# Patient Record
Sex: Female | Born: 1945 | Race: Black or African American | Hispanic: No | Smoking: Never smoker
Health system: Southern US, Community
[De-identification: ages and names within clinical notes are randomized; demographics above are authoritative.]

## PROBLEM LIST (undated history)

## (undated) DIAGNOSIS — M1990 Unspecified osteoarthritis, unspecified site: Secondary | ICD-10-CM

## (undated) DIAGNOSIS — K219 Gastro-esophageal reflux disease without esophagitis: Secondary | ICD-10-CM

## (undated) DIAGNOSIS — D649 Anemia, unspecified: Secondary | ICD-10-CM

## (undated) DIAGNOSIS — D219 Benign neoplasm of connective and other soft tissue, unspecified: Secondary | ICD-10-CM

## (undated) DIAGNOSIS — E7800 Pure hypercholesterolemia, unspecified: Secondary | ICD-10-CM

## (undated) DIAGNOSIS — K589 Irritable bowel syndrome without diarrhea: Secondary | ICD-10-CM

## (undated) DIAGNOSIS — K2970 Gastritis, unspecified, without bleeding: Secondary | ICD-10-CM

## (undated) DIAGNOSIS — K5730 Diverticulosis of large intestine without perforation or abscess without bleeding: Secondary | ICD-10-CM

## (undated) DIAGNOSIS — K222 Esophageal obstruction: Secondary | ICD-10-CM

## (undated) DIAGNOSIS — R768 Other specified abnormal immunological findings in serum: Secondary | ICD-10-CM

## (undated) DIAGNOSIS — K449 Diaphragmatic hernia without obstruction or gangrene: Secondary | ICD-10-CM

## (undated) DIAGNOSIS — I10 Essential (primary) hypertension: Secondary | ICD-10-CM

## (undated) HISTORY — DX: Other specified abnormal immunological findings in serum: R76.8

## (undated) HISTORY — PX: CHOLECYSTECTOMY: SHX55

## (undated) HISTORY — DX: Anemia, unspecified: D64.9

## (undated) HISTORY — DX: Gastritis, unspecified, without bleeding: K29.70

## (undated) HISTORY — DX: Diverticulosis of large intestine without perforation or abscess without bleeding: K57.30

## (undated) HISTORY — DX: Pure hypercholesterolemia, unspecified: E78.00

## (undated) HISTORY — PX: UPPER GASTROINTESTINAL ENDOSCOPY: SHX188

## (undated) HISTORY — DX: Essential (primary) hypertension: I10

## (undated) HISTORY — PX: KNEE SURGERY: SHX244

## (undated) HISTORY — DX: Benign neoplasm of connective and other soft tissue, unspecified: D21.9

## (undated) HISTORY — DX: Unspecified osteoarthritis, unspecified site: M19.90

## (undated) HISTORY — DX: Esophageal obstruction: K22.2

## (undated) HISTORY — DX: Irritable bowel syndrome, unspecified: K58.9

## (undated) HISTORY — DX: Gastro-esophageal reflux disease without esophagitis: K21.9

## (undated) HISTORY — DX: Diaphragmatic hernia without obstruction or gangrene: K44.9

---

## 1983-06-17 HISTORY — PX: ABDOMINAL HYSTERECTOMY: SHX81

## 1999-12-10 ENCOUNTER — Encounter: Admission: RE | Admit: 1999-12-10 | Discharge: 1999-12-10 | Payer: Self-pay | Admitting: Obstetrics and Gynecology

## 1999-12-10 ENCOUNTER — Encounter: Payer: Self-pay | Admitting: *Deleted

## 2000-11-05 ENCOUNTER — Other Ambulatory Visit: Admission: RE | Admit: 2000-11-05 | Discharge: 2000-11-05 | Payer: Self-pay | Admitting: *Deleted

## 2000-12-10 ENCOUNTER — Encounter: Payer: Self-pay | Admitting: *Deleted

## 2000-12-10 ENCOUNTER — Ambulatory Visit (HOSPITAL_COMMUNITY): Admission: RE | Admit: 2000-12-10 | Discharge: 2000-12-10 | Payer: Self-pay | Admitting: Obstetrics

## 2001-05-10 ENCOUNTER — Other Ambulatory Visit: Admission: RE | Admit: 2001-05-10 | Discharge: 2001-05-10 | Payer: Self-pay | Admitting: Gynecology

## 2001-05-31 ENCOUNTER — Encounter: Payer: Self-pay | Admitting: Gynecology

## 2001-05-31 ENCOUNTER — Encounter: Admission: RE | Admit: 2001-05-31 | Discharge: 2001-05-31 | Payer: Self-pay | Admitting: Gynecology

## 2001-12-23 ENCOUNTER — Encounter: Admission: RE | Admit: 2001-12-23 | Discharge: 2001-12-23 | Payer: Self-pay | Admitting: *Deleted

## 2001-12-23 ENCOUNTER — Encounter: Payer: Self-pay | Admitting: *Deleted

## 2002-06-03 ENCOUNTER — Other Ambulatory Visit: Admission: RE | Admit: 2002-06-03 | Discharge: 2002-06-03 | Payer: Self-pay | Admitting: Gynecology

## 2002-06-24 ENCOUNTER — Encounter: Payer: Self-pay | Admitting: *Deleted

## 2002-06-24 ENCOUNTER — Encounter: Admission: RE | Admit: 2002-06-24 | Discharge: 2002-06-24 | Payer: Self-pay | Admitting: *Deleted

## 2002-12-29 ENCOUNTER — Encounter: Payer: Self-pay | Admitting: Gynecology

## 2002-12-29 ENCOUNTER — Ambulatory Visit (HOSPITAL_COMMUNITY): Admission: RE | Admit: 2002-12-29 | Discharge: 2002-12-29 | Payer: Self-pay | Admitting: Gynecology

## 2003-06-27 ENCOUNTER — Other Ambulatory Visit: Admission: RE | Admit: 2003-06-27 | Discharge: 2003-06-27 | Payer: Self-pay | Admitting: Gynecology

## 2004-01-01 ENCOUNTER — Ambulatory Visit (HOSPITAL_COMMUNITY): Admission: RE | Admit: 2004-01-01 | Discharge: 2004-01-01 | Payer: Self-pay | Admitting: Gynecology

## 2004-06-18 ENCOUNTER — Ambulatory Visit: Payer: Self-pay | Admitting: Gastroenterology

## 2004-06-28 ENCOUNTER — Other Ambulatory Visit: Admission: RE | Admit: 2004-06-28 | Discharge: 2004-06-28 | Payer: Self-pay | Admitting: Gynecology

## 2004-07-23 ENCOUNTER — Ambulatory Visit: Payer: Self-pay | Admitting: Gastroenterology

## 2005-01-02 ENCOUNTER — Ambulatory Visit (HOSPITAL_COMMUNITY): Admission: RE | Admit: 2005-01-02 | Discharge: 2005-01-02 | Payer: Self-pay | Admitting: General Surgery

## 2005-01-14 ENCOUNTER — Encounter: Admission: RE | Admit: 2005-01-14 | Discharge: 2005-01-14 | Payer: Self-pay | Admitting: General Surgery

## 2005-04-10 ENCOUNTER — Ambulatory Visit: Payer: Self-pay | Admitting: Gastroenterology

## 2005-04-30 ENCOUNTER — Ambulatory Visit: Payer: Self-pay | Admitting: Gastroenterology

## 2005-08-15 ENCOUNTER — Other Ambulatory Visit: Admission: RE | Admit: 2005-08-15 | Discharge: 2005-08-15 | Payer: Self-pay | Admitting: Gynecology

## 2005-08-29 ENCOUNTER — Ambulatory Visit: Payer: Self-pay | Admitting: Gastroenterology

## 2005-09-03 ENCOUNTER — Ambulatory Visit: Payer: Self-pay | Admitting: Gastroenterology

## 2005-09-03 DIAGNOSIS — K222 Esophageal obstruction: Secondary | ICD-10-CM

## 2005-09-03 HISTORY — DX: Esophageal obstruction: K22.2

## 2006-01-06 ENCOUNTER — Ambulatory Visit (HOSPITAL_COMMUNITY): Admission: RE | Admit: 2006-01-06 | Discharge: 2006-01-06 | Payer: Self-pay | Admitting: Gynecology

## 2006-05-21 ENCOUNTER — Ambulatory Visit: Payer: Self-pay | Admitting: Gastroenterology

## 2006-06-23 ENCOUNTER — Ambulatory Visit: Payer: Self-pay | Admitting: Gastroenterology

## 2006-07-08 ENCOUNTER — Ambulatory Visit: Payer: Self-pay | Admitting: Gastroenterology

## 2006-07-08 LAB — CONVERTED CEMR LAB
AFP-Tumor Marker: 6.6 ng/mL (ref 0.0–8.0)
Tissue Transglutaminase Ab, IgA: <3 units (ref <5)

## 2006-07-10 ENCOUNTER — Encounter: Payer: Self-pay | Admitting: Gastroenterology

## 2006-07-16 ENCOUNTER — Ambulatory Visit: Payer: Self-pay | Admitting: Gastroenterology

## 2006-07-16 LAB — CONVERTED CEMR LAB
ALT: 58 units/L — ABNORMAL HIGH (ref 0–40)
AST: 45 units/L — ABNORMAL HIGH (ref 0–37)
Albumin: 3.6 g/dL (ref 3.5–5.2)
Alkaline Phosphatase: 96 units/L (ref 39–117)
Bilirubin, Direct: 0.2 mg/dL (ref 0.0–0.3)
CO2: 24 meq/L (ref 19–32)
Chloride: 108 meq/L (ref 96–112)
Potassium: 4.1 meq/L (ref 3.5–5.1)
Sodium: 141 meq/L (ref 135–145)
Total Bilirubin: 0.7 mg/dL (ref 0.3–1.2)
Total Protein: 7.5 g/dL (ref 6.0–8.3)

## 2006-08-05 ENCOUNTER — Ambulatory Visit: Payer: Self-pay | Admitting: Gastroenterology

## 2006-08-05 LAB — CONVERTED CEMR LAB
Ceruloplasmin: 32 mg/dL (ref 21–63)
Ferritin: 24.6 ng/mL (ref 10.0–291.0)
HCV Ab: NEGATIVE
Hepatitis B Surface Ag: NEGATIVE
Iron: 63 ug/dL (ref 42–145)
Rhuematoid fact SerPl-aCnc: 37.2 intl units/mL — ABNORMAL HIGH (ref 0.0–20.0)
Saturation Ratios: 13.2 % — ABNORMAL LOW (ref 20.0–50.0)
Sed Rate: 29 mm/hr — ABNORMAL HIGH (ref 0–25)
Tissue Transglutaminase Ab, IgA: <3 units (ref <5)
Transferrin: 340.5 mg/dL (ref >=212.0)

## 2006-08-06 ENCOUNTER — Encounter: Payer: Self-pay | Admitting: Gastroenterology

## 2006-08-06 LAB — CONVERTED CEMR LAB
ANA Titer 1: 1:640 {titer} — ABNORMAL HIGH
Anti Nuclear Antibody(ANA): POSITIVE — AB

## 2006-08-28 ENCOUNTER — Ambulatory Visit: Payer: Self-pay | Admitting: Internal Medicine

## 2006-09-08 ENCOUNTER — Ambulatory Visit: Payer: Self-pay | Admitting: Gastroenterology

## 2006-09-08 LAB — CONVERTED CEMR LAB
ALT: 51 units/L — ABNORMAL HIGH (ref 0–40)
AST: 38 units/L — ABNORMAL HIGH (ref 0–37)
Albumin: 3.6 g/dL (ref 3.5–5.2)
Alkaline Phosphatase: 92 units/L (ref 39–117)
Bilirubin, Direct: 0.1 mg/dL (ref 0.0–0.3)
Total Bilirubin: 0.5 mg/dL (ref 0.3–1.2)
Total Protein: 7.9 g/dL (ref 6.0–8.3)

## 2006-09-14 ENCOUNTER — Ambulatory Visit: Payer: Self-pay | Admitting: Internal Medicine

## 2006-10-01 ENCOUNTER — Other Ambulatory Visit: Admission: RE | Admit: 2006-10-01 | Discharge: 2006-10-01 | Payer: Self-pay | Admitting: Obstetrics and Gynecology

## 2006-11-23 ENCOUNTER — Ambulatory Visit: Payer: Self-pay | Admitting: Gastroenterology

## 2006-11-23 LAB — CONVERTED CEMR LAB
ALT: 48 units/L — ABNORMAL HIGH (ref 0–40)
AST: 38 units/L — ABNORMAL HIGH (ref 0–37)
Albumin: 3.5 g/dL (ref 3.5–5.2)
Alkaline Phosphatase: 87 units/L (ref 39–117)
Bilirubin, Direct: 0.1 mg/dL (ref 0.0–0.3)
Total Bilirubin: 0.5 mg/dL (ref 0.3–1.2)
Total Protein: 7.7 g/dL (ref 6.0–8.3)

## 2006-11-26 ENCOUNTER — Ambulatory Visit: Payer: Self-pay | Admitting: Gastroenterology

## 2007-01-08 ENCOUNTER — Ambulatory Visit (HOSPITAL_COMMUNITY): Admission: RE | Admit: 2007-01-08 | Discharge: 2007-01-08 | Payer: Self-pay | Admitting: Obstetrics and Gynecology

## 2007-06-07 ENCOUNTER — Telehealth (INDEPENDENT_AMBULATORY_CARE_PROVIDER_SITE_OTHER): Payer: Self-pay | Admitting: *Deleted

## 2007-06-08 ENCOUNTER — Ambulatory Visit: Payer: Self-pay | Admitting: Internal Medicine

## 2007-06-08 DIAGNOSIS — K219 Gastro-esophageal reflux disease without esophagitis: Secondary | ICD-10-CM | POA: Insufficient documentation

## 2007-06-08 DIAGNOSIS — E119 Type 2 diabetes mellitus without complications: Secondary | ICD-10-CM | POA: Insufficient documentation

## 2007-06-08 DIAGNOSIS — I10 Essential (primary) hypertension: Secondary | ICD-10-CM | POA: Insufficient documentation

## 2007-06-08 DIAGNOSIS — E785 Hyperlipidemia, unspecified: Secondary | ICD-10-CM | POA: Insufficient documentation

## 2007-06-08 DIAGNOSIS — M65839 Other synovitis and tenosynovitis, unspecified forearm: Secondary | ICD-10-CM | POA: Insufficient documentation

## 2007-06-08 DIAGNOSIS — K589 Irritable bowel syndrome without diarrhea: Secondary | ICD-10-CM | POA: Insufficient documentation

## 2007-06-08 DIAGNOSIS — K222 Esophageal obstruction: Secondary | ICD-10-CM | POA: Insufficient documentation

## 2007-06-08 DIAGNOSIS — F411 Generalized anxiety disorder: Secondary | ICD-10-CM | POA: Insufficient documentation

## 2007-06-08 DIAGNOSIS — M65849 Other synovitis and tenosynovitis, unspecified hand: Secondary | ICD-10-CM | POA: Insufficient documentation

## 2007-06-08 DIAGNOSIS — M25519 Pain in unspecified shoulder: Secondary | ICD-10-CM | POA: Insufficient documentation

## 2007-09-23 ENCOUNTER — Ambulatory Visit: Payer: Self-pay | Admitting: Gastroenterology

## 2007-09-23 LAB — CONVERTED CEMR LAB
ALT: 43 units/L — ABNORMAL HIGH (ref 0–35)
AST: 35 units/L (ref 0–37)
Albumin: 3.9 g/dL (ref 3.5–5.2)
Alkaline Phosphatase: 88 units/L (ref 39–117)
Amylase: 157 units/L — ABNORMAL HIGH (ref 27–131)
BUN: 18 mg/dL (ref 6–23)
Basophils Absolute: 0.1 10*3/uL (ref 0.0–0.1)
Basophils Relative: 1.8 % — ABNORMAL HIGH (ref 0.0–1.0)
Bilirubin Urine: NEGATIVE
Bilirubin, Direct: 0.1 mg/dL (ref 0.0–0.3)
CO2: 29 meq/L (ref 19–32)
CRP, High Sensitivity: 0.4
Calcium: 9.4 mg/dL (ref 8.4–10.5)
Chloride: 105 meq/L (ref 96–112)
Creatinine, Ser: 0.9 mg/dL (ref 0.4–1.2)
Crystals: NEGATIVE
Eosinophils Absolute: 0.1 10*3/uL (ref 0.0–0.7)
Eosinophils Relative: 3.2 % (ref 0.0–5.0)
GFR calc Af Amer: 82 mL/min
GFR calc non Af Amer: 67 mL/min
Glucose, Bld: 98 mg/dL (ref 70–99)
HCT: 40.5 % (ref 36.0–46.0)
Hemoglobin, Urine: NEGATIVE
Hemoglobin: 12.8 g/dL (ref 12.0–15.0)
Ketones, ur: NEGATIVE mg/dL
Leukocytes, UA: NEGATIVE
Lipase: 35 units/L (ref 11.0–59.0)
Lymphocytes Relative: 44 % (ref 12.0–46.0)
MCHC: 31.6 g/dL (ref 30.0–36.0)
MCV: 84.7 fL (ref 78.0–100.0)
Monocytes Absolute: 0.1 10*3/uL (ref 0.1–1.0)
Monocytes Relative: 4 % (ref 3.0–12.0)
Mucus, UA: NEGATIVE
Neutro Abs: 1.5 10*3/uL (ref 1.4–7.7)
Neutrophils Relative %: 47 % (ref 43.0–77.0)
Nitrite: NEGATIVE
Platelets: 193 10*3/uL (ref 150–400)
Potassium: 4.1 meq/L (ref 3.5–5.1)
RBC / HPF: NONE SEEN
RBC: 4.78 M/uL (ref 3.87–5.11)
RDW: 13.2 % (ref 11.5–14.6)
Sed Rate: 36 mm/hr — ABNORMAL HIGH (ref 0–22)
Sodium: 143 meq/L (ref 135–145)
Specific Gravity, Urine: >=1.03 (ref 1.000–1.03)
TSH: 0.84 microintl units/mL (ref 0.35–5.50)
Total Bilirubin: 0.5 mg/dL (ref 0.3–1.2)
Total Protein, Urine: NEGATIVE mg/dL
Total Protein: 8.4 g/dL — ABNORMAL HIGH (ref 6.0–8.3)
Urine Glucose: NEGATIVE mg/dL
Urobilinogen, UA: 0.2 (ref 0.0–1.0)
WBC: 3.2 10*3/uL — ABNORMAL LOW (ref 4.5–10.5)
pH: 5.5 (ref 5.0–8.0)

## 2007-09-28 ENCOUNTER — Ambulatory Visit: Payer: Self-pay | Admitting: Cardiology

## 2007-10-11 ENCOUNTER — Telehealth: Payer: Self-pay | Admitting: Gastroenterology

## 2007-10-28 DIAGNOSIS — K2990 Gastroduodenitis, unspecified, without bleeding: Secondary | ICD-10-CM | POA: Insufficient documentation

## 2007-10-28 DIAGNOSIS — K449 Diaphragmatic hernia without obstruction or gangrene: Secondary | ICD-10-CM | POA: Insufficient documentation

## 2007-10-28 DIAGNOSIS — K2970 Gastritis, unspecified, without bleeding: Secondary | ICD-10-CM | POA: Insufficient documentation

## 2007-10-28 DIAGNOSIS — M1990 Unspecified osteoarthritis, unspecified site: Secondary | ICD-10-CM | POA: Insufficient documentation

## 2007-10-28 DIAGNOSIS — I119 Hypertensive heart disease without heart failure: Secondary | ICD-10-CM | POA: Insufficient documentation

## 2007-11-17 ENCOUNTER — Telehealth: Payer: Self-pay | Admitting: Gastroenterology

## 2007-12-01 ENCOUNTER — Ambulatory Visit: Payer: Self-pay | Admitting: Gastroenterology

## 2007-12-15 ENCOUNTER — Encounter: Payer: Self-pay | Admitting: Gastroenterology

## 2007-12-15 ENCOUNTER — Ambulatory Visit: Payer: Self-pay | Admitting: Gastroenterology

## 2007-12-15 HISTORY — PX: COLONOSCOPY: SHX174

## 2007-12-20 ENCOUNTER — Encounter: Payer: Self-pay | Admitting: Gastroenterology

## 2007-12-30 DIAGNOSIS — K5730 Diverticulosis of large intestine without perforation or abscess without bleeding: Secondary | ICD-10-CM | POA: Insufficient documentation

## 2007-12-31 ENCOUNTER — Ambulatory Visit: Payer: Self-pay | Admitting: Gastroenterology

## 2008-01-12 ENCOUNTER — Ambulatory Visit (HOSPITAL_COMMUNITY): Admission: RE | Admit: 2008-01-12 | Discharge: 2008-01-12 | Payer: Self-pay | Admitting: Obstetrics and Gynecology

## 2008-03-21 ENCOUNTER — Ambulatory Visit: Payer: Self-pay | Admitting: Obstetrics and Gynecology

## 2008-03-21 ENCOUNTER — Encounter: Payer: Self-pay | Admitting: Obstetrics and Gynecology

## 2008-03-21 ENCOUNTER — Other Ambulatory Visit: Admission: RE | Admit: 2008-03-21 | Discharge: 2008-03-21 | Payer: Self-pay | Admitting: Obstetrics and Gynecology

## 2008-04-12 ENCOUNTER — Encounter: Payer: Self-pay | Admitting: Gastroenterology

## 2008-04-18 ENCOUNTER — Ambulatory Visit: Payer: Self-pay | Admitting: Obstetrics and Gynecology

## 2008-05-03 ENCOUNTER — Ambulatory Visit: Payer: Self-pay | Admitting: Obstetrics and Gynecology

## 2008-09-08 ENCOUNTER — Telehealth: Payer: Self-pay | Admitting: Gastroenterology

## 2008-10-12 ENCOUNTER — Telehealth: Payer: Self-pay | Admitting: Gastroenterology

## 2009-01-16 ENCOUNTER — Ambulatory Visit (HOSPITAL_COMMUNITY): Admission: RE | Admit: 2009-01-16 | Discharge: 2009-01-16 | Payer: Self-pay | Admitting: Obstetrics and Gynecology

## 2009-01-19 ENCOUNTER — Encounter: Admission: RE | Admit: 2009-01-19 | Discharge: 2009-01-19 | Payer: Self-pay | Admitting: Obstetrics and Gynecology

## 2009-03-22 ENCOUNTER — Ambulatory Visit: Payer: Self-pay | Admitting: Obstetrics and Gynecology

## 2009-03-22 ENCOUNTER — Encounter: Payer: Self-pay | Admitting: Obstetrics and Gynecology

## 2009-03-22 ENCOUNTER — Other Ambulatory Visit: Admission: RE | Admit: 2009-03-22 | Discharge: 2009-03-22 | Payer: Self-pay | Admitting: Obstetrics and Gynecology

## 2009-07-20 ENCOUNTER — Encounter: Admission: RE | Admit: 2009-07-20 | Discharge: 2009-07-20 | Payer: Self-pay | Admitting: Obstetrics and Gynecology

## 2010-01-17 ENCOUNTER — Encounter: Admission: RE | Admit: 2010-01-17 | Discharge: 2010-01-17 | Payer: Self-pay | Admitting: Obstetrics and Gynecology

## 2010-01-31 ENCOUNTER — Telehealth: Payer: Self-pay | Admitting: Gastroenterology

## 2010-03-25 ENCOUNTER — Other Ambulatory Visit: Admission: RE | Admit: 2010-03-25 | Discharge: 2010-03-25 | Payer: Self-pay | Admitting: Obstetrics and Gynecology

## 2010-03-25 ENCOUNTER — Ambulatory Visit: Payer: Self-pay | Admitting: Obstetrics and Gynecology

## 2010-04-02 ENCOUNTER — Ambulatory Visit: Payer: Self-pay | Admitting: Obstetrics and Gynecology

## 2010-04-05 ENCOUNTER — Telehealth: Payer: Self-pay | Admitting: Gastroenterology

## 2010-04-25 ENCOUNTER — Ambulatory Visit: Payer: Self-pay | Admitting: Gastroenterology

## 2010-05-01 ENCOUNTER — Telehealth: Payer: Self-pay | Admitting: Gastroenterology

## 2010-05-06 ENCOUNTER — Telehealth: Payer: Self-pay | Admitting: Gastroenterology

## 2010-06-26 ENCOUNTER — Telehealth: Payer: Self-pay | Admitting: Gastroenterology

## 2010-06-27 ENCOUNTER — Ambulatory Visit
Admission: RE | Admit: 2010-06-27 | Discharge: 2010-06-27 | Payer: Self-pay | Source: Home / Self Care | Attending: Gastroenterology | Admitting: Gastroenterology

## 2010-06-27 ENCOUNTER — Encounter (INDEPENDENT_AMBULATORY_CARE_PROVIDER_SITE_OTHER): Payer: Self-pay | Admitting: *Deleted

## 2010-07-02 ENCOUNTER — Telehealth: Payer: Self-pay | Admitting: Gastroenterology

## 2010-07-09 ENCOUNTER — Telehealth: Payer: Self-pay | Admitting: Gastroenterology

## 2010-07-15 ENCOUNTER — Telehealth: Payer: Self-pay | Admitting: Gastroenterology

## 2010-07-16 NOTE — Letter (Signed)
Summary: Patient [facility] Biopsy Results  [facility]  [address] [place], [place] [address]   Phone: [phone]  Fax: [phone]        December 20, 2007 MRN: [id number]    [place] [address] [place], [place]  [address]    Dear Ms. Hutsell,  I am pleased to inform you that the biopsies taken during your recent endoscopic examination did not show any evidence of cancer upon pathologic examination.  Additional information/recommendations:  __No further action is needed at this time.  Please follow-up with      your primary care physician for your other healthcare needs.  __ Please call [phone] to schedule a return visit to review      your condition.  _xx_ Continue with the treatment plan as outlined on the day of your      exam.  __ You should have a repeat endoscopic examination for thi_s problem              in _ months/years.   Please call us if you are having persistent problems or have questions about your condition that have not been fully answered at this time.  Sincerely,  Mardella Layman MD [facility]  This letter has been electronically signed by your physician.

## 2010-07-16 NOTE — Miscellaneous (Signed)
Summary: Bentyl refill  Clinical Lists Changes  Medications: Added new medication of BENTYL 10 MG  CAPS (DICYCLOMINE HCL) take one by mouth three times a day - Signed Rx of BENTYL 10 MG  CAPS (DICYCLOMINE HCL) take one by mouth three times a day;  #270 x 1;  Signed;  Entered by: Harlow Mares CMA;  Authorized by: Mardella Layman MD [facility];  Method used: Electronically to [facility]  [address] [phone]*, [address] [place], [place]  [address], Ph: [phone] or [phone], Fax: [phone]    Prescriptions: BENTYL 10 MG  CAPS (DICYCLOMINE HCL) take one by mouth three times a day  #270 x 1   Entered by:   Harlow Mares CMA   Authorized by:   Mardella Layman MD FACG,FAGA   Signed by:   Harlow Mares CMA on 04/12/2008   Method used:   Electronically to        [facility]  [address] [phone]* (retail)       [address]       [place], [place]  [address]       Ph: [phone] or [phone]       Fax: [phone]   RxID:   [id number]

## 2010-07-16 NOTE — Procedures (Signed)
Summary: Gastroenterology COLON  Gastroenterology COLON   Imported By: Irma Newness 11/05/2007 14:00:45  _____________________________________________________________________  External Attachment:    Type:   Image     Comment:   External Document

## 2010-07-16 NOTE — Progress Notes (Signed)
Summary: refill  Phone Note Call from Patient Call back at Work Phone [phone]   Caller: Patient Call For: Lexine Jaspers Reason for Call: Refill Medication Summary of Call: Patient needs refill on her Kapidex [facility] [address] Initial call taken by: Tawni Levy,  September 08, 2008 8:40 AM  Follow-up for Phone Call        rx sent patient aware.  Follow-up by: Harlow Mares CMA,  September 08, 2008 8:43 AM      Prescriptions: KAPIDEX 60 MG  CPDR (DEXLANSOPRAZOLE) Take one capsule by mouth 30 minutes before breakfast  #30 x 11   Entered by:   Harlow Mares CMA   Authorized by:   Mardella Layman MD FACG,FAGA   Signed by:   Harlow Mares CMA on 09/08/2008   Method used:   [facility]ronically to        [facility]  [address] [phone]* (retail)       [address]       [place], [place]  [address]       Ph: [phone] or [phone]       Fax: [phone]   RxID:   [id number]

## 2010-07-16 NOTE — Assessment & Plan Note (Signed)
Summary: rev follow up hh / maw   History of Present Illness Visit Type: follow up Primary GI MD: Sheryn Bison MD FACP FAGA Primary Provider: Oliver Barre, M.D. Chief Complaint: f/u H/H  no complaints History of Present Illness:   This patient is much improved on kapidex 60 mg a day for acid reflux confirm our recent endoscopy with multiple esophageal and gastric biopsies. There was no evidence of H. pylori on gastric biopsy examination. She is completely asymptomatic at this time. I discussed with the patient the possibility of future fundoplication depending on clinical course we'll see as she does currently on kapidex. She otherwise denies gastrointestinal problems. She is status post laparoscopic cholecystectomy in July of 1993.   GI Review of Systems      Denies abdominal pain, acid reflux, belching, bloating, chest pain, dysphagia with liquids, dysphagia with solids, heartburn, loss of appetite, nausea, vomiting, vomiting blood, weight loss, and  weight gain.        Denies anal fissure, black tarry stools, change in bowel habit, constipation, diarrhea, diverticulosis, fecal incontinence, heme positive stool, hemorrhoids, irritable bowel syndrome, jaundice, light color stool, liver problems, rectal bleeding, and  rectal pain.     Prior Medications Reviewed Using: Patient Recall  Updated Prior Medication List: DICLOFENAC SODIUM 75 MG  TBEC (DICLOFENAC SODIUM) 1 by mouth two times a day prn LEVSIN 0.125 MG  TABS (HYOSCYAMINE SULFATE) 1 tablet by mouth as needed KLOR-CON 10 10 MEQ  TBCR (POTASSIUM CHLORIDE) 1 by mouth qd BENICAR HCT 40-12.5 MG  TABS (OLMESARTAN MEDOXOMIL-HCTZ) 1 by mouth qd VERAPAMIL HCL CR 240 MG  CP24 (VERAPAMIL HCL) 1 by mouth qd ECOTRIN LOW STRENGTH 81 MG  TBEC (ASPIRIN) 1 by mouth qd KAPIDEX 60 MG  CPDR (DEXLANSOPRAZOLE) Take one capsule by mouth 30 minutes before breakfast ACTOPLUS MET 15-850 MG  TABS (PIOGLITAZONE HCL-METFORMIN HCL) 1 tablet by mouth once  daily MULTIVITAMINS   TABS (MULTIPLE VITAMIN) 1 tablet by mouth once daily  Current Allergies (reviewed today): CODEINE PHOSPHATE (CODEINE PHOSPHATE)  Past Medical History:    Reviewed history from 06/08/2007 and no changes required:       GERD       Hypertension       Diabetes mellitus, type II       Hyperlipidemia       IBS       Anxiety       esophageal stricture       weakly pos RF  Past Surgical History:    Reviewed history from 06/08/2007 and no changes required:       Cholecystectomy,Laproscopic per Dr. Jamey Ripa in 1993.       partial Hysterectomy   Family History:    Reviewed history and no changes required:       No FH of Colon Cancer:       Family History of Breast Cancer:maternal Aunt       Family History of Diabetes: Mother and brother  Social History:    Reviewed history from 06/08/2007 and no changes required:       Never Smoked       Alcohol use-no    Review of Systems  The patient denies allergy/sinus, anemia, anxiety-new, arthritis/joint pain, back pain, blood in urine, breast changes/lumps, change in vision, confusion, cough, coughing up blood, depression-new, fainting, fatigue, fever, headaches-new, hearing problems, heart murmur, heart rhythm changes, itching, menstrual pain, muscle pains/cramps, night sweats, nosebleeds, pregnancy symptoms, shortness of breath, skin rash, sleeping problems, sore throat,  swelling of feet/legs, swollen lymph glands, thirst - excessive , urination - excessive , urination changes/pain, urine leakage, vision changes, and voice change.     Vital Signs:  Patient Profile:   65 Years Old Female Height:     66 inches Weight:      195.13 pounds BMI:     31.61 BSA:     1.98 Pulse rate:   80 / minute Pulse rhythm:   regular BP sitting:   118 / 76  (left arm)  Vitals Entered By: Milford Cage CMA (December 31, 2007 11:20 AM)                  Physical Exam  General:     Well developed, well nourished, no acute  distress.healthy appearing.      Impression & Recommendations:  Problem # 1:  GERD (ICD-530.81) Assessment: Improved Her acid reflux is much improved on a standard anti-reflex regime and daily kapidex. She has had reflux symptoms for some 20 years. I reviewed her reflex regime with her and the possibility that on the plication may be indicated for recurrent stricturing and refractory symptoms. For now, we'll see she does symptomatically with p.r.n. followup while continuing daily PPI therapy.  Problem # 2:  IBS (ICD-564.1) Assessment: Improved She has chronic IBS and seems to be doing well with better diabetic control. Recent colonoscopy exam otherwise unremarkable and did show evidence of diverticulosis but no colonic polyposis or inflammatory colonic process.  Problem # 3:  DIABETES MELLITUS, TYPE II (ICD-250.00) Assessment: Improved continue medications per Dr. Oliver Barre. It seems obvious that with better diabetic control her gastrointestinal symptoms are also better control.   Patient Instructions: 1)  Copy Sent To:Dr. Oliver Barre. 2)  Avoid foods high in acid content (tomatoes, citrus juices, spicy foods). Avoid eating within 3 to 4 hours of lying down or before exercising. Do not over eat; try smaller more frequent meals. Elevate head of bed four inches when sleeping.    ]

## 2010-07-16 NOTE — Procedures (Signed)
Summary: EGD   EGD  Procedure date:  12/15/2007  Findings:      Location: [facility]    Patient Name: [place], [place] MRN: [id number] Procedure Procedures: Panendoscopy (EGD) CPT: 43235.    with biopsy(s)/brushing(s). CPT: [id number].  Personnel: Endoscopist: Vania Rea. Jarold Motto, MD.  Exam Location: Exam performed in Outpatient Clinic. Outpatient  Patient Consent: Procedure, Alternatives, Risks and Benefits discussed, consent obtained, from patient. Consent was obtained by the RN.  Indications Symptoms: Abdominal pain, location: diffuse. Reflux symptoms for 6-10 yrs,  History  Current Medications: Patient is taking a non-steroidal medication. Patient is not currently taking Coumadin.  Medical/Surgical History: Adult Onset Diabetes, Hypertension, Reflux Disease, Hyperlipidemia, Irritable Bowel Syndrome,  Pre-Exam Physical: Performed Dec 15, 2007  Entire physical exam was normal. Cardio- pulmonary exam, Abdominal exam, Extremity exam, Mental status exam WNL.  Comments: Pt. history reviewed/updated, physical exam performed prior to initiation of sedation? yes Exam Exam Info: Maximum depth of insertion Duodenum, intended Duodenum. Patient position: on left side. Duration of exam: 10 minutes. Vocal cords visualized. Gastric retroflexion performed. Images taken. ASA Classification: II. Tolerance: fair, adequate exam.  Sedation Meds: Patient assessed and found to be appropriate for moderate (conscious) sedation. Sedation was managed by the Endoscopist.  Monitoring: BP and pulse monitoring done. Oximetry used. Supplemental O2 given at 2 Liters.  Instrument(s): GIF 160. Serial [id number].   Findings - HIATAL HERNIA: Prolapsing, 6 cms. in length. ICD9: Hernia, Hiatal: 553.3.Comments: FREE PROFUSE REFLUX NOTED....  - ESOPHAGEAL INFLAMMATION: as a result of reflux. Length of inflammation: 5 cm. [place] Classification: Grade A. ICD9: Esophagitis, Reflux: 530.11.   - Normal: Pyloric Sphincter to Duodenal 2nd Portion. Not Seen: Ulcer. Mucosal abnormality.  - MUCOSAL ABNORMALITY: Body to Antrum. Nodularity present. Red spots present. Granular mucosa. Biopsy/Mucosal Abn taken. RUT done, results pending. ICD9: Gastritis, Unspecified: 535.50. Comment: R/O H.PYLORI...   Assessment  Diagnoses: 553.3: Hernia, Hiatal.  535.50: Gastritis, Unspecified. R/O H.pylori.  530.11: Esophagitis, Reflux. GERD.   Events  Unplanned Intervention: No unplanned interventions were required.  Plans Medication(s): Await pathology. Other: Kapidex 60mg  QAM, starting Dec 15, 2007 for indefinitely.   Patient Education: Patient given standard instructions for: Reflux.  Comments: If ++ HP would treat......Marland Kitchen Disposition: After procedure patient sent to recovery. After recovery patient sent home.  Scheduling: Await pathology to schedule patient. Follow-up prn.   cc: Corwin Levins M.D.     Emiliano Dyer M.D.     Edyth Gunnels M.D.     REPORT OF SURGICAL PATHOLOGY   Case #: [id number] Patient Name: [place], [place]. Office Chart Number:  N/A   MRN: [id number] Pathologist: Ferd Hibbs. Colonel Bald, MD DOB/Age  Jul 31, 1945 (Age: 65)    Gender: F Date Taken:  12/15/2007 Date Received: 12/15/2007   FINAL DIAGNOSIS   ***MICROSCOPIC EXAMINATION AND DIAGNOSIS***   STOMACH, RANDOM, BIOPSY:   - MILD CHRONIC GASTRITIS.  NO HELICOBACTER PYLORI, DYSPLASIA OR EVIDENCE OF MALIGNANCY IDENTIFIED.   COMMENT   A Warthin-Starry stain is performed to determine the possibility of the presence of Helicobacter pylori. The Warthin-Starry stain is negative for organisms of Helicobacter pylori. The control(s) stained appropriately.   cc Date Reported:  12/16/2007     [place] Booty B. Colonel Bald, MD *** Electronically Signed Out By [facility] ***     December 20, 2007 MRN: [id number]    [place] [address] [place], [place]  [address]    Dear Ms. Kozakiewicz,  I am pleased to inform you that  the biopsies  taken during your recent endoscopic examination did not show any evidence of cancer upon pathologic examination.  Additional information/recommendations:  __No further action is needed at this time.  Please follow-up with      your primary care physician for your other healthcare needs.  __ Please call [phone] to schedule a return visit to review      your condition.  _xx_ Continue with the treatment plan as outlined on the day of your      exam.  __ You should have a repeat endoscopic examination for thi_s problem              in _ months/years.   Please call us if you are having persistent problems or have questions about your condition that have not been fully answered at this time.  Sincerely,  Mardella Layman MD [facility]  This letter has been electronically signed by your physician.   This report was created from the original endoscopy report, which was reviewed and signed by the above listed endoscopist.

## 2010-07-16 NOTE — Procedures (Signed)
Summary: Colonoscopy   Colonoscopy  Procedure date:  12/15/2007  Findings:      Location:  [facility].    Patient Name: [place], [place] MRN: [id number] Procedure Procedures: Colonoscopy CPT: [phone].  Personnel: Endoscopist: Vania Rea. Jarold Motto, MD.  Exam Location: Exam performed in Outpatient Clinic. Outpatient  Patient Consent: Procedure, Alternatives, Risks and Benefits discussed, consent obtained, from patient. Consent was obtained by the RN.  Indications Symptoms: Abdominal pain / bloating.  History  Current Medications: Patient is taking an non-steroidal medication. Patient is not currently taking Coumadin.  Medical/ Surgical History: Adult Onset Diabetes, Hypertension, Reflux Disease, Hyperlipidemia, Irritable Bowel Syndrome,  Comments: S/P CHOLECYSTECTOMY AND TAH... Pre-Exam Physical: Performed Dec 15, 2007. Entire physical exam was normal. Cardio- pulmonary exam, Rectal exam, Abdominal exam, Extremity exam, Mental status exam WNL.  Comments: Pt. history reviewed/updated, physical exam performed prior to initiation of sedation? YES Exam Exam: Extent of exam reached: Cecum, extent intended: Cecum.  The cecum was identified by appendiceal orifice and IC valve. Patient position: on left side. Time to Cecum: 00:03:35. Time for Withdrawl: 00:07:27. Colon retroflexion performed. Images taken. ASA Classification: II. Tolerance: excellent.  Monitoring: Pulse and BP monitoring, Oximetry used. Supplemental O2 given. at 2 Liters.  Colon Prep Used Golytely for colon prep. Prep results: excellent.  Sedation Meds: Patient assessed and found to be appropriate for moderate (conscious) sedation. Sedation was managed by the Endoscopist. Fentanyl 50 mcg. given IV. Versed 7 mg. given IV.  Instrument(s): CF 140L. Serial [id number].  Findings - NORMAL EXAM: Cecum to Splenic Flexure. Not Seen: Polyps. AVM's. Colitis. Tumors. Crohn's. Diverticulosis.  -  DIVERTICULOSIS: Descending Colon to Sigmoid Colon. Not bleeding. ICD9: Diverticulosis, Colon: 562.10. Comments: Scattered small mouthed tics..  - NORMAL EXAM: Sigmoid Colon to Rectum. Tumors. Crohn's. Hemorrhoids.  - HEMORRHOIDS: Internal. Size: Medium. Not bleeding. Not thrombosed. ICD9: Hemorrhoids, Internal: 455.0.   Assessment  Diagnoses: 562.10: Diverticulosis, Colon. IBS.  455.0: Hemorrhoids, Internal.   Events  Unplanned Interventions: No intervention was required.  Plans Medication Plan: Fiber supplements: Methylcellulose 1 Tbsp QAM, starting Dec 15, 2007 for indefinitely.   Patient Education: Patient given standard instructions for: Diverticulosis. high fiber diet.  Disposition: After procedure patient sent to recovery. After recovery patient sent home.  Scheduling/Referral: Follow-Up prn. EGD, to [facility]. Jarold Motto, MD, on Dec 15, 2007.    cc: Oliver Barre M.D.     Emiliano Dyer M.D.     Edyth Gunnels M.D.  This report was created from the original endoscopy report, which was reviewed and signed by the above listed endoscopist.

## 2010-07-16 NOTE — Assessment & Plan Note (Signed)
Summary: PROBLEMS WITH HER ARM PAIN /DD  Medications Added DICLOFENAC SODIUM 75 MG  TBEC (DICLOFENAC SODIUM) 1 by mouth two times a day prn LEVSIN 0.125 MG  TABS (HYOSCYAMINE SULFATE)  KLOR-CON 10 10 MEQ  TBCR (POTASSIUM CHLORIDE) 1 by mouth qd BENICAR HCT 40-12.5 MG  TABS (OLMESARTAN MEDOXOMIL-HCTZ) 1 by mouth qd VERAPAMIL HCL CR 240 MG  CP24 (VERAPAMIL HCL) 1 by mouth qd ECOTRIN LOW STRENGTH 81 MG  TBEC (ASPIRIN) 1 by mouth qd METFORMIN HCL 500 MG  TABS (METFORMIN HCL) 2 by mouth qd ZEGERID 40-1100 MG  CAPS (OMEPRAZOLE-SODIUM BICARBONATE) 1 by mouth qd ESTRACE 1 MG  TABS (ESTRADIOL) 1 by mouth qd        Preventive Care Screening  Colonoscopy:    Date:  04/30/2005    Next Due:  04/2015    Results:  normal    Vital Signs:  Patient Profile:   65 Years Old Female Weight:      197 pounds Temp:     98.7 degrees F Pulse rate:   85 / minute BP sitting:   152 / 88  (right arm) Cuff size:   regular  Pt. in pain?   no  Vitals Entered By: Maris Berger (June 08, 2007 3:13 PM)                  Chief Complaint:  bursitis right arm.  History of Present Illness: here with diffuse puffiness and swelling of the left hand and distal arm; types all day at work; also some vague right shoulder achiness - has FROM and some improvement today  Current Allergies (reviewed today): CODEINE PHOSPHATE (CODEINE PHOSPHATE) Updated/Current Medications (including changes made in today's visit):  DICLOFENAC SODIUM 75 MG  TBEC (DICLOFENAC SODIUM) 1 by mouth two times a day prn LEVSIN 0.125 MG  TABS (HYOSCYAMINE SULFATE)  KLOR-CON 10 10 MEQ  TBCR (POTASSIUM CHLORIDE) 1 by mouth qd BENICAR HCT 40-12.5 MG  TABS (OLMESARTAN MEDOXOMIL-HCTZ) 1 by mouth qd VERAPAMIL HCL CR 240 MG  CP24 (VERAPAMIL HCL) 1 by mouth qd ECOTRIN LOW STRENGTH 81 MG  TBEC (ASPIRIN) 1 by mouth qd METFORMIN HCL 500 MG  TABS (METFORMIN HCL) 2 by mouth qd ZEGERID 40-1100 MG  CAPS (OMEPRAZOLE-SODIUM BICARBONATE) 1  by mouth qd ESTRACE 1 MG  TABS (ESTRADIOL) 1 by mouth qd   Past Medical History:    Reviewed history and no changes required:       GERD       Hypertension       Diabetes mellitus, type II       Hyperlipidemia       IBS       Anxiety       esophageal stricture       weakly pos RF  Past Surgical History:    Reviewed history and no changes required:       Cholecystectomy   Social History:    Reviewed history and no changes required:       Never Smoked       Alcohol use-no   Risk Factors:  Tobacco use:  never Alcohol use:  no  Colonoscopy History:     Date of Last Colonoscopy:  04/30/2005    Results:  normal     Physical Exam  General:     Well-developed,well-nourished,in no acute distress; alert,appropriate and cooperative throughout examination Head:     Normocephalic and atraumatic without obvious abnormalities. No apparent alopecia or balding. Eyes:  No corneal or conjunctival inflammation noted. EOMI. Perrla.  Ears:     External ear exam shows no significant lesions or deformities.  Otoscopic examination reveals clear canals, tympanic membranes are intact bilaterally without bulging, retraction, inflammation or discharge. Hearing is grossly normal bilaterally. Nose:     External nasal examination shows no deformity or inflammation. Nasal mucosa are pink and moist without lesions or exudates. Mouth:     Oral mucosa and oropharynx without lesions or exudates.  Teeth in good repair. Neck:     No deformities, masses, or tenderness noted. Lungs:     Normal respiratory effort, chest expands symmetrically. Lungs are clear to auscultation, no crackles or wheezes. Heart:     Normal rate and regular rhythm. S1 and S2 normal without gallop, murmur, click, rub or other extra sounds. Msk:     some diffuse puffiness and tenderness of left hand diffusely without cellulitis, right shoulder from, NT Extremities:     no edema    Impression &  Recommendations:  Problem # 1:  OTHER TENOSYNOVITIS OF HAND AND WRIST (ICD-727.05) c/w overuse - d/w pt, reassured, educated, tx with nsaid prn  Problem # 2:  SHOULDER PAIN, RIGHT (ICD-719.41)  Her updated medication list for this problem includes:    Diclofenac Sodium 75 Mg Tbec (Diclofenac sodium) .Marland Kitchen... 1 by mouth two times a day prn    Ecotrin Low Strength 81 Mg Tbec (Aspirin) .Marland Kitchen... 1 by mouth qd  exam o/w benign - ok to tx as above, f/u prn  Problem # 3:  HYPERTENSION (ICD-401.9) o/w stable, cont meds as is Her updated medication list for this problem includes:    Benicar Hct 40-12.5 Mg Tabs (Olmesartan medoxomil-hctz) .Marland Kitchen... 1 by mouth qd    Verapamil Hcl Cr 240 Mg Cp24 (Verapamil hcl) .Marland Kitchen... 1 by mouth qd   Problem # 4:  DIABETES MELLITUS, TYPE II (ICD-250.00) cont meds - sees dr Juliane Lack - endo - has f/u next few months Her updated medication list for this problem includes:    Benicar Hct 40-12.5 Mg Tabs (Olmesartan medoxomil-hctz) .Marland Kitchen... 1 by mouth qd    Ecotrin Low Strength 81 Mg Tbec (Aspirin) .Marland Kitchen... 1 by mouth qd    Metformin Hcl 500 Mg Tabs (Metformin hcl) .Marland Kitchen... 2 by mouth qd   Complete Medication List: 1)  Diclofenac Sodium 75 Mg Tbec (Diclofenac sodium) .Marland Kitchen.. 1 by mouth two times a day prn 2)  Levsin 0.125 Mg Tabs (Hyoscyamine sulfate) 3)  Klor-con 10 10 Meq Tbcr (Potassium chloride) .Marland Kitchen.. 1 by mouth qd 4)  Benicar Hct 40-12.5 Mg Tabs (Olmesartan medoxomil-hctz) .Marland Kitchen.. 1 by mouth qd 5)  Verapamil Hcl Cr 240 Mg Cp24 (Verapamil hcl) .Marland Kitchen.. 1 by mouth qd 6)  Ecotrin Low Strength 81 Mg Tbec (Aspirin) .Marland Kitchen.. 1 by mouth qd 7)  Metformin Hcl 500 Mg Tabs (Metformin hcl) .... 2 by mouth qd 8)  Zegerid 40-1100 Mg Caps (Omeprazole-sodium bicarbonate) .Marland Kitchen.. 1 by mouth qd 9)  Estrace 1 Mg Tabs (Estradiol) .Marland Kitchen.. 1 by mouth qd   Patient Instructions: 1)  take the new anti-inflammatory as needed for pain 2)  continue all other medications as prescribed 3)  see dr Lucianne Muss 2/09 as planned for the  diabetes and cholesterol 4)  Please schedule a follow-up appointment as needed.    Prescriptions: DICLOFENAC SODIUM 75 MG  TBEC (DICLOFENAC SODIUM) 1 by mouth two times a day prn  #60 x 5   Entered and Authorized by:   Corwin Levins MD  Signed by:   Corwin Levins MD on 06/08/2007   Method used:   Print then Give to Patient   RxID:   [id number]  ]

## 2010-07-16 NOTE — Progress Notes (Signed)
Summary: SCHED PROCEDURE??  Phone Note Call from Patient Call back at [facility] Phone [phone] Call back at Work Phone [phone]   Caller: Patient Call For: Adryan Shin Summary of Call: Matheu Ploeger PT.  PT ASKING TO SPEAK W/LEISHA.  STATES LEISHA TOLD HER TO CALL WHEN SHE WAS READY TO SCHEDULE A PROCEDURE. ???  PATIENT'S CHART HAS BEEN REQUESTED Initial call taken by: Magdalen Spatz [facility],  November 17, 2007 3:55 PM  Follow-up for Phone Call        Left message on patients machine to call back, schedule an endo/colon Follow-up by: Harlow Mares CMA,  November 17, 2007 4:22 PM

## 2010-07-16 NOTE — Assessment & Plan Note (Signed)
Summary: follow up gerd and change ppi/lk   History of Present Illness Visit Type: follow up Primary GI MD: Sheryn Bison MD FACP FAGA Primary Provider: Oliver Barre, M.D. Requesting Provider: na Chief Complaint: F/u GERD, and pt wants new PPI because Dexilant is to expensive and patient has been taking Omeprazole OTC and states that it is not helping still having GERD History of Present Illness:   Daisy Dillon is doing well from a gastrointestinal standpoint. She comes in today for checkup and also discussion of her medications per her Medicare status. She currently is on omeprazole 20 mg a day and continues with some reflux symptoms. She was much better on Dexilant but cannot afford this drug. She also does well with dicyclomine 10 mg t.i.d. for her IBS.  She currently denies dysphagia, diarrhea, rectal bleeding, melena, or any hepatobiliary complaints. Appetite is good and her weight is stable. She has had previous cholecystectomy.   GI Review of Systems    Reports acid reflux and  heartburn.      Denies abdominal pain, belching, bloating, chest pain, dysphagia with liquids, dysphagia with solids, loss of appetite, nausea, vomiting, vomiting blood, weight loss, and  weight gain.        Denies anal fissure, black tarry stools, change in bowel habit, constipation, diarrhea, diverticulosis, fecal incontinence, heme positive stool, hemorrhoids, irritable bowel syndrome, jaundice, light color stool, liver problems, rectal bleeding, and  rectal pain.    Current Medications (verified): 1)  Benicar Hct 40-12.5 Mg  Tabs (Olmesartan Medoxomil-Hctz) .Marland Kitchen.. 1 By Mouth Qd 2)  Verapamil Hcl Cr 240 Mg  Cp24 (Verapamil Hcl) .Marland Kitchen.. 1 By Mouth Qd 3)  Ecotrin Low Strength 81 Mg  Tbec (Aspirin) .Marland Kitchen.. 1 By Mouth Qd 4)  Actoplus Met 15-850 Mg  Tabs (Pioglitazone Hcl-Metformin Hcl) .Marland Kitchen.. 1 Tablet By Mouth Once Daily 5)  Multivitamins   Tabs (Multiple Vitamin) .Marland Kitchen.. 1 Tablet By Mouth Once Daily 6)  Hyoscyamine Sulfate 0.125  Mg Subl (Hyoscyamine Sulfate) .Marland Kitchen.. 1 Sl Q 4-6 Hrs As Needed 7)  Omeprazole Magnesium 20.6 (20 Base) Mg Cpdr (Omeprazole Magnesium) .... One Tablet By Mouth Once Daily 8)  Vitamin D3 2000 Unit Caps (Cholecalciferol) .... One Capsule By Mouth Once Daily 9)  Magnesium 500 Mg Tabs (Magnesium) .... One Tablet By Mouth Once Daily 10)  Pravastatin Sodium 20 Mg Tabs (Pravastatin Sodium) .... One Tablet By Mouth Once Daily 11)  Triamterene-Hctz 37.5-25 Mg Tabs (Triamterene-Hctz) .... One Tablet By Mouth Once Daily  Allergies (verified): 1)  Codeine Phosphate (Codeine Phosphate)  Past History:  Past medical, surgical, family and social histories (including risk factors) reviewed for relevance to current acute and chronic problems.  Past Medical History: Anxiety esophageal stricture DIVERTICULOSIS, COLON (ICD-562.10) DEGENERATIVE JOINT DISEASE (ICD-715.90) HYPERTENSIVE CARDIOVASCULAR DISEASE (ICD-402.90) GASTRITIS (ICD-535.50) HIATAL HERNIA (ICD-553.3) SHOULDER PAIN, RIGHT (ICD-719.41) OTHER TENOSYNOVITIS OF HAND AND WRIST (ICD-727.05) ESOPHAGEAL STRICTURE (ICD-530.3) IBS (ICD-564.1) ANXIETY (ICD-300.00) HYPERLIPIDEMIA (ICD-272.4) DIABETES MELLITUS, TYPE II (ICD-250.00) HYPERTENSION (ICD-401.9) GERD (ICD-530.81)   weakly pos RF  Past Surgical History: Reviewed history from 12/31/2007 and no changes required. Cholecystectomy,Laproscopic per Dr. Jamey Ripa in 1993. partial Hysterectomy  Family History: Reviewed history from 12/31/2007 and no changes required. No FH of Colon Cancer: Family History of Breast Cancer:maternal Aunt Family History of Diabetes: Mother and brother  Social History: Reviewed history from 06/08/2007 and no changes required. Retired Never Smoked Alcohol use-no  Review of Systems       The patient complains of arthritis/joint pain.  The patient denies allergy/sinus,  anemia, anxiety-new, back pain, blood in urine, breast changes/lumps, change in vision,  confusion, cough, coughing up blood, depression-new, fainting, fatigue, fever, headaches-new, hearing problems, heart murmur, heart rhythm changes, itching, menstrual pain, muscle pains/cramps, night sweats, nosebleeds, pregnancy symptoms, shortness of breath, skin rash, sleeping problems, sore throat, swelling of feet/legs, swollen lymph glands, thirst - excessive , urination - excessive , urination changes/pain, urine leakage, vision changes, and voice change.    Vital Signs:  Patient profile:   65 year old female Height:      66 inches Weight:      195 pounds BMI:     31.59 BSA:     1.98 Pulse rate:   88 / minute Pulse rhythm:   regular BP sitting:   132 / 64  (left arm) Cuff size:   regular  Vitals Entered By: Ok Anis CMA (April 25, 2010 10:48 AM)  Physical Exam  General:  Well developed, well nourished, no acute distress.healthy appearing.   Head:  Normocephalic and atraumatic. Eyes:  PERRLA, no icterus.exam deferred to patient's ophthalmologist.   Lungs:  Clear throughout to auscultation. Heart:  Regular rate and rhythm; no murmurs, rubs,  or bruits. Abdomen:  Soft, nontender and nondistended. No masses, hepatosplenomegaly or hernias noted. Normal bowel sounds. Rectal:  deferred Extremities:  No clubbing, cyanosis, edema or deformities noted. Neurologic:  Alert and  oriented x4;  grossly normal neurologically. Psych:  Alert and cooperative. Normal mood and affect.   Impression & Recommendations:  Problem # 1:  DIVERTICULOSIS, COLON (ICD-562.10) Assessment Improved Continue high-fiber diet as tolerated with t.i.d. dicyclomine 10 mg before meals. She is up-to-date on her colonoscopy exams.  Problem # 2:  GERD (ICD-530.81) Assessment: Deteriorated Generic omeprazole 40 mg twice a day 30 minutes before meals. It appears that she needs b.i.d. therapy to control her symptomatology, and she has a history of recurrent esophageal strictures.  Problem # 3:  SHOULDER PAIN,  RIGHT (ICD-719.41) Assessment: Improved Conventional doses of NSAID should be well tolerated if needed.  Patient Instructions: 1)  Your prescriptions have been sent to your pharmacy for the increase in omeprazole 40mg  two times a day and dicyclomine. 2)  Stop Hyoscyamine.  3)  Copy sent to : Oliver Barre, MD 4)  The medication list was reviewed and reconciled.  All changed / newly prescribed medications were explained.  A complete medication list was provided to the patient / caregiver. 5)  Please continue current medications.  6)  Avoid foods high in acid content ( tomatoes, citrus juices, spicy foods) . Avoid eating within 3 to 4 hours of lying down or before exercising. Do not over eat; try smaller more frequent meals. Elevate head of bed four inches when sleeping.  7)  Diet should be high in fiber ( fruits, vegetables, whole grains) but low in residue. Drink at least eight (8) glasses of water a day.  Prescriptions: DICYCLOMINE HCL 10 MG CAPS (DICYCLOMINE HCL) one capsule by mouth three times a day before meals  #90 x 11   Entered by:   Christie Nottingham CMA (AAMA)   Authorized by:   Mardella Layman MD [facility]   Signed by:   Christie Nottingham CMA (AAMA) on 04/25/2010   Method used:   [facility]  [address] [id number]       [address]       [place], [place]  [address]  Ph: [phone] or [phone]       Fax: [phone]   RxID:   [id number] OMEPRAZOLE 40 MG CPDR (OMEPRAZOLE) one capsule by mouth two times a day  #60 x 11   Entered by:   Christie Nottingham CMA (AAMA)   Authorized by:   Mardella Layman MD [facility]   Signed by:   Christie Nottingham CMA (AAMA) on 04/25/2010   Method used:   [facility]  [address] [id number]       [address]       [place], [place]  [address]       Ph: [phone] or [phone]       Fax: [phone]   RxID:   [id number]

## 2010-07-16 NOTE — Progress Notes (Signed)
       21  22  23  24  25  Allergies Added: CODEINE PHOSPHATE (CODEINE PHOSPHATE) Phone Note Call from Patient Call back at Home Phone [phone]   Caller: [phone] Call For: dr Jonny Ruiz Summary of Call: pt c/o right arm hurt when she rises up and pick up anything Patient's chart has been requested.  Initial call taken by: Shelbie Proctor,  June 07, 2007 8:34 AM  Follow-up for Phone Call        I suspect some kind of muscle, tendon or bursitis problem - consider OV but for now can take tylenol or alleve prn Follow-up by: Corwin Levins MD,  June 07, 2007 9:41 AM  Additional Follow-up for Phone Call Additional follow up Details #1::        Phone Call Completed, Provider Notified, Appt Scheduled TUE  AT 2:45 spoke with pt made aware inform her of dr Jonny Ruiz response June 07, 2007 10:10 AM  Additional Follow-up by: Shelbie Proctor,  June 07, 2007 10:10 AM   New Allergies: CODEINE PHOSPHATE (CODEINE PHOSPHATE) New Allergies: CODEINE PHOSPHATE (CODEINE PHOSPHATE)

## 2010-07-16 NOTE — Progress Notes (Signed)
Summary: CT RESULTS  Medications Added GLYCOPYRROLATE 2 MG TABS (GLYCOPYRROLATE)  HYOSCYAMINE SULFATE 0.125 MG SUBL (HYOSCYAMINE SULFATE) Place 1 tablet under tongue every four to six hours       21  22  23  24  25   Phone Note Call from Patient Call back at Home Phone [phone]   Caller: Patient Call For: Dorn Hartshorne Reason for Call: Lab or Test Results Summary of Call: WOULD LIKE CT SCAN RESULT 2WEEKS AGO/Jami Ohlin/PATIENT'S CHART HAS BEEN REQUESTED Initial call taken by: Guadlupe [facility],  October 11, 2007 12:21 PM  Follow-up for Phone Call        Pt advised of ct results, and she states that still having same pain just happening more frequently now. Pt is taking Levsin every 4-6 hours with our any help. What else can she do?  Follow-up by: Harlow Mares CMA,  October 11, 2007 1:39 PM  Additional Follow-up for Phone Call Additional follow up Details #1::        Schedule  endo/colon   Additional Follow-up by: Mardella Layman MD,  October 11, 2007 4:36 PM    Additional Follow-up for Phone Call Additional follow up Details #2::    advised pt needs to sch endo/colon, pt will cb with day she can have procedure done next week. pt will need previsit and endo/colon.  Called pt number says cant leave a message to cb at a later time   Harlow Mares CMA  October 13, 2007 10:23 AM Called pt number says cant leave a message to cb at a later time  Harlow Mares CMA  October 14, 2007 2:59 PM Called pt number says cant leave a message to cb at a later time  Harlow Mares [facility]  October 14, 2007 4:36 PM  Multiple attempts to contact pt also called the pts work number and they said she just left for the day. If pt calls back we will schedule a endo/colon.Marland KitchenMarland KitchenMarland KitchenHarlow Mares CMA  October 14, 2007 4:37 PM  Follow-up by: Harlow Mares CMA,  October 11, 2007 4:51 PM  New/Updated Medications: GLYCOPYRROLATE 2 MG TABS (GLYCOPYRROLATE)  HYOSCYAMINE SULFATE 0.125 MG SUBL (HYOSCYAMINE SULFATE) Place 1  tablet under tongue every four to six hours

## 2010-07-16 NOTE — Progress Notes (Signed)
Summary: Question about meds  Phone Note Call from Patient Call back at Home Phone [phone]   Call For: Dr Jarold Motto Reason for Call: Refill Medication Summary of Call: Wants to ask nurse question about her meds Initial call taken by: Leanor Kail [facility],  May 06, 2010 8:40 AM  Follow-up for Phone Call        pt rx changed to generic Protonix. She will call back if she needs anything eles.  Follow-up by: Harlow Mares CMA [facility]),  May 06, 2010 1:47 PM    New/Updated Medications: PANTOPRAZOLE SODIUM 40 MG TBEC (PANTOPRAZOLE SODIUM) take one by mouth once daily Prescriptions: PANTOPRAZOLE SODIUM 40 MG TBEC (PANTOPRAZOLE SODIUM) take one by mouth once daily  #30 x 1   Entered by:   Harlow Mares CMA (AAMA)   Authorized by:   Mardella Layman MD [facility]   Signed by:   Harlow Mares CMA (AAMA) on 05/06/2010   Method used:   [facility]  [address] [id number]       [address]       [place], [place]  [address]       Ph: [phone] or [phone]       Fax: [phone]   RxID:   [id number]

## 2010-07-16 NOTE — Progress Notes (Signed)
Summary: Triage  Phone Note Call from Patient Call back at Home Phone [phone]   Caller: Patient Call For: Dr. Jarold Motto Reason for Call: Talk to Nurse Summary of Call: pt. doesn't feel like her Bentyl med. is as affective Initial call taken by: Karna Christmas,  January 31, 2010 9:01 AM  Follow-up for Phone Call        Pt has been taking Bentyl three times a day for a while now.  Pt asks if there is anything else she can try that would work better for her IBS.  Pt cont's to have cramping and diarrhea.  Levsin is listed on med list but pt states she has not tried this medication. Follow-up by: Ashok Cordia RN,  January 31, 2010 11:34 AM  Additional Follow-up for Phone Call Additional follow up Details #1::        as needed  Levsin is ok Additional Follow-up by: Mardella Layman MD Clementeen Graham,  January 31, 2010 11:38 AM    Additional Follow-up for Phone Call Additional follow up Details #2::    Pt notified.  rx sent. Follow-up by: Ashok Cordia RN,  January 31, 2010 12:19 PM  New/Updated Medications: HYOSCYAMINE SULFATE 0.125 MG SUBL (HYOSCYAMINE SULFATE) 1 SL q 4-6 hrs as needed Prescriptions: HYOSCYAMINE SULFATE 0.125 MG SUBL (HYOSCYAMINE SULFATE) 1 SL q 4-6 hrs as needed  #90 x 6   Entered by:   Ashok Cordia RN   Authorized by:   Mardella Layman MD [facility]   Signed by:   Ashok Cordia RN on 01/31/2010   Method used:   [facility]  [address] [id number]       [address]       [place], [place]  [address]       Ph: [phone] or [phone]       Fax: [phone]   RxID:   [id number]

## 2010-07-16 NOTE — Procedures (Signed)
Summary: Gastroenterology EGD  Gastroenterology EGD   Imported By: Irma Newness 11/05/2007 14:01:05  _____________________________________________________________________  External Attachment:    Type:   Image     Comment:   External Document

## 2010-07-16 NOTE — Miscellaneous (Signed)
Summary: GI Previsit  Clinical Lists Changes  Medications: Added new medication of MOVIPREP 100 GM  SOLR (PEG-KCL-NACL-NASULF-NA ASC-C) As per prep instructions. - Signed Rx of MOVIPREP 100 GM  SOLR (PEG-KCL-NACL-NASULF-NA ASC-C) As per prep instructions.;  #1 x 0;  Signed;  Entered by: Barton Fanny RN;  Authorized by: Mardella Layman MD FACG,FAGA;  Method used: Electronic Allergies: Changed allergy or adverse reaction from CODEINE PHOSPHATE (CODEINE PHOSPHATE) to CODEINE PHOSPHATE (CODEINE PHOSPHATE)    Prescriptions: MOVIPREP 100 GM  SOLR (PEG-KCL-NACL-NASULF-NA ASC-C) As per prep instructions.  #1 x 0   Entered by:   Barton Fanny RN   Authorized by:   Mardella Layman MD [facility]   Signed by:   Barton Fanny RN on 12/01/2007   Method used:   Elect[facility] to ...       [facility]  [address] [phone][facility]       [address]       [place], [place]  [address]       Ph: [phone] or [phone]       Fax: [phone]   RxID:   [id number]

## 2010-07-16 NOTE — Progress Notes (Signed)
Summary: samples  Phone Note Call from Patient Call back at Home Phone [phone]   Caller: Patient Call For: Amora Sheehy Reason for Call: Refill Medication, Talk to Nurse Summary of Call: Patient would like some samples of kapidex Initial call taken by: Tawni Levy,  October 12, 2008 10:30 AM  Follow-up for Phone Call        samples up front.  Follow-up by: Harlow Mares CMA,  October 12, 2008 10:34 AM    New/Updated Medications: KAPIDEX 60 MG  CPDR (DEXLANSOPRAZOLE) Take one capsule by mouth 30 minutes before breakfast

## 2010-07-16 NOTE — Progress Notes (Signed)
Summary: Dexilant too expensive  Phone Note Call from Patient Call back at Home Phone [phone]   Call For: Dr Jarold Motto Summary of Call: Can not afford Dexilant and would like something diffrent. Initial call taken by: Leanor Kail [facility],  April 05, 2010 11:06 AM  Follow-up for Phone Call        pt not seen since 2009, i have advised her if she needs medications she will need to be seen in the office, ov 11/10 and she can buy prilosec otc until she is seen. Follow-up by: Harlow Mares CMA [facility]ull),  April 05, 2010 11:40 AM

## 2010-07-16 NOTE — Progress Notes (Signed)
Summary: Medication  Phone Note Call from Patient Call back at Home Phone [phone]   Caller: Patient Call For: Dr. Jarold Motto Reason for Call: Talk to Nurse Summary of Call: pt. has some questions about her med. list Initial call taken by: Karna Christmas,  May 01, 2010 10:44 AM  Follow-up for Phone Call        pt looked over her meds once she got home and relized there were some corrections that needed to be made, I have made them. Follow-up by: Harlow Mares CMA (AAMA),  May 01, 2010 11:01 AM    New/Updated Medications: BENICAR 40 MG TABS (OLMESARTAN MEDOXOMIL) take one by mouth once daily VERAPAMIL HCL CR 240 MG  CP24 (VERAPAMIL HCL) take 1.5 by mouth once daily TRIAMTERENE-HCTZ 37.5-25 MG TABS (TRIAMTERENE-HCTZ) take 1.5 by mouth once daily DICYCLOMINE HCL 10 MG CAPS (DICYCLOMINE HCL) one capsule by mouth three times a day before meals, as needed

## 2010-07-18 NOTE — Progress Notes (Signed)
Summary: Medication  Phone Note Call from Patient Call back at Home Phone [phone]   Caller: Patient Call For: Dr. Jarold Motto Reason for Call: Talk to Nurse Summary of Call: Insurance  is not going to cover her dexilant and she wants to discuss other options Initial call taken by: [place] Johnson,  July 02, 2010 9:55 AM  Follow-up for Phone Call        Left a message on patients machine to call back. patient has been on Zegerd, Protonix, omeprazole in the past.  Follow-up by: Harlow Mares CMA [facility]),  July 02, 2010 10:51 AM  Additional Follow-up for Phone Call Additional follow up Details #1::        spoke to pt changed her to Protonix she does not rememebr why she stopped it before. She will try it again and call back as to  if it works or not.  Additional Follow-up by: Harlow Mares CMA (AAMA),  July 02, 2010 5:07 PM    New/Updated Medications: PANTOPRAZOLE SODIUM 40 MG TBEC (PANTOPRAZOLE SODIUM) take one by mouth once daily Prescriptions: PANTOPRAZOLE SODIUM 40 MG TBEC (PANTOPRAZOLE SODIUM) take one by mouth once daily  #30 x 0   Entered by:   Harlow Mares CMA (AAMA)   Authorized by:   Mardella Layman MD [facility]   Signed by:   Harlow Mares CMA (AAMA) on 07/02/2010   Method used:   [facility]  [address] [id number]       [address]       [place], [place]  [address]       Ph: [phone] or [phone]       Fax: [phone]   RxID:   [id number]

## 2010-07-18 NOTE — Progress Notes (Signed)
Summary: [place]  Phone Note Call from Patient Call back at Home Phone [phone]   Caller: Patient Call For: Dr. Jarold Motto Reason for Call: Talk to Nurse Summary of Call: Pts acid reflux is bothering her and she wants to speak with nurse Initial call taken by: [place] Johnson,  June 26, 2010 8:49 AM  Follow-up for Phone Call        Patient reports her Acid Reflux is acting up. She reports she wakes up and it feels as though something's in her throat, she has a "sour taste and sour stomach", and she gags. Patient is on Pantoprazole daily; she was on Dexilant w/o problems until she went on Medicare and now can't afford it. Also, patient wants to know if exercise can exacerbate her reflux? Graciella Freer RN  June 26, 2010 10:15 AM   Additional Follow-up for Phone Call Additional follow up Details #1::        NEEDS OV Additional Follow-up by: Mardella Layman MD [facility] Graham,  June 26, 2010 10:50 AM    Additional Follow-up for Phone Call Additional follow up Details #2::    Notified patient Dr Jarold Motto would like to see her tomorrow @ 1115am. Patient stated understanding. Follow-up by: Graciella Freer RN,  June 26, 2010 11:14 AM

## 2010-07-18 NOTE — Progress Notes (Signed)
Summary: Medication  Phone Note Call from Patient Call back at Home Phone [phone]   Caller: Patient Call For: Dr. Jarold Motto Reason for Call: Talk to Nurse Summary of Call: Pt has questions about medication Initial call taken by: [place] Johnson,  July 09, 2010 1:50 PM  Follow-up for Phone Call        rx sent per pt she states that the protonix does not help and she would like to try something eles for her reflux. I have sent Nexium and she will call back if it does not work. Follow-up by: Harlow Mares CMA [facility]),  July 09, 2010 2:17 PM    New/Updated Medications: NEXIUM 40 MG CPDR (ESOMEPRAZOLE MAGNESIUM) take one by mouth once daily Prescriptions: NEXIUM 40 MG CPDR (ESOMEPRAZOLE MAGNESIUM) take one by mouth once daily  #30 x 6   Entered by:   Harlow Mares CMA (AAMA)   Authorized by:   Mardella Layman MD [facility]   Signed by:   Harlow Mares CMA (AAMA) on 07/09/2010   Method used:   [facility]        [facility]  [address] [id number]       [address]       [place], [place]  [address]       Ph: [phone] or [phone]       Fax: [phone]   RxID:   [id number]

## 2010-07-18 NOTE — Assessment & Plan Note (Signed)
Summary: rev/cb, worsening reflux   History of Present Illness Visit Type: Follow-up Visit Primary GI MD: Sheryn Bison MD FACP FAGA Primary Provider: Oliver Barre, MD Requesting Provider: na Chief Complaint: Patient here to discuss GERD. She states that she has been having increased nausea and chest discomfort in the mornings since changing from Dexilant to pantoprazole.  History of Present Illness:   Samarah denies GI complaints except for breakthrough reflux symptoms it might with early morning nausea. She currently is on Protonix 40 mg a day and dicyclomine 10 mg twice a day for IBS. She has adult onset diabetes is well controlled. She denies dysphagia, anorexia, weight loss, melena or hematochezia. She also denies abuse of alcohol, cigarettes, or NSAIDs. She is up-to-date on her endoscopy and colonoscopy exams.   GI Review of Systems    Reports abdominal pain, acid reflux, chest pain, heartburn, and  nausea.     Location of  Abdominal pain: lower abdomen.    Denies belching, bloating, dysphagia with liquids, dysphagia with solids, loss of appetite, vomiting, vomiting blood, weight loss, and  weight gain.      Reports diverticulosis and  irritable bowel syndrome.     Denies anal fissure, black tarry stools, change in bowel habit, constipation, diarrhea, fecal incontinence, heme positive stool, hemorrhoids, jaundice, light color stool, liver problems, rectal bleeding, and  rectal pain. Preventive Screening-Counseling & Management  Caffeine-Diet-Exercise     Does Patient Exercise: no      Drug Use:  no.      Current Medications (verified): 1)  Benicar 40 Mg Tabs (Olmesartan Medoxomil) .... Take One By Mouth Once Daily 2)  Verapamil Hcl Cr 240 Mg  Cp24 (Verapamil Hcl) .... Take 1.5 By Mouth Once Daily 3)  Ecotrin Low Strength 81 Mg  Tbec (Aspirin) .Marland Kitchen.. 1 By Mouth Qd 4)  Actoplus Met 15-850 Mg  Tabs (Pioglitazone Hcl-Metformin Hcl) .Marland Kitchen.. 1 Tablet By Mouth Once Daily 5)  Multivitamins    Tabs (Multiple Vitamin) .Marland Kitchen.. 1 Tablet By Mouth Once Daily 6)  Pantoprazole Sodium 40 Mg Tbec (Pantoprazole Sodium) .... Take One By Mouth Once Daily 7)  Vitamin D3 1000 Unit Caps (Cholecalciferol) .... Take 1 Capsule By Mouth Once Daily 8)  Magnesium 500 Mg Tabs (Magnesium) .... One Tablet By Mouth Once Daily 9)  Pravastatin Sodium 20 Mg Tabs (Pravastatin Sodium) .... One Tablet By Mouth Once Daily 10)  Triamterene-Hctz 37.5-25 Mg Tabs (Triamterene-Hctz) .... Take 1/2 Tablet By Mouth Once Daily 11)  Dicyclomine Hcl 10 Mg Caps (Dicyclomine Hcl) .... One Capsule By Mouth Three Times A Day Before Meals, As Needed  Allergies (verified): 1)  Codeine Phosphate (Codeine Phosphate)  Past History:  Past medical, surgical, family and social histories (including risk factors) reviewed for relevance to current acute and chronic problems.  Past Medical History: Reviewed history from 04/25/2010 and no changes required. Anxiety esophageal stricture DIVERTICULOSIS, COLON (ICD-562.10) DEGENERATIVE JOINT DISEASE (ICD-715.90) HYPERTENSIVE CARDIOVASCULAR DISEASE (ICD-402.90) GASTRITIS (ICD-535.50) HIATAL HERNIA (ICD-553.3) SHOULDER PAIN, RIGHT (ICD-719.41) OTHER TENOSYNOVITIS OF HAND AND WRIST (ICD-727.05) ESOPHAGEAL STRICTURE (ICD-530.3) IBS (ICD-564.1) ANXIETY (ICD-300.00) HYPERLIPIDEMIA (ICD-272.4) DIABETES MELLITUS, TYPE II (ICD-250.00) HYPERTENSION (ICD-401.9) GERD (ICD-530.81)   weakly pos RF  Past Surgical History: Reviewed history from 12/31/2007 and no changes required. Cholecystectomy,Laproscopic per Dr. Jamey Ripa in 1993. partial Hysterectomy  Family History: Reviewed history from 12/31/2007 and no changes required. No FH of Colon Cancer: Family History of Breast Cancer:maternal Aunt Family History of Diabetes: Mother and brother  Social History: Reviewed history from 04/25/2010 and  no changes required. Retired Never Smoked Alcohol use-no Illicit Drug Use - no Patient does  not get regular exercise.  Drug Use:  no Does Patient Exercise:  no  Review of Systems       The patient complains of urination - excessive.  The patient denies allergy/sinus, anemia, anxiety-new, arthritis/joint pain, back pain, blood in urine, breast changes/lumps, change in vision, confusion, cough, coughing up blood, depression-new, fainting, fatigue, fever, headaches-new, hearing problems, heart murmur, heart rhythm changes, itching, menstrual pain, muscle pains/cramps, night sweats, nosebleeds, pregnancy symptoms, shortness of breath, skin rash, sleeping problems, sore throat, swelling of feet/legs, swollen lymph glands, thirst - excessive , urination - excessive , urine leakage, vision changes, and voice change.    Vital Signs:  Patient profile:   65 year old female Height:      66 inches Weight:      194 pounds BMI:     31.43 BSA:     1.98 Pulse rate:   100 / minute Pulse rhythm:   regular BP sitting:   112 / 68  (left arm)  Vitals Entered By: Lamona Curl CMA Duncan Dull) (June 27, 2010 11:23 AM)  Physical Exam  General:  Well developed, well nourished, no acute distress.healthy appearing.   Head:  Normocephalic and atraumatic. Eyes:  PERRLA, no icterus.exam deferred to patient's ophthalmologist.   Lungs:  Clear throughout to auscultation. Heart:  Regular rate and rhythm; no murmurs, rubs,  or bruits. Abdomen:  Soft, nontender and nondistended. No masses, hepatosplenomegaly or hernias noted. Normal bowel sounds. Neurologic:  Alert and  oriented x4;  grossly normal neurologically. Cervical Nodes:  No significant cervical adenopathy. Psych:  Alert and cooperative. Normal mood and affect.   Impression & Recommendations:  Problem # 1:  GERD (ICD-530.81) Assessment Deteriorated Change to Dexilant 60 mg a day with Pepcid AC at bedtime and standard anti-reflux maneuvers.  Problem # 2:  HYPERTENSIVE CARDIOVASCULAR DISEASE (ICD-402.90) Assessment: Improved blood pressure  today normal at 112/68, Norvasc continue other medications per primary care. She is on daily aspirin tablet.  Problem # 3:  IBS (ICD-564.1) Assessment: Improved Continue high-fiber diet as tolerated with p.r.n. dicyclomine use.  Patient Instructions: 1)  Copy sent to : Oliver Barre, MD 2)  Your prescription(s) have been sent to you pharmacy.  3)  Take Pepcid AC one by mouth once daily, buy OTC. 4)  Take the samples of Dexilant and an rx has been sent to your pharmacy. 5)  The medication list was reviewed and reconciled.  All changed / newly prescribed medications were explained.  A complete medication list was provided to the patient / caregiver. 6)  Avoid foods high in acid content ( tomatoes, citrus juices, spicy foods) . Avoid eating within 3 to 4 hours of lying down or before exercising. Do not over eat; try smaller more frequent meals. Elevate head of bed four inches when sleeping.  7)  High Fiber, Low Fat  Healthy Eating Plan brochure given.  Prescriptions: DEXILANT 60 MG CPDR (DEXLANSOPRAZOLE) take one by mouth once daily  #30 x 6   Entered by:   Harlow Mares CMA (AAMA)   Authorized by:   Mardella Layman MD [facility]   Signed by:   Mardella Layman MD [facility] on 06/27/2010   Method used:   [facility]  [address] [id number]       [address]       [facility]  [place], [place]  [address]       Ph: [phone] or [phone]       Fax: [phone]   RxID:   [id number]

## 2010-07-18 NOTE — Letter (Signed)
Summary: Generic Letter  [facility]  [address] [place], [place] [address]   Phone: [phone]  Fax: [phone]    06/27/2010 MRN: [id number]  [address] [place], [place]  [address]  To Who it may Concern:   Due to Daisy Pettifords current medical conditions she is unable to exercise, please take this into consideration in the termination of her gym Membership.If there are questions please call [phone].      Sincerely,   Harlow Mares CMA (AAMA) [facility]

## 2010-07-24 NOTE — Progress Notes (Signed)
Summary: QUESTION A BOUT A LETTER  Phone Note Call from Patient Call back at Home Phone [phone]   Call For: Dr Jarold Motto Summary of Call: Question about a letter that was  typed - wonders if you by chance a confirmation of where you faxed it to? Initial call taken by: Leanor Kail [facility],  July 15, 2010 10:20 AM  Follow-up for Phone Call        she states that now she needs disability papers filled out for her to get out of her [employer]. I have advised her that we provided her with a letter and faxed it to the location she gave [place] in office. I advised her if she needs disability papers filled out she will have to bring them to the basement and there is a fee for completeing them. Follow-up by: Harlow Mares CMA Duncan Dull),  July 15, 2010 10:37 AM

## 2010-08-06 ENCOUNTER — Telehealth: Payer: Self-pay | Admitting: Gastroenterology

## 2010-08-07 ENCOUNTER — Telehealth (INDEPENDENT_AMBULATORY_CARE_PROVIDER_SITE_OTHER): Payer: Self-pay | Admitting: *Deleted

## 2010-08-07 ENCOUNTER — Encounter: Payer: Self-pay | Admitting: Gastroenterology

## 2010-08-13 NOTE — Progress Notes (Signed)
Summary: refill  Phone Note Call from Patient Call back at Home Phone [phone]   Caller: Patient Call For: Dr Jarold Motto Reason for Call: Talk to Nurse Summary of Call: Patient states that she needs refills for her Dicyclomine but her insurance wants her to try this med instead (loperamide) Initial call taken by: Tawni Levy,  August 06, 2010 4:28 PM  Follow-up for Phone Call        advised pt that the loperamide is not for abdominal cramping it is for diarrhea. Advised her that her insurance will not cover any of the drugs used for abdominal spasms. She will have to purchase it if she feels she needs it.  Follow-up by: Harlow Mares CMA Duncan Dull),  August 06, 2010 4:36 PM

## 2010-08-13 NOTE — Progress Notes (Signed)
Summary: questions  Phone Note Call from Patient Call back at Home Phone [phone]   Caller: Patient Call For: Dr. Jarold Motto Reason for Call: Talk to Nurse Summary of Call: Pt has questions for a nurse Initial call taken by: [place] Johnson,  August 07, 2010 9:14 AM  Follow-up for Phone Call        rx sent again and I will call to do another prior auth when we get the form. pt aware Follow-up by: Harlow Mares CMA (AAMA),  August 07, 2010 10:13 AM    Prescriptions: DICYCLOMINE HCL 10 MG CAPS (DICYCLOMINE HCL) one capsule by mouth three times a day before meals, as needed  #90 x 3   Entered by:   Harlow Mares CMA (AAMA)   Authorized by:   Mardella Layman MD [facility]   Signed by:   Harlow Mares CMA (AAMA) on 08/07/2010   Method used:   [facility]  [address] [id number]       [address]       [place], [place]  [address]       Ph: [phone] or [phone]       Fax: [phone]   RxID:   [id number]   Appended Document: questions recieved fax from [employer] they will not cover bentyl and she will have to purchase it if she wishes to take it. I have advised pt and she states that the rx is only 20. so I have advised her to purchase.

## 2010-08-13 NOTE — Medication Information (Signed)
Summary: Prior autho & denied for Dicyclomine/[employer]  Prior autho & denied for Dicyclomine/[employer]   Imported By: Sherian Rein 08/09/2010 12:43:49  _____________________________________________________________________  External Attachment:    Type:   Image     Comment:   External Document

## 2010-10-29 NOTE — Assessment & Plan Note (Signed)
[facility]                         GASTROENTEROLOGY OFFICE NOTE   Daisy Dillon, Daisy Dillon                        MRN:          [id number]  DATE:09/23/2007                            DOB:          [birth year]    Daisy Dillon is a 65 year old African-American female with  hypertensive cardiovascular disease and chronic irritable bowel  syndrome.  She is status post laparoscopic cholecystectomy because of  cholesterolosis of her gallbladder.  She has been in followup for many  years in our office, and carries a diagnosis of irritable bowel syndrome  and acid reflux disease, and has been doing fairly well until the last  few weeks when she has had recurrent mid-abdominal pain which occurs  every 2 to 3 weeks, usually about the same time of the month without  precipitating or alleviating ailments.  The pain is definitely crampy  but rather severe, and is not associated with nausea or vomiting or  change in bowel habits.  She denies clay-colored stools, dark urine,  icterus, fever or chills, melena or hematochezia.  She has been  evaluated by Dr. Eda Paschal, and apparently had a normal pelvic exam and  pelvic ultrasound exam.  I really cannot get too many upper  gastrointestinal symptoms at this time, and she is taking Kapidex 60 mg  a day.   She is diabetic since the early 2000s and is on Actos Plus daily,  Benicar/hydrochlorothiazide 40/12.5 mg a day, verapamil 240 mg a day,  600 mg of calcium twice a day, aspirin 81 mg a day, estradiol daily,  potassium replacement, glucosamine twice a day, p.r.n. NuLev.  She  previously was on Reglan which has been discontinued.   She has a history of CODEINE allergy.   The patient denies any major change in her bowel pattern or her dietary  habits.  She does complain of a fair amount of gas and bloating.  I have  asked her to avoid sorbitol and fructose.  She, in the past, has had  abnormal liver function tests which  have been felt secondary to statin  medications.  She has had a positive ANA for some time without any  evidence of vasculitis or lupus.  Her diabetes is managed by Dr. Reather Littler.  In reviewing her chart, I cannot see where there has been any  evidence of anemia or other lab abnormalities, but she has not had blood  in a good period of time.   Daisy Dillon denies abuse of cigarettes, alcohol, or NSAIDs.  Her bowels are  moving fairly well without melena or hematochezia.  She gives no history  of hepatitis or pancreatitis.  There has been no fever, chills, or  systemic complaints.   She is a healthy-appearing black female in no distress, appearing her  stated age.  She weighs 189, and blood pressure 110/72, and pulse was 76 and regular.  I could not appreciate stigmata of chronic liver disease or thyromegaly.  Her chest was clear and she is in a regular rhythm without murmurs,  gallops, or rubs.  Her abdomen showed no distension, organomegaly, masses, or tenderness.  Bowel sounds were normal and not obstructive in nature.  Peripheral extremities were unremarkable.  Inspection of the rectum was unremarkable, as was rectal exam, and there  was soft stool present, and it was guaiac negative.   ASSESSMENT:  1. Irritable bowel syndrome with new abdominal pain in the mid abdomen      certainly suggests a possible intermittent partial small bowel      obstruction, etiology unclear.  2. History of chronic irritable bowel syndrome.  3. History of chronic gastroesophageal reflux disease.  4. Adult-onset diabetes.  5. History of hypertensive cardiovascular disease with hyperlipidemia.  6. History of degenerative arthritis without non-steroidal anti-      inflammatory drug abuse.  Some of her symptoms certainly sound like      possible non-steroidal anti-inflammatory drug injury to the gut,      however.   RECOMMENDATIONS:  1. Repeat screening laboratory parameters.  2. Abdominal-pelvic CT scan.   3. Consider repeating endoscopy and colonoscopy and gastric emptying      scan.  4. Low-fiber diet with p.r.n. Levsin until workup completed.  5. I have asked her to avoid sorbitol and fructose as she does have a      history of lactose intolerance.     Daisy Dillon. Jarold Motto, MD, Caleen Essex, FAGA  Electronically Signed    DRP/MedQ  DD: 09/23/2007  DT: 09/23/2007  Job #: [id number]   cc:   Rosalyn Gess. Norins, MD

## 2010-11-01 NOTE — Assessment & Plan Note (Signed)
[facility]                         GASTROENTEROLOGY OFFICE NOTE   Daisy Dillon                        MRN:          [id number]  DATE:08/05/2006                            DOB:          21-Jul-1945    Daisy Dillon returns today and is about the same as per my clinic note of July 16, 2006. She had no response to a trial of Ultrase tablets. She  continues complaining of gas, bloating, and has what sounds like lactose  intolerance. I have given her a lactose-free diet and asked her to use  p.r.n. lactate tablets.   Her liver function tests remain mildly elevated despite being off of  Pravachol for 4 to 6 weeks. On reviewing her chart, she has had mild  hypertransaminasemia for several years and we will do usual hepatic  workup for hypertransaminasemia. She denies any history of ethanol abuse  or family history of liver disease. She is status post cholecystectomy  and ultrasound exam on July 08, 2006 was generally unremarkable  except for known left renal cyst.   I will see her back in approximately 6 weeks time, we will review her  lab data and see how she is doing clinically. She is to continue her  other medications that are listed and reviewed in her chart at this time  but to discontinue her Ultrase tablets.     Daisy Rea. Jarold Motto, MD, Caleen Essex, FAGA  Electronically Signed    DRP/MedQ  DD: 08/05/2006  DT: 08/05/2006  Job #: [id number]

## 2010-11-01 NOTE — Assessment & Plan Note (Signed)
[facility]                         GASTROENTEROLOGY OFFICE NOTE   NAME:Kroeze, Victorina                        MRN:          [id number]  DATE:09/08/2006                            DOB:          04-16-1946    Skylynne really is fairly asymptomatic at this time except for some gas and  some bloating, which is obviously tied to her lactose intolerance.  She  refuses to follow a lactose-free diet.  Her acid reflux is well  controlled on daily Zegerid 40 mg.   Because of her abnormal liver function test, she has had extensive  evaluation, which has shown positive ANA of 1:640.  She also had a  weakly positive rheumatoid factor of 37.2, normal is zero to 20, but she  has no rheumatoid arthritis type symptomatology.  Her liver function  tests remain less than twice normal.  These did not normalize off statin  medication.  She currently is on a variety of antihypertensive, listed  and reviewed in her chart, also metformin 1000 mg a day.   Patient denies chronic fatigue, right upper quadrant pain, or any  symptoms of chronic liver disease.  She denies abuse of alcohol.  Vital  signs are all stable.   RECOMMENDATIONS:  1. Check liver enzymes every 3 months.  As long as these are less than      twice normal, I see no need for liver biopsy.  2. Continue reflux regime and daily Zegerid therapy.  3. Lactose-free diet with p.r.n. Lactaid use.  4. Primary care followup with Dr. Debby Bud.     Vania Rea. Jarold Motto, MD, Caleen Essex, FAGA  Electronically Signed    DRP/MedQ  DD: 09/08/2006  DT: 09/08/2006  Job #: [id number]   cc:   Rosalyn Gess. Norins, MD

## 2010-11-01 NOTE — Assessment & Plan Note (Signed)
[facility]                         GASTROENTEROLOGY OFFICE NOTE   Daisy Dillon, Daisy Dillon                        MRN:          [id number]  DATE:07/16/2006                            DOB:          Jan 24, 1946    Daisy Dillon is doing okay but continues to complain of a lot of abdominal gas  and bloating. She currently is on Zegerid 40 mg a day, along with other  multiple medications listed and reviewed in the chart. Her workup today  has been fairly unremarkable including an ultrasound. She is status post  cholecystectomy. She did, however, have an elevated serum trypsinogen  and slightly elevated blood glucose and is on metformin. Because of  abnormal liver function tests in January, I asked her just to hold her  Pravachol, and we will repeat lab tests today since it was not done when  her blood was drawn on January 23. At that time, sprue antibodies were  negative, but her trypsinogen level was 75. Her alpha-fetoprotein level  was normal. Serum carotene was normal at 134.   She is awake and alert in no acute distress. She really has minor  complaints today. She does weigh 190 pounds and blood pressure 130/70.  Pulse was 74 and regular.   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 Previous treatment for bacterial overgrowth  syndrome has not caused any elevation of her symptoms.   RECOMMENDATIONS:  I placed her on a trial of Ultrase MT 20 tablets 3  times a day with meals, see how she does symptomatically while I  continue her other medications. I will see her back in several weeks'  time for followup to see how she is doing symptomatically.     Daisy Rea. Jarold Motto, MD, Caleen Essex, FAGA  Electronically Signed    DRP/MedQ  DD: 07/16/2006  DT: 07/16/2006  Job #: [id number]

## 2010-11-01 NOTE — Assessment & Plan Note (Signed)
[facility]                         GASTROENTEROLOGY OFFICE NOTE   Jalisha, Enneking Sundus                        MRN:          [id number]  DATE:05/21/2006                            DOB:          [birth year]    Daisy Dillon continued to have diarrhea with abdominal gas, bloating, and  cramping.  She has chronic irritable bowel syndrome and known lactose  intolerance.  She has a long involved GI history with multiple previous  negative workups.  She is status post cholecystectomy.  She has had no  concerning symptoms such as anorexia, weight loss, melena, hematochezia,  nausea and vomiting, etc.  She is on chronic PPI therapy and does have  underlying diabetes.   PHYSICAL EXAMINATION:  Her weight is 187 pounds, which is about 20  pounds down from her normal weight.  She has been voluntarily losing  weight.  Her blood pressure 130/70 and pulse was 78 and regular.  ABDOMEN:  Showed no hepatosplenomegaly, masses or tenderness.  Bowel  sounds were nonobstructed.   ASSESSMENT:  I think there is a good likelihood that Kamaya has bacterial  overgrowth syndrome associated with her chronic proton pump inhibitor  therapy, diabetes, etc.  She otherwise has had previous negative  gastrointestinal workups and has had no evidence of pancreatic  insufficiency or other causes of malabsorption.   RECOMMENDATIONS:  I have given her a trial of Xifaxan 400 mg t.i.d. for  10 days along with Florstar Probiotic therapy.  We will see how she does  symptomatically before proceeding further while continuing her other  medications.     Vania Rea. Jarold Motto, MD, Caleen Essex, FAGA  Electronically Signed    DRP/MedQ  DD: 05/21/2006  DT: 05/21/2006  Job #: [id number]   cc:   Corwin Levins, MD

## 2010-12-09 ENCOUNTER — Telehealth: Payer: Self-pay | Admitting: Gastroenterology

## 2010-12-09 NOTE — Telephone Encounter (Signed)
Pt will think about which PPI she would like to be changed to.

## 2010-12-12 ENCOUNTER — Telehealth: Payer: Self-pay | Admitting: Gastroenterology

## 2010-12-12 MED ORDER — DEXLANSOPRAZOLE 60 MG PO CPDR: 60.0000 mg | DELAYED_RELEASE_CAPSULE | Freq: Every day | ORAL | Status: DC

## 2010-12-12 NOTE — Telephone Encounter (Signed)
rx sent pt aware 

## 2010-12-19 ENCOUNTER — Other Ambulatory Visit: Payer: Self-pay | Admitting: Gastroenterology

## 2010-12-26 ENCOUNTER — Other Ambulatory Visit: Payer: Self-pay | Admitting: Obstetrics and Gynecology

## 2010-12-26 DIAGNOSIS — R921 Mammographic calcification found on diagnostic imaging of breast: Secondary | ICD-10-CM

## 2011-01-20 ENCOUNTER — Ambulatory Visit
Admission: RE | Admit: 2011-01-20 | Discharge: 2011-01-20 | Disposition: A | Discharge status: Home / Self Care | Payer: Medicare HMO | Source: Ambulatory Visit | Attending: Obstetrics and Gynecology | Admitting: Obstetrics and Gynecology

## 2011-01-20 DIAGNOSIS — R921 Mammographic calcification found on diagnostic imaging of breast: Secondary | ICD-10-CM

## 2011-03-13 ENCOUNTER — Other Ambulatory Visit: Payer: Self-pay | Admitting: Gastroenterology

## 2011-03-13 NOTE — Telephone Encounter (Signed)
Pt aware she needs to call [facility] and have [facility] call [facility] about changing her rx over.

## 2011-04-02 ENCOUNTER — Encounter: Payer: Self-pay | Admitting: Gynecology

## 2011-04-10 ENCOUNTER — Ambulatory Visit (INDEPENDENT_AMBULATORY_CARE_PROVIDER_SITE_OTHER): Payer: Medicare HMO | Admitting: Obstetrics and Gynecology

## 2011-04-10 ENCOUNTER — Encounter: Payer: Self-pay | Admitting: Obstetrics and Gynecology

## 2011-04-10 VITALS — BP 140/74 | Ht 66.0 in | Wt 187.0 lb

## 2011-04-10 DIAGNOSIS — Z23 Encounter for immunization: Secondary | ICD-10-CM

## 2011-04-10 DIAGNOSIS — N951 Menopausal and female climacteric states: Secondary | ICD-10-CM

## 2011-04-10 DIAGNOSIS — Z780 Asymptomatic menopausal state: Secondary | ICD-10-CM

## 2011-04-10 DIAGNOSIS — R1013 Epigastric pain: Secondary | ICD-10-CM

## 2011-04-10 DIAGNOSIS — N393 Stress incontinence (female) (male): Secondary | ICD-10-CM

## 2011-04-10 NOTE — Progress Notes (Signed)
Subjective:     Patient ID: Daisy Dillon, female   DOB: Oct 23, 1945, 65 y.o.   MRN: [id number]  HPIpatient came to see me today for further followup. She continues to have midepigastric pain which she thinks dates back 4-5 years.during the interval that she's complained of pain she's had 2 normal ultrasounds except for a possible paraovarian cyst seen on the first one but not on the second one. She is seen to GI doctors. She's had a colonoscopy. She's been treated for IBS. No one has made a definitive diagnosis. It is not associated with any change in bowel habits, nausea, or vomiting. Although it is not in the pelvis the patient relates it to menstrual cramps although she does not have a uterus. She has also seen a urologist and could not give her a diagnosis.  she is up-to-date on mammograms and bone densities. She is having no vaginal bleeding or pelvic pain. She continues to have some hot flashes. She feels they're tolerable. She continues to have loss of urine with coughing laughing sneezing. She has no other urinary symptoms.   Review of Systems  Constitutional:       Diabetes mellitus  HENT: Negative.   Eyes: Negative.   Respiratory: Negative.   Cardiovascular:       Hypertensive cardiovascular disease  Gastrointestinal:       Esophageal stricture, GERD, hiatal hernia, and diverticulosis.  Genitourinary: Negative.   Musculoskeletal:       Degenerative joint disease with shoulder pain       Objective:   Physical ExamHEENT: Within normal limits. Kennon Portela present Neck: No masses. Supraclavicular lymph nodes: Not enlarged. Breasts: Examined in both sitting and lying position. Symmetrical without skin changes or masses. Abdomen: Soft no masses guarding or rebound. No hernias. Pelvic: External within normal limits. BUS within normal limits. Vaginal examination shows good estrogen effect, no cystocele enterocele or rectocele. Cervix and uterus absent. Adnexa within normal  limits. Rectovaginal confirmatory. Extremities within normal limits.      Assessment:     Menopausal symptoms. Urinary stress incontinence. Midepigastric pain.    Plan:     Continue yearly mammograms. Continued observation of menopausal symptoms and USI. We rediscussed this midepigastric pain. I told her that I was still skeptical that it was GYN related. We both agreed that since it hadn't changed since her last ultrasound there was no recent repeat the ultrasound. I treated her with ibuprofen 800 mg every 6 hours when necessary. I suggested that she rediscuss it with her GI doctor. We discussed adhesions from her hysterectomy. I offered to send her to the pelvic pain clinic at [facility]. We discussed possible laparoscopy. She will inform. I think we should send her to the pain clinic prior to any surgery.

## 2011-05-18 ENCOUNTER — Other Ambulatory Visit: Payer: Self-pay | Admitting: Gastroenterology

## 2011-05-22 ENCOUNTER — Telehealth: Payer: Self-pay | Admitting: Gastroenterology

## 2011-05-22 MED ORDER — DICYCLOMINE HCL 10 MG PO CAPS: ORAL_CAPSULE | ORAL | Status: DC

## 2011-05-22 NOTE — Telephone Encounter (Signed)
Refill sent.

## 2011-05-22 NOTE — Telephone Encounter (Signed)
Spoke with pt and she states she does not have refills on her dicyclomine rx. Do you know about this refill? Please advise.

## 2011-06-13 ENCOUNTER — Encounter: Payer: Self-pay | Admitting: *Deleted

## 2011-06-19 ENCOUNTER — Encounter: Payer: Self-pay | Admitting: Gastroenterology

## 2011-06-19 ENCOUNTER — Ambulatory Visit (INDEPENDENT_AMBULATORY_CARE_PROVIDER_SITE_OTHER): Payer: MEDICARE | Admitting: Gastroenterology

## 2011-06-19 VITALS — BP 132/68 | HR 76 | Ht 66.0 in | Wt 190.0 lb

## 2011-06-19 DIAGNOSIS — F458 Other somatoform disorders: Secondary | ICD-10-CM

## 2011-06-19 DIAGNOSIS — K222 Esophageal obstruction: Secondary | ICD-10-CM

## 2011-06-19 DIAGNOSIS — R0989 Other specified symptoms and signs involving the circulatory and respiratory systems: Secondary | ICD-10-CM

## 2011-06-19 DIAGNOSIS — K219 Gastro-esophageal reflux disease without esophagitis: Secondary | ICD-10-CM | POA: Insufficient documentation

## 2011-06-19 DIAGNOSIS — R198 Other specified symptoms and signs involving the digestive system and abdomen: Secondary | ICD-10-CM | POA: Insufficient documentation

## 2011-06-19 MED ORDER — DEXLANSOPRAZOLE 60 MG PO CPDR: 60.0000 mg | DELAYED_RELEASE_CAPSULE | Freq: Every day | ORAL | Status: DC

## 2011-06-19 NOTE — Progress Notes (Signed)
This is a very nice 66 year old African American female with chronic acid reflux doing well on Dexilant 60 mg every morning. She also takes dicyclomine 10 mg 3 times a day for IBS diarrhea syndrome. Her adult also diabetes is well controlled with oral medications. She really denies any complaints except for a globus sensation in her throat true dysphagia, chest pain, or other gastrointestinal issues. She is status post cholecystectomy. Her appetite is good her weight is stable and she denies melena, hematochezia, or bowel irregularity otherwise.  Current Medications, Allergies, Past Medical History, Past Surgical History, Family History and Social History were reviewed in [employer] record.  Pertinent Review of Systems Negative   Physical Exam: Healthy appearing patient in no acute distress. Examination the neck is unremarkable without thyromegaly or lymphadenopathy. Chest is clear and she appears to be in a regular rhythm without murmurs gallops or rubs. There is no organomegaly, no abdominal masses or tenderness. Bowel sounds are normal. Mental status is clear.    Assessment and Plan: Chronic acid reflux doing well on daily PPI therapy. I have stressed the importance to her of taking her medication 30 minutes before first meal of the day. Recheck with [place] on when necessary basis. I have reviewed a standard antireflux regime with this patient. She is up-to-date on her endoscopy and colonoscopy exams. Encounter Diagnoses  Name Primary?  . GERD with stricture Yes  . Globus sensation

## 2011-06-19 NOTE — Patient Instructions (Signed)
Your prescription(s) have been sent to you pharmacy.   

## 2011-06-22 ENCOUNTER — Other Ambulatory Visit: Payer: Self-pay | Admitting: Gastroenterology

## 2011-07-08 ENCOUNTER — Telehealth: Payer: Self-pay | Admitting: Gastroenterology

## 2011-07-08 NOTE — Telephone Encounter (Signed)
Form filled out waiting on response.

## 2011-07-10 ENCOUNTER — Encounter: Payer: Self-pay | Admitting: *Deleted

## 2011-07-10 NOTE — Telephone Encounter (Signed)
Pt just had her gi records sent to her primary care at [facility], she is scheduling her pre visit and colonoscopy. I have transferred her to [place] to schedule. Pt will also call her insurance company.

## 2011-07-10 NOTE — Telephone Encounter (Signed)
Last note was in error still working on prior [place].

## 2011-07-14 ENCOUNTER — Telehealth: Payer: Self-pay | Admitting: Gastroenterology

## 2011-07-14 NOTE — Telephone Encounter (Signed)
See next note

## 2011-07-14 NOTE — Telephone Encounter (Signed)
Noted  

## 2011-07-15 ENCOUNTER — Telehealth: Payer: Self-pay | Admitting: Gastroenterology

## 2011-07-15 MED ORDER — LANSOPRAZOLE 30 MG PO CPDR: 30.0000 mg | DELAYED_RELEASE_CAPSULE | Freq: Every day | ORAL | Status: DC

## 2011-07-15 NOTE — Telephone Encounter (Signed)
pts insurance will cover dexilant but it is $80 and she can not afford it and she can not use a coupon since she has medicare. I have sent in generic prevacid since it is covered on her insurance at a generic cost. She will try and call back if it does not work .

## 2011-07-29 ENCOUNTER — Telehealth: Payer: Self-pay | Admitting: Gastroenterology

## 2011-07-30 MED ORDER — DEXLANSOPRAZOLE 60 MG PO CPDR: 60.0000 mg | DELAYED_RELEASE_CAPSULE | Freq: Every day | ORAL | Status: DC

## 2011-07-30 NOTE — Telephone Encounter (Signed)
Samples of Dexilant left for pt at front desk, she has tried Prevacid, Nexium, omeprazole, protonix, zegerid all with no response or help in controlling her reflux. I have sent a new rx of Dexilant and I will do prior auth when the information is faxed from the pharmacy. Pt aware

## 2011-07-31 ENCOUNTER — Telehealth: Payer: Self-pay | Admitting: Gastroenterology

## 2011-07-31 NOTE — Telephone Encounter (Signed)
pts Dexilant is covered but it $164 a month and she cant afford it I have advised her that there is no more on our end we can do, we have sent all PPIs and none work for her except Dexilant and she has to decide if she wants to take it she will have to pay the high copay.

## 2011-07-31 NOTE — Telephone Encounter (Signed)
See previous phone note.  

## 2011-08-11 ENCOUNTER — Telehealth: Payer: Self-pay | Admitting: Gastroenterology

## 2011-08-12 ENCOUNTER — Other Ambulatory Visit: Payer: Self-pay | Admitting: *Deleted

## 2011-08-12 MED ORDER — DEXLANSOPRAZOLE 60 MG PO CPDR: 60.0000 mg | DELAYED_RELEASE_CAPSULE | Freq: Every day | ORAL | Status: DC

## 2011-08-12 NOTE — Telephone Encounter (Signed)
Advised pt she will have to drop the form off and once it is completed i will call her to pick it up.

## 2011-08-12 NOTE — Telephone Encounter (Signed)
Pt assistance form completed and rx printed and pt called to come pick up, form up front to pick up.

## 2011-08-15 ENCOUNTER — Telehealth: Payer: Self-pay | Admitting: *Deleted

## 2011-08-15 NOTE — Telephone Encounter (Signed)
Form and rx printed for pt assistance, she will come on Monday and bring her income information and we can fax the form for her.

## 2011-08-25 ENCOUNTER — Telehealth: Payer: Self-pay | Admitting: *Deleted

## 2011-08-25 NOTE — Telephone Encounter (Signed)
Pt aware she is approved for dexilant pt assistance

## 2011-10-28 ENCOUNTER — Other Ambulatory Visit: Payer: Self-pay | Admitting: Gastroenterology

## 2011-12-29 ENCOUNTER — Other Ambulatory Visit: Payer: Self-pay | Admitting: Obstetrics and Gynecology

## 2011-12-29 DIAGNOSIS — Z1231 Encounter for screening mammogram for malignant neoplasm of breast: Secondary | ICD-10-CM

## 2012-01-23 ENCOUNTER — Ambulatory Visit (HOSPITAL_COMMUNITY)
Admission: RE | Admit: 2012-01-23 | Discharge: 2012-01-23 | Disposition: A | Discharge status: Home / Self Care | Payer: Medicare Other | Source: Ambulatory Visit | Attending: Obstetrics and Gynecology | Admitting: Obstetrics and Gynecology

## 2012-01-23 DIAGNOSIS — Z1231 Encounter for screening mammogram for malignant neoplasm of breast: Secondary | ICD-10-CM | POA: Insufficient documentation

## 2012-03-01 ENCOUNTER — Other Ambulatory Visit: Payer: Self-pay | Admitting: Gastroenterology

## 2012-04-03 ENCOUNTER — Other Ambulatory Visit: Payer: Self-pay | Admitting: Gastroenterology

## 2012-04-12 ENCOUNTER — Encounter: Payer: Self-pay | Admitting: Obstetrics and Gynecology

## 2012-04-12 ENCOUNTER — Ambulatory Visit (INDEPENDENT_AMBULATORY_CARE_PROVIDER_SITE_OTHER): Payer: Medicare Other | Admitting: Obstetrics and Gynecology

## 2012-04-12 VITALS — BP 130/78 | Ht 66.0 in | Wt 192.0 lb

## 2012-04-12 DIAGNOSIS — K449 Diaphragmatic hernia without obstruction or gangrene: Secondary | ICD-10-CM | POA: Insufficient documentation

## 2012-04-12 DIAGNOSIS — K219 Gastro-esophageal reflux disease without esophagitis: Secondary | ICD-10-CM | POA: Insufficient documentation

## 2012-04-12 DIAGNOSIS — N951 Menopausal and female climacteric states: Secondary | ICD-10-CM

## 2012-04-12 DIAGNOSIS — R232 Flushing: Secondary | ICD-10-CM

## 2012-04-12 DIAGNOSIS — N393 Stress incontinence (female) (male): Secondary | ICD-10-CM

## 2012-04-12 DIAGNOSIS — I10 Essential (primary) hypertension: Secondary | ICD-10-CM | POA: Insufficient documentation

## 2012-04-12 DIAGNOSIS — K589 Irritable bowel syndrome without diarrhea: Secondary | ICD-10-CM | POA: Insufficient documentation

## 2012-04-12 DIAGNOSIS — D219 Benign neoplasm of connective and other soft tissue, unspecified: Secondary | ICD-10-CM | POA: Insufficient documentation

## 2012-04-12 DIAGNOSIS — R32 Unspecified urinary incontinence: Secondary | ICD-10-CM | POA: Insufficient documentation

## 2012-04-12 DIAGNOSIS — R351 Nocturia: Secondary | ICD-10-CM

## 2012-04-12 DIAGNOSIS — Z23 Encounter for immunization: Secondary | ICD-10-CM

## 2012-04-12 DIAGNOSIS — E7800 Pure hypercholesterolemia, unspecified: Secondary | ICD-10-CM | POA: Insufficient documentation

## 2012-04-12 MED ORDER — ESTRADIOL 0.025 MG/24HR TD PTWK: 1.0000 | MEDICATED_PATCH | TRANSDERMAL | Status: DC

## 2012-04-12 NOTE — Progress Notes (Signed)
Patient came to see me today for further followup. Starting this summer she is beginning to have hot flashes again. She was on hormone replacement therapy previously. She's been off for many years. She is now having vaginal dryness. She is now having vaginal bleeding. She has nocturia twice a night. She has loss of urine with coughing laughing and sneezing. She does not have urgency. She has had several normal bone densities. The last one was 2009. She had a total Abdominal hysterectomy in 1985 for fibroids. She has never had cervical or vaginal dysplasia. Her last Pap smear was 2011. She is up-to-date on mammograms. She is having no pelvic pain. She does not have coronary artery disease.  ROS: 12 system review done. Pertinent positives above. Other positives include hyperlipidemia, diabetes mellitus 2, hypertension, esophageal stricture, GERD, degenerative joint disease, tenosynovitis of wrist and Hand.  HEENT: Within normal limits.Kennon Portela present. Neck: No masses. Supraclavicular lymph nodes: Not enlarged. Breasts: Examined in both sitting and lying position. Symmetrical without skin changes or masses. Abdomen: Soft no masses guarding or rebound. No hernias. Pelvic: External within normal limits. BUS within normal limits. Vaginal examination shows good estrogen effect, no cystocele enterocele or rectocele. Cervix and uterus absent. Adnexa within normal limits. Rectovaginal confirmatory. Extremities within normal limits.  Assessment: #1. Hot flashes #2. Nocturia #3. Urinary stress incontinence  Plan: Climara patch 0.025 mg one weekly. Offered medication for nocturia. Patient declined. Kegel exercises. Call for urological consult when necessary. Pap not done.The new Pap smear guidelines were discussed with the patient. TSH checked because of hot flashes.

## 2012-04-12 NOTE — Patient Instructions (Signed)
Do Kegel exercises

## 2012-04-12 NOTE — Addendum Note (Signed)
Addended by: Dayna Barker on: 04/12/2012 10:48 AM   Modules accepted: Orders

## 2012-04-13 LAB — TSH: TSH: 1.326 u[IU]/mL (ref 0.350–4.500)

## 2012-04-16 ENCOUNTER — Encounter: Payer: Self-pay | Admitting: Obstetrics and Gynecology

## 2012-04-22 ENCOUNTER — Encounter: Payer: Self-pay | Admitting: Internal Medicine

## 2012-04-22 ENCOUNTER — Ambulatory Visit (INDEPENDENT_AMBULATORY_CARE_PROVIDER_SITE_OTHER): Payer: Medicare Other | Admitting: Internal Medicine

## 2012-04-22 ENCOUNTER — Other Ambulatory Visit (INDEPENDENT_AMBULATORY_CARE_PROVIDER_SITE_OTHER): Payer: Medicare Other

## 2012-04-22 VITALS — BP 120/62 | HR 95 | Temp 99.3°F | Ht 66.0 in | Wt 194.1 lb

## 2012-04-22 DIAGNOSIS — M79641 Pain in right hand: Secondary | ICD-10-CM

## 2012-04-22 DIAGNOSIS — M79609 Pain in unspecified limb: Secondary | ICD-10-CM

## 2012-04-22 DIAGNOSIS — K5790 Diverticulosis of intestine, part unspecified, without perforation or abscess without bleeding: Secondary | ICD-10-CM | POA: Insufficient documentation

## 2012-04-22 DIAGNOSIS — Z0000 Encounter for general adult medical examination without abnormal findings: Secondary | ICD-10-CM

## 2012-04-22 DIAGNOSIS — Z0001 Encounter for general adult medical examination with abnormal findings: Secondary | ICD-10-CM | POA: Insufficient documentation

## 2012-04-22 DIAGNOSIS — M79642 Pain in left hand: Secondary | ICD-10-CM | POA: Insufficient documentation

## 2012-04-22 DIAGNOSIS — Z79899 Other long term (current) drug therapy: Secondary | ICD-10-CM

## 2012-04-22 LAB — CBC WITH DIFFERENTIAL/PLATELET
Basophils Absolute: 0 10*3/uL (ref 0.0–0.1)
Basophils Relative: 0 % (ref 0.0–3.0)
Eosinophils Absolute: 0 10*3/uL (ref 0.0–0.7)
Eosinophils Relative: 0.7 % (ref 0.0–5.0)
HCT: 39.5 % (ref 36.0–46.0)
Hemoglobin: 12.9 g/dL (ref 12.0–15.0)
Lymphocytes Relative: 34 % (ref 12.0–46.0)
Lymphs Abs: 1.3 10*3/uL (ref 0.7–4.0)
MCHC: 32.8 g/dL (ref 30.0–36.0)
MCV: 86 fl (ref 78.0–100.0)
Monocytes Absolute: 0.1 10*3/uL (ref 0.1–1.0)
Monocytes Relative: 3.1 % (ref 3.0–12.0)
Neutro Abs: 2.4 10*3/uL (ref 1.4–7.7)
Neutrophils Relative %: 62.2 % (ref 43.0–77.0)
Platelets: 252 10*3/uL (ref 150.0–400.0)
RBC: 4.59 Mil/uL (ref 3.87–5.11)
RDW: 14.4 % (ref 11.5–14.6)
WBC: 3.9 10*3/uL — ABNORMAL LOW (ref 4.5–10.5)

## 2012-04-22 LAB — HEPATIC FUNCTION PANEL
ALT: 36 U/L — ABNORMAL HIGH (ref 0–35)
AST: 33 U/L (ref 0–37)
Albumin: 3.8 g/dL (ref 3.5–5.2)
Alkaline Phosphatase: 85 U/L (ref 39–117)
Bilirubin, Direct: 0.1 mg/dL (ref 0.0–0.3)
Total Bilirubin: 0.3 mg/dL (ref 0.3–1.2)
Total Protein: 7.9 g/dL (ref 6.0–8.3)

## 2012-04-22 LAB — URINALYSIS, ROUTINE W REFLEX MICROSCOPIC
Bilirubin Urine: NEGATIVE
Hgb urine dipstick: NEGATIVE
Ketones, ur: NEGATIVE
Leukocytes, UA: NEGATIVE
Nitrite: NEGATIVE
Specific Gravity, Urine: >=1.03 (ref 1.000–1.030)
Total Protein, Urine: NEGATIVE
Urine Glucose: NEGATIVE
Urobilinogen, UA: 0.2 (ref 0.0–1.0)
pH: 6 (ref 5.0–8.0)

## 2012-04-22 LAB — BASIC METABOLIC PANEL
BUN: 19 mg/dL (ref 6–23)
CO2: 27 mEq/L (ref 19–32)
Calcium: 9.3 mg/dL (ref 8.4–10.5)
Chloride: 104 mEq/L (ref 96–112)
Creatinine, Ser: 1.1 mg/dL (ref 0.4–1.2)
GFR: 63.01 mL/min (ref >60.00)
Glucose, Bld: 100 mg/dL — ABNORMAL HIGH (ref 70–99)
Potassium: 3.6 mEq/L (ref 3.5–5.1)
Sodium: 139 mEq/L (ref 135–145)

## 2012-04-22 LAB — LIPID PANEL
Cholesterol: 138 mg/dL (ref 0–200)
HDL: 47.7 mg/dL (ref >39.00)
LDL Cholesterol: 59 mg/dL (ref 0–99)
Total CHOL/HDL Ratio: 3
Triglycerides: 159 mg/dL — ABNORMAL HIGH (ref 0.0–149.0)
VLDL: 31.8 mg/dL (ref 0.0–40.0)

## 2012-04-22 LAB — SEDIMENTATION RATE: Sed Rate: 52 mm/hr — ABNORMAL HIGH (ref 0–22)

## 2012-04-22 LAB — TSH: TSH: 1.52 u[IU]/mL (ref 0.35–5.50)

## 2012-04-22 LAB — URIC ACID: Uric Acid, Serum: 6.6 mg/dL (ref 2.4–7.0)

## 2012-04-22 NOTE — Patient Instructions (Addendum)
Continue all other medications as before Please keep your appointments with your specialists as you have planned - Endocrinology You will be contacted regarding the referral for: Nerve test for the Upper Extremities to check the nerves Please go to LAB in the Basement for the blood and/or urine tests to be done today You will be contacted by phone if any changes need to be made immediately.  Otherwise, you will receive a letter about your results with an explanation. Please return in 1 year for your yearly visit, or sooner if needed, with Lab testing done 3-5 days before

## 2012-04-22 NOTE — Progress Notes (Signed)
Subjective:    Patient ID: Daisy Dillon, female    DOB: 09/10/1945, 66 y.o.   MRN: [id number]  HPI  Last seen 2008; Here for wellness and to re-stablish.   Overall doing ok;  Pt denies CP, worsening SOB, DOE, wheezing, orthopnea, PND, worsening LE edema, palpitations, dizziness or syncope.  Pt denies neurological change such as new Headache, facial or extremity weakness.  Pt denies polydipsia, polyuria, or low sugar symptoms. Pt states overall good compliance with treatment and medications, good tolerability, and trying to follow lower cholesterol diet.  Pt denies worsening depressive symptoms, suicidal ideation or panic. No fever, wt loss, night sweats, loss of appetite, or other constitutional symptoms.  Pt states good ability with ADL's, low fall risk, home safety reviewed and adequate, no significant changes in hearing or vision, and occasionally active with exercise.  Does have significant bilat hand pain with intermittent swelling but none today.  Not clear about numbness, denies weakness or neck pain, no fever or trama, has been mild ongoing for several months Past Medical History  Diagnosis Date  . Diabetes mellitus   . IBS (irritable bowel syndrome)   . GERD (gastroesophageal reflux disease)   . Elevated cholesterol   . Hiatal hernia   . Gastritis   . Diverticulosis of colon (without mention of hemorrhage)   . Stricture and stenosis of esophagus   . Hypertension   . Fibroid   . Urinary incontinence   . Positive ANA (antinuclear antibody) 04/23/2012   Past Surgical History  Procedure Date  . Abdominal hysterectomy 1985  . Cholecystectomy   . Knee surgery     arthroscopic    reports that she has never smoked. She has never used smokeless tobacco. She reports that she does not drink alcohol or use illicit drugs. family history includes Breast cancer in her maternal aunt; Diabetes in her mother; Hypertension in her mother; and Stroke in her brother. Allergies  Allergen Reactions    . Codeine Phosphate     REACTION: unspecified, nausea, vomiting   Current Outpatient Prescriptions on File Prior to Visit  Medication Sig Dispense Refill  . aspirin 81 MG tablet Take 81 mg by mouth daily.        . Calcium Carbonate-Vitamin D (CALCIUM + D PO) Take by mouth.        . Cholecalciferol (VITAMIN D3) 1000 UNITS CAPS Take 1 capsule by mouth daily.        Marland Kitchen dexlansoprazole (DEXILANT) 60 MG capsule Take 60 mg by mouth daily.      Marland Kitchen dicyclomine (BENTYL) 10 MG capsule TAKE ONE CAPSULE BY MOUTH THREE TIMES DAILY BEFORE  MEALS  90 capsule  0  . estradiol (CLIMARA - DOSED IN MG/24 HR) 0.025 mg/24hr Place 1 patch (0.025 mg total) onto the skin once a week.  4 patch  12  . LOSARTAN POTASSIUM PO Take by mouth.        . metFORMIN (GLUCOPHAGE) 500 MG tablet Take 500 mg by mouth 2 (two) times daily with a meal.        . Multiple Vitamin (MULTIVITAMIN) tablet Take 1 tablet by mouth daily.        . pioglitazone (ACTOS) 15 MG tablet Take 15 mg by mouth daily.        Marland Kitchen PRAVASTATIN SODIUM PO Take by mouth.        . triamterene-hydrochlorothiazide (DYAZIDE) 37.5-25 MG per capsule Take 1 capsule by mouth every morning.        Marland Kitchen  VERAPAMIL HCL PO Take by mouth.        . dexlansoprazole (DEXILANT) 60 MG capsule Take 1 capsule (60 mg total) by mouth daily.  30 capsule  6  . dexlansoprazole (DEXILANT) 60 MG capsule Take 1 capsule (60 mg total) by mouth daily.  30 capsule  11   Review of Systems Review of Systems  Constitutional: Negative for diaphoresis, activity change, appetite change and unexpected weight change.  HENT: Negative for hearing loss, ear pain, facial swelling, mouth sores and neck stiffness.   Eyes: Negative for pain, redness and visual disturbance.  Respiratory: Negative for shortness of breath and wheezing.   Cardiovascular: Negative for chest pain and palpitations.  Gastrointestinal: Negative for diarrhea, blood in stool, abdominal distention and rectal pain.  Genitourinary:  Negative for hematuria, flank pain and decreased urine volume.  Musculoskeletal: Negative for myalgias and joint swelling.  Skin: Negative for color change and wound.  Neurological: Negative for syncope and numbness.  Hematological: Negative for adenopathy.  Psychiatric/Behavioral: Negative for hallucinations, self-injury, decreased concentration and agitation.      Objective:   Physical Exam BP 120/62  Pulse 95  Temp 99.3 F (37.4 C) (Oral)  Ht 5\' 6"  (1.676 m)  Wt 194 lb 2 oz (88.055 kg)  BMI 31.33 kg/m2  SpO2 96% Physical Exam  VS noted Constitutional: Pt is oriented to person, place, and time. Appears well-developed and well-nourished.  HENT:  Head: Normocephalic and atraumatic.  Right Ear: External ear normal.  Left Ear: External ear normal.  Nose: Nose normal.  Mouth/Throat: Oropharynx is clear and moist.  Eyes: Conjunctivae and EOM are normal. Pupils are equal, round, and reactive to light.  Neck: Normal range of motion. Neck supple. No JVD present. No tracheal deviation present.  Cardiovascular: Normal rate, regular rhythm, normal heart sounds and intact distal pulses.   Pulmonary/Chest: Effort normal and breath sounds normal.  Abdominal: Soft. Bowel sounds are normal. There is no tenderness.  Musculoskeletal: Normal range of motion. Exhibits no edema.  Lymphadenopathy:  Has no cervical adenopathy.  Neurological: Pt is alert and oriented to person, place, and time. Pt has normal reflexes. No cranial nerve deficit.  Skin: Skin is warm and dry. No rash noted.  Psychiatric:  Has  normal mood and affect. Behavior is normal.  No active synovitis.  Bilat hands and wrist without swelling or tenderness    Assessment & Plan:

## 2012-04-23 ENCOUNTER — Encounter: Payer: Self-pay | Admitting: Internal Medicine

## 2012-04-23 DIAGNOSIS — R768 Other specified abnormal immunological findings in serum: Secondary | ICD-10-CM

## 2012-04-23 HISTORY — DX: Other specified abnormal immunological findings in serum: R76.8

## 2012-04-23 LAB — RHEUMATOID FACTOR: Rhuematoid fact SerPl-aCnc: <10 IU/mL (ref <=14)

## 2012-04-23 LAB — ANA: Anti Nuclear Antibody(ANA): POSITIVE — AB

## 2012-04-23 LAB — ANTI-NUCLEAR AB-TITER (ANA TITER): ANA Titer 1: 1:160 {titer} — ABNORMAL HIGH

## 2012-04-24 ENCOUNTER — Encounter: Payer: Self-pay | Admitting: Internal Medicine

## 2012-04-24 NOTE — Assessment & Plan Note (Signed)

## 2012-04-24 NOTE — Assessment & Plan Note (Addendum)
Etiology unclear, ? Inflammatory arthritis, vs DJD vs CTS, for labs, to f/u any worsening symptoms or concerns, for ncx/emg to r/o cts or other neuro issue

## 2012-05-04 ENCOUNTER — Other Ambulatory Visit: Payer: Self-pay | Admitting: Gastroenterology

## 2012-05-24 ENCOUNTER — Telehealth: Payer: Self-pay | Admitting: Internal Medicine

## 2012-05-24 NOTE — Telephone Encounter (Signed)
Ok to let pt know - Nerve test was normal  Ok to o/w continue to monitor for now

## 2012-05-24 NOTE — Telephone Encounter (Signed)
Patient informed of results.  

## 2012-06-05 ENCOUNTER — Other Ambulatory Visit: Payer: Self-pay | Admitting: Gastroenterology

## 2012-06-29 ENCOUNTER — Encounter: Payer: Medicare Other | Admitting: Internal Medicine

## 2012-07-08 ENCOUNTER — Other Ambulatory Visit: Payer: Self-pay | Admitting: Gastroenterology

## 2012-08-02 ENCOUNTER — Telehealth: Payer: Self-pay | Admitting: Gastroenterology

## 2012-08-02 MED ORDER — DICYCLOMINE HCL 10 MG PO CAPS: 10.0000 mg | ORAL_CAPSULE | Freq: Three times a day (TID) | ORAL | Status: DC

## 2012-08-02 NOTE — Telephone Encounter (Signed)
I called patient back, patient just wanted to know if she can bring in assistance form to help her get Dexilant at $80 a month instead of $164 a month.  Advised patient to bring in form and we will fill out our part and she just needs to fill out her part.  Told patient that she will need a follow up appointment in March or April.  Patient also requested refill on Bentyl, I advised patient I can send and will send one refill as well.

## 2012-08-11 ENCOUNTER — Telehealth: Payer: Self-pay | Admitting: *Deleted

## 2012-08-11 NOTE — Telephone Encounter (Signed)
Patient brought assistant form for Dexilant to be filled out.  I will have Dr. Jarold Motto sign and call patient tomorrow when it is ready for her to pick up.

## 2012-08-12 ENCOUNTER — Telehealth: Payer: Self-pay | Admitting: *Deleted

## 2012-08-12 NOTE — Telephone Encounter (Signed)
I called the patient and she will be up here to pick up her Dexilant forms for financial assistant.

## 2012-08-23 ENCOUNTER — Telehealth: Payer: Self-pay | Admitting: *Deleted

## 2012-08-23 NOTE — Telephone Encounter (Signed)
I called patient to inform her that her Dexilant was approved.  Patient said that she already received her letter and the shipment

## 2012-10-04 ENCOUNTER — Other Ambulatory Visit: Payer: Self-pay | Admitting: Gastroenterology

## 2012-11-01 ENCOUNTER — Other Ambulatory Visit: Payer: Self-pay | Admitting: Gastroenterology

## 2012-11-01 NOTE — Telephone Encounter (Signed)
PATIENT NEEDS AN OFFICE VISIT FOR FURTHER REFILLS  

## 2012-11-09 ENCOUNTER — Other Ambulatory Visit: Payer: Self-pay | Admitting: Gastroenterology

## 2012-11-11 ENCOUNTER — Encounter: Payer: Self-pay | Admitting: Internal Medicine

## 2012-11-11 ENCOUNTER — Ambulatory Visit (INDEPENDENT_AMBULATORY_CARE_PROVIDER_SITE_OTHER)
Admission: RE | Admit: 2012-11-11 | Discharge: 2012-11-11 | Disposition: A | Discharge status: Home / Self Care | Payer: Medicare Other | Source: Ambulatory Visit | Attending: Internal Medicine | Admitting: Internal Medicine

## 2012-11-11 ENCOUNTER — Telehealth: Payer: Self-pay

## 2012-11-11 ENCOUNTER — Ambulatory Visit (INDEPENDENT_AMBULATORY_CARE_PROVIDER_SITE_OTHER): Payer: Medicare Other | Admitting: Internal Medicine

## 2012-11-11 VITALS — BP 140/84 | HR 87 | Temp 98.7°F | Ht 66.0 in | Wt 188.0 lb

## 2012-11-11 DIAGNOSIS — M79675 Pain in left toe(s): Secondary | ICD-10-CM | POA: Insufficient documentation

## 2012-11-11 DIAGNOSIS — E119 Type 2 diabetes mellitus without complications: Secondary | ICD-10-CM

## 2012-11-11 DIAGNOSIS — M79609 Pain in unspecified limb: Secondary | ICD-10-CM

## 2012-11-11 DIAGNOSIS — E785 Hyperlipidemia, unspecified: Secondary | ICD-10-CM

## 2012-11-11 DIAGNOSIS — I10 Essential (primary) hypertension: Secondary | ICD-10-CM

## 2012-11-11 MED ORDER — NAPROXEN 500 MG PO TABS: 500.0000 mg | ORAL_TABLET | Freq: Two times a day (BID) | ORAL | Status: DC

## 2012-11-11 NOTE — Assessment & Plan Note (Signed)
stable overall by history and exam, recent data reviewed with pt, and pt to continue medical treatment as before,  to f/u any worsening symptoms or concerns BP Readings from Last 3 Encounters:  11/11/12 140/84  04/22/12 120/62  04/12/12 130/78

## 2012-11-11 NOTE — Assessment & Plan Note (Signed)
Possible fx vs severe strain - for film today, nsaid prn

## 2012-11-11 NOTE — Patient Instructions (Addendum)
Please take all new medication as prescribed Please continue all other medications as before, and refills have been done if requested. Please go to the XRAY Department in the Basement (go straight as you get off the elevator) for the x-ray testing You will be contacted by phone if any changes need to be made immediately.  Otherwise, you will receive a letter about your results with an explanation  Please remember to sign up for My Chart if you have not done so, as this will be important to you in the future with finding out test results, communicating by private email, and scheduling acute appointments online when needed.  Please return in 6 months, or sooner if needed, with Lab testing done 3-5 days before

## 2012-11-11 NOTE — Progress Notes (Signed)
Subjective:    Patient ID: Daisy Dillon, female    DOB: 1945/07/05, 67 y.o.   MRN: [id number]  HPI  Here to f/u; overall doing ok,  Pt denies chest pain, increased sob or doe, wheezing, orthopnea, PND, increased LE swelling, palpitations, dizziness or syncope.  Pt denies polydipsia, polyuria, or low sugar symptoms such as weakness or confusion improved with po intake.  Pt denies new neurological symptoms such as new headache, or facial or extremity weakness or numbness.   Pt states overall good compliance with meds, has been trying to follow lower cholesterol, diabetic diet, with wt overall stable,  but little exercise however. Did hit her left 5th toe on furniture quite hard, still severe pain/swelling extending up to ankle but no fever and tender at the toe, no redness/blueness or claudicaiton. Past Medical History  Diagnosis Date  . Diabetes mellitus   . IBS (irritable bowel syndrome)   . GERD (gastroesophageal reflux disease)   . Elevated cholesterol   . Hiatal hernia   . Gastritis   . Diverticulosis of colon (without mention of hemorrhage)   . Stricture and stenosis of esophagus   . Hypertension   . Fibroid   . Urinary incontinence   . Positive ANA (antinuclear antibody) 04/23/2012   Past Surgical History  Procedure Laterality Date  . Abdominal hysterectomy  1985  . Cholecystectomy    . Knee surgery      arthroscopic    reports that she has never smoked. She has never used smokeless tobacco. She reports that she does not drink alcohol or use illicit drugs. family history includes Breast cancer in her maternal aunt; Diabetes in her mother; Hypertension in her mother; and Stroke in her brother. Allergies  Allergen Reactions  . Codeine Phosphate     REACTION: unspecified, nausea, vomiting   Current Outpatient Prescriptions on File Prior to Visit  Medication Sig Dispense Refill  . aspirin 81 MG tablet Take 81 mg by mouth daily.        . Calcium Carbonate-Vitamin D (CALCIUM + D  PO) Take by mouth.        . Cholecalciferol (VITAMIN D3) 1000 UNITS CAPS Take 1 capsule by mouth daily.        Marland Kitchen dexlansoprazole (DEXILANT) 60 MG capsule Take 60 mg by mouth daily.      Marland Kitchen dicyclomine (BENTYL) 10 MG capsule TAKE ONE CAPSULE BY MOUTH 4 TIMES DAILY BEFORE MEALS AND AT BEDTIME  30 capsule  0  . estradiol (CLIMARA - DOSED IN MG/24 HR) 0.025 mg/24hr Place 1 patch (0.025 mg total) onto the skin once a week.  4 patch  12  . LOSARTAN POTASSIUM PO Take by mouth.        . metFORMIN (GLUCOPHAGE) 500 MG tablet Take 500 mg by mouth 2 (two) times daily with a meal.        . Multiple Vitamin (MULTIVITAMIN) tablet Take 1 tablet by mouth daily.        . pioglitazone (ACTOS) 15 MG tablet Take 15 mg by mouth daily.        Marland Kitchen PRAVASTATIN SODIUM PO Take by mouth.        . triamterene-hydrochlorothiazide (DYAZIDE) 37.5-25 MG per capsule Take 1 capsule by mouth every morning.        Marland Kitchen VERAPAMIL HCL PO Take by mouth.        . dexlansoprazole (DEXILANT) 60 MG capsule Take 1 capsule (60 mg total) by mouth daily.  30 capsule  6  .  dexlansoprazole (DEXILANT) 60 MG capsule Take 1 capsule (60 mg total) by mouth daily.  30 capsule  11   No current facility-administered medications on file prior to visit.   Review of Systems  Constitutional: Negative for unexpected weight change, or unusual diaphoresis  HENT: Negative for tinnitus.   Eyes: Negative for photophobia and visual disturbance.  Respiratory: Negative for choking and stridor.   Gastrointestinal: Negative for vomiting and blood in stool.  Genitourinary: Negative for hematuria and decreased urine volume.  Musculoskeletal: Negative for acute joint swelling Skin: Negative for color change and wound.  Neurological: Negative for tremors and numbness other than noted  Psychiatric/Behavioral: Negative for decreased concentration or  hyperactivity.       Objective:   Physical Exam BP 140/84  Pulse 87  Temp(Src) 98.7 F (37.1 C) (Oral)  Ht 5\' 6"   (1.676 m)  Wt 188 lb (85.276 kg)  BMI 30.36 kg/m2  SpO2 97% VS noted,  Constitutional: Pt appears well-developed and well-nourished.  HENT: Head: NCAT.  Right Ear: External ear normal.  Left Ear: External ear normal.  Eyes: Conjunctivae and EOM are normal. Pupils are equal, round, and reactive to light.  Neck: Normal range of motion. Neck supple.  Cardiovascular: Normal rate and regular rhythm.   Pulmonary/Chest: Effort normal and breath sounds normal.  Abd:  Soft, NT, non-distended, + BS Neurological: Pt is alert. Not confused  Skin: Skin is warm. No erythema.  Psychiatric: Pt behavior is normal. Thought content normal.     Assessment & Plan:

## 2012-11-11 NOTE — Assessment & Plan Note (Signed)
stable overall by history and exam, recent data reviewed with pt, and pt to continue medical treatment as before,  to f/u any worsening symptoms or concerns Lab Results  Component Value Date   LDLCALC 59 04/22/2012

## 2012-11-11 NOTE — Assessment & Plan Note (Signed)
stable overall by history and exam, recent data reviewed with pt, and pt to continue medical treatment as before,  to f/u any worsening symptoms or concerns Lab Results  Component Value Date   WBC 3.9* 04/22/2012   HGB 12.9 04/22/2012   HCT 39.5 04/22/2012   PLT 252.0 04/22/2012   GLUCOSE 100* 04/22/2012   CHOL 138 04/22/2012   TRIG 159.0* 04/22/2012   HDL 47.70 04/22/2012   LDLCALC 59 04/22/2012   ALT 36* 04/22/2012   AST 33 04/22/2012   NA 139 04/22/2012   K 3.6 04/22/2012   CL 104 04/22/2012   CREATININE 1.1 04/22/2012   BUN 19 04/22/2012   CO2 27 04/22/2012   TSH 1.52 04/22/2012

## 2012-11-11 NOTE — Telephone Encounter (Signed)
Updated med list per patient requested to remove patch due to cost.

## 2012-11-16 ENCOUNTER — Ambulatory Visit (INDEPENDENT_AMBULATORY_CARE_PROVIDER_SITE_OTHER): Payer: Medicare Other | Admitting: Gastroenterology

## 2012-11-16 ENCOUNTER — Encounter: Payer: Self-pay | Admitting: Gastroenterology

## 2012-11-16 VITALS — BP 122/70 | HR 109 | Ht 65.35 in | Wt 188.4 lb

## 2012-11-16 DIAGNOSIS — R198 Other specified symptoms and signs involving the digestive system and abdomen: Secondary | ICD-10-CM

## 2012-11-16 DIAGNOSIS — F458 Other somatoform disorders: Secondary | ICD-10-CM

## 2012-11-16 DIAGNOSIS — R0989 Other specified symptoms and signs involving the circulatory and respiratory systems: Secondary | ICD-10-CM

## 2012-11-16 DIAGNOSIS — K5730 Diverticulosis of large intestine without perforation or abscess without bleeding: Secondary | ICD-10-CM

## 2012-11-16 DIAGNOSIS — K219 Gastro-esophageal reflux disease without esophagitis: Secondary | ICD-10-CM

## 2012-11-16 DIAGNOSIS — Z8719 Personal history of other diseases of the digestive system: Secondary | ICD-10-CM

## 2012-11-16 NOTE — Progress Notes (Signed)
This is a 67 year old African American female who has had chronic GERD, diverticulosis and IBS for many years.  She currently is doing fairly well on Dexilant 60 mg a day, but has a globus sensation in her throat and rather marked hoarseness.  It is of note that she is on Benadryl 10 mg 3 times a day for IBS.  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  There is no history of Raynaud's phenomenon or collagen vascular disease.  She denies dysphagia, hepatobiliary or specific lower GI complaints.  Her appetite is good her weight is stable, and she has lactose intolerance.  She does not smoke or abuse ethanol.  Her diabetes is well controlled on metformin 500 mg 2 tablets twice a day.  Primary care is Dr. Sanda Linger.  Current Medications, Allergies, Past Medical History, Past Surgical History, Family History and Social History were reviewed in [employer] record.  ROS: All systems were reviewed and are negative unless otherwise stated in the HPI.          Physical Exam: Blood pressure 120/70, weight 188, and BMI 31.01.  Pulse 86 and regular.  Sam and his oropharynx and neck areas are unremarkable.  Chest is clear and she is in a regular rhythm without murmurs gallops or rubs.  I cannot appreciate hepatosplenomegaly, abdominal masses or tenderness.  Bowel sounds are normal.  Mental status is normal.  Peripheral extremities are unremarkable.   Assessment and Plan: I suspect this patient's acid reflux is exacerbated by anticholinergic drug use.  I am somewhat concerned about her persistent globus sensation, institution of NSAIDs, and I've scheduled followup endoscopy at her convenience.  I reviewed antireflux regime with her.  She may need to discontinue her dicyclomine medications.  Otherwise, she is to continue her Dexilant 60 mg every morning 30 minutes before breakfast. No diagnosis found.

## 2012-11-16 NOTE — Addendum Note (Signed)
Addended by: Ok Anis A on: 11/16/2012 05:06 PM   Modules accepted: Orders

## 2012-11-16 NOTE — Patient Instructions (Addendum)
You have been scheduled for an endoscopy with propofol. Please follow written instructions given to you at your visit today. If you use inhalers (even only as needed), please bring them with you on the day of your procedure. Your physician has requested that you go to www.[email or web address] and enter the access code given to you at your visit today. This web site gives a general overview about your procedure. However, you should still follow specific instructions given to you by our office regarding your preparation for the procedure.  Please follow diabetic instructions on prep sheet. _____________________________________________________________________________                                               We are excited to introduce MyChart, a new best-in-class service that provides you online access to important information in your electronic medical record. We want to make it easier for you to view your health information - all in one secure location - when and where you need it. We expect MyChart will enhance the quality of care and service we provide.  When you register for MyChart, you can:    View your test results.    Request appointments and receive appointment reminders via email.    Request medication renewals.    View your medical history, allergies, medications and immunizations.    Communicate with your physician's office through a password-protected site.    Conveniently print information such as your medication lists.  To find out if MyChart is right for you, please talk to a member of our clinical staff today. We will gladly answer your questions about this free health and wellness tool.  If you are age 68 or older and want a member of your family to have access to your record, you must provide written consent by completing a proxy form available at our office. Please speak to our clinical staff about guidelines regarding accounts for patients younger than age 60.  As you  activate your MyChart account and need any technical assistance, please call the MyChart technical support line at [phone]) or email your question to [email or web address]. If you email your question(s), please include your name, a return phone number and the best time to reach you.  If you have non-urgent health-related questions, you can send a message to our office through MyChart at [email or web address]. If you have a medical emergency, call 911.  Thank you for using MyChart as your new health and wellness resource!   MyChart licensed from [employer],  4540-9811. Patents Pending.

## 2012-11-17 ENCOUNTER — Encounter: Payer: Self-pay | Admitting: Gastroenterology

## 2012-11-21 ENCOUNTER — Other Ambulatory Visit: Payer: Self-pay | Admitting: Gastroenterology

## 2012-11-22 NOTE — Telephone Encounter (Signed)
I suspect this patient's acid reflux is exacerbated by anticholinergic drug use. I am somewhat concerned about her persistent globus sensation, institution of NSAIDs, and I've scheduled followup endoscopy at her convenience. I reviewed antireflux regime with her. She may need to discontinue her dicyclomine medications. Otherwise, she is to continue her Dexilant 60 mg every morning 30 minutes before breakfast.  No diagnosis found.  Note above from last office visit, do you want me to refill dicyclomine?

## 2012-12-14 ENCOUNTER — Telehealth: Payer: Self-pay | Admitting: *Deleted

## 2012-12-14 MED ORDER — ESTRADIOL 0.5 MG PO TABS: 0.5000 mg | ORAL_TABLET | Freq: Every day | ORAL | Status: DC

## 2012-12-14 NOTE — Telephone Encounter (Signed)
RX SENT, PT INFORMED 

## 2012-12-14 NOTE — Telephone Encounter (Signed)
Call in generic Estrace 0.5 mg 1 PO QD #30 refill 2

## 2012-12-14 NOTE — Telephone Encounter (Signed)
(  DR. Reece Agar PATIENT) PT WAS TAKING CLIMARA PATCH 0.25 MG WEEKLY MEDICATION HAS NOW INCREASED IN PRICE, SHE ASKED IF A PILL FORM OF HRT COULD BE SENT TO PHARMACY? I SAID APPOINTMENT  WOULD BE BETTER, PT ASKED ME TO ASK IF YOU WOULD SEE WITHOUT OV, ANNUAL DUE IN October.

## 2012-12-20 ENCOUNTER — Other Ambulatory Visit: Payer: Self-pay | Admitting: *Deleted

## 2012-12-21 ENCOUNTER — Other Ambulatory Visit: Payer: Self-pay | Admitting: Endocrinology

## 2012-12-21 MED ORDER — TRIAMTERENE-HCTZ 37.5-25 MG PO CAPS: 1.0000 | ORAL_CAPSULE | ORAL | Status: DC

## 2012-12-21 MED ORDER — LOSARTAN POTASSIUM 50 MG PO TABS: 50.0000 mg | ORAL_TABLET | Freq: Every day | ORAL | Status: DC

## 2012-12-24 ENCOUNTER — Encounter: Payer: Self-pay | Admitting: Gastroenterology

## 2012-12-24 ENCOUNTER — Other Ambulatory Visit: Payer: Self-pay | Admitting: *Deleted

## 2012-12-24 ENCOUNTER — Ambulatory Visit (AMBULATORY_SURGERY_CENTER): Payer: Medicare Other | Admitting: Gastroenterology

## 2012-12-24 VITALS — BP 119/63 | HR 64 | Temp 97.4°F | Resp 15 | Ht 65.0 in | Wt 188.0 lb

## 2012-12-24 DIAGNOSIS — K219 Gastro-esophageal reflux disease without esophagitis: Secondary | ICD-10-CM

## 2012-12-24 DIAGNOSIS — K2970 Gastritis, unspecified, without bleeding: Secondary | ICD-10-CM

## 2012-12-24 DIAGNOSIS — F458 Other somatoform disorders: Secondary | ICD-10-CM

## 2012-12-24 DIAGNOSIS — K2990 Gastroduodenitis, unspecified, without bleeding: Secondary | ICD-10-CM

## 2012-12-24 DIAGNOSIS — R198 Other specified symptoms and signs involving the digestive system and abdomen: Secondary | ICD-10-CM

## 2012-12-24 DIAGNOSIS — R0989 Other specified symptoms and signs involving the circulatory and respiratory systems: Secondary | ICD-10-CM

## 2012-12-24 HISTORY — PX: ESOPHAGOGASTRODUODENOSCOPY: SHX1529

## 2012-12-24 MED ORDER — SODIUM CHLORIDE 0.9 % IV SOLN: 500.0000 mL | INTRAVENOUS | Status: DC

## 2012-12-24 MED ORDER — LOSARTAN POTASSIUM 50 MG PO TABS: 50.0000 mg | ORAL_TABLET | Freq: Every day | ORAL | Status: DC

## 2012-12-24 NOTE — Op Note (Signed)
[facility] [address] [place], [address]   ENDOSCOPY PROCEDURE REPORT  PATIENT: Daisy Dillon, Daisy Dillon  MR#: [id number] BIRTHDATE: 01-17-1946 , 66  yrs. old GENDER: Female ENDOSCOPIST:Iness Pangilinan Hale Bogus, MD, [facility] REFERRED BY: PROCEDURE DATE:  12/24/2012 PROCEDURE:   EGD w/ biopsy for H.pylori and Maloney dilation of esophagus ASA CLASS:    Class II INDICATIONS: History of esophageal reflux, Dysphagia, and globus sensation. MEDICATION: propofol (Diprivan) 100mg  IV TOPICAL ANESTHETIC:  DESCRIPTION OF PROCEDURE:   After the risks and benefits of the procedure were explained, informed consent was obtained.  The LB [id number]  endoscope was introduced through the mouth  and advanced to the second portion of the duodenum .  The instrument was slowly withdrawn as the mucosa was fully examined.      DUODENUM: The duodenal mucosa showed no abnormalities in the bulb and second portion of the duodenum.  STOMACH: There was mild antral gastropathy noted.Scattered hyperplastic polyps noted in fundus.  Cold forcep biopsies were taken at the antrum. ..CLO Bx.  ESOPHAGUS: The mucosa of the esophagus appeared normal.Dilated #28F Maloney dilator    Retroflexed views revealed no abnormalities. The scope was then withdrawn from the patient and the procedure completed.  COMPLICATIONS: There were no complications.   ENDOSCOPIC IMPRESSION: 1.   The duodenal mucosa showed no abnormalities in the bulb and second portion of the duodenum 2.   There was mild antral gastropathy noted [T2] 3.   The mucosa of the esophagus appeared normal ..hx of nonerosive GERD and probable secondary globus sensation in throat,no stricture noted.  RECOMMENDATIONS: 1.  Await biopsy results..treat if CLO +. 2.  Continue PPI 3. May need high resolution esophageal manometry exam 3.  Rx CLO if positive      _______________________________ eSigned:  Mardella Layman, MD, [facility] 12/24/2012  3:13 PM   standard discharge   PATIENT NAME:  Daisy Dillon, Daisy Dillon MR#: [id number]

## 2012-12-24 NOTE — Progress Notes (Signed)
Patient did not experience any of the following events: a burn prior to discharge; a fall within the facility; wrong site/side/patient/procedure/implant event; or a hospital transfer or hospital admission upon discharge from the facility. (G8907) Patient did not have preoperative order for IV antibiotic SSI prophylaxis. (G8918)  

## 2012-12-24 NOTE — Patient Instructions (Addendum)
YOU HAD AN ENDOSCOPIC PROCEDURE TODAY AT THE [facility]: Refer to the procedure report that was given to you for any specific questions about what was found during the examination.  If the procedure report does not answer your questions, please call your gastroenterologist to clarify.  If you requested that your care partner not be given the details of your procedure findings, then the procedure report has been included in a sealed envelope for you to review at your convenience later.  YOU SHOULD EXPECT: Some feelings of bloating in the abdomen. Passage of more gas than usual.  Walking can help get rid of the air that was put into your GI tract during the procedure and reduce the bloating. If you had a lower endoscopy (such as a colonoscopy or flexible sigmoidoscopy) you may notice spotting of blood in your stool or on the toilet paper. If you underwent a bowel prep for your procedure, then you may not have a normal bowel movement for a few days.  DIET: Your first meal following the procedure should be a light meal and then it is ok to progress to your normal diet.  A half-sandwich or bowl of soup is an example of a good first meal.  Heavy or fried foods are harder to digest and may make you feel nauseous or bloated.  Likewise meals heavy in dairy and vegetables can cause extra gas to form and this can also increase the bloating.  Drink plenty of fluids but you should avoid alcoholic beverages for 24 hours.  ACTIVITY: Your care partner should take you home directly after the procedure.  You should plan to take it easy, moving slowly for the rest of the day.  You can resume normal activity the day after the procedure however you should NOT DRIVE or use heavy machinery for 24 hours (because of the sedation medicines used during the test).    SYMPTOMS TO REPORT IMMEDIATELY: A gastroenterologist can be reached at any hour.  During normal business hours, 8:30 AM to 5:00 PM Monday through Friday,  call [phone].  After hours and on weekends, please call the GI answering service at [phone] who will take a message and have the physician on call contact you.    Following upper endoscopy (EGD)  Vomiting of blood or coffee ground material  New chest pain or pain under the shoulder blades  Painful or persistently difficult swallowing  New shortness of breath  Fever of 100F or higher  Black, tarry-looking stools  FOLLOW UP: If any biopsies were taken you will be contacted by phone or by letter within the next 1-3 weeks.  Call your gastroenterologist if you have not heard about the biopsies in 3 weeks.  Our staff will call the home number listed on your records the next business day following your procedure to check on you and address any questions or concerns that you may have at that time regarding the information given to you following your procedure. This is a courtesy call and so if there is no answer at the home number and we have not heard from you through the emergency physician on call, we will assume that you have returned to your regular daily activities without incident.  SIGNATURES/CONFIDENTIALITY: You and/or your care partner have signed paperwork which will be entered into your electronic medical record.  These signatures attest to the fact that that the information above on your After Visit Summary has been reviewed and is understood.  Full   responsibility of the confidentiality of this discharge information lies with you and/or your care-partner.   Await biopsy results and treat if clo is positive.  Post dilation diet given with instructions.   Call Dr. Silvano Rusk office next week to let him know how you are doing,  May need a manometry exam.

## 2012-12-24 NOTE — Progress Notes (Signed)
Called to room to assist during endoscopic procedure.  Patient ID and intended procedure confirmed with present staff. Received instructions for my participation in the procedure from the performing physician.  

## 2012-12-24 NOTE — Progress Notes (Signed)
Stable to RR 

## 2012-12-27 ENCOUNTER — Telehealth: Payer: Self-pay | Admitting: *Deleted

## 2012-12-27 ENCOUNTER — Encounter: Payer: Self-pay | Admitting: Gastroenterology

## 2012-12-27 LAB — HELICOBACTER PYLORI SCREEN-BIOPSY: UREASE: NEGATIVE

## 2012-12-27 NOTE — Telephone Encounter (Signed)
  Follow up Call-  Call back number 12/24/2012  Post procedure Call Back phone  # [phone]  Permission to leave phone message Yes     Patient questions:  Do you have a fever, pain , or abdominal swelling? no Pain Score  0 *  Have you tolerated food without any problems? yes  Have you been able to return to your normal activities? yes  Do you have any questions about your discharge instructions: Diet   no Medications  no Follow up visit  no  Do you have questions or concerns about your Care? no  Actions: * If pain score is 4 or above: No action needed, pain <4.

## 2012-12-30 ENCOUNTER — Telehealth: Payer: Self-pay | Admitting: Gastroenterology

## 2012-12-30 NOTE — Telephone Encounter (Signed)
Spoke with pt and she's feeling fine. States she still has a feeling in her throat 1st thing in the morning, but Dr Jarold Motto told her to talk Dexilant as soon as she woke up. We discussed him recommending a EM and she will call if no better.

## 2013-01-04 ENCOUNTER — Other Ambulatory Visit: Payer: Self-pay | Admitting: Gynecology

## 2013-01-04 DIAGNOSIS — Z1231 Encounter for screening mammogram for malignant neoplasm of breast: Secondary | ICD-10-CM

## 2013-01-17 ENCOUNTER — Other Ambulatory Visit: Payer: Self-pay | Admitting: *Deleted

## 2013-01-19 ENCOUNTER — Other Ambulatory Visit: Payer: Self-pay | Admitting: *Deleted

## 2013-01-19 ENCOUNTER — Other Ambulatory Visit: Payer: Self-pay

## 2013-01-19 DIAGNOSIS — E119 Type 2 diabetes mellitus without complications: Secondary | ICD-10-CM

## 2013-01-19 DIAGNOSIS — E785 Hyperlipidemia, unspecified: Secondary | ICD-10-CM

## 2013-01-21 ENCOUNTER — Other Ambulatory Visit: Payer: Medicare Other

## 2013-01-21 ENCOUNTER — Other Ambulatory Visit (INDEPENDENT_AMBULATORY_CARE_PROVIDER_SITE_OTHER): Payer: Medicare Other

## 2013-01-21 DIAGNOSIS — E785 Hyperlipidemia, unspecified: Secondary | ICD-10-CM

## 2013-01-21 DIAGNOSIS — E119 Type 2 diabetes mellitus without complications: Secondary | ICD-10-CM

## 2013-01-21 LAB — COMPREHENSIVE METABOLIC PANEL
ALT: 22 U/L (ref 0–35)
AST: 24 U/L (ref 0–37)
Albumin: 4 g/dL (ref 3.5–5.2)
Alkaline Phosphatase: 85 U/L (ref 39–117)
BUN: 19 mg/dL (ref 6–23)
CO2: 25 mEq/L (ref 19–32)
Calcium: 9.8 mg/dL (ref 8.4–10.5)
Chloride: 106 mEq/L (ref 96–112)
Creatinine, Ser: 1.2 mg/dL (ref 0.4–1.2)
GFR: 56.91 mL/min — ABNORMAL LOW (ref >60.00)
Glucose, Bld: 116 mg/dL — ABNORMAL HIGH (ref 70–99)
Potassium: 3.8 mEq/L (ref 3.5–5.1)
Sodium: 139 mEq/L (ref 135–145)
Total Bilirubin: 0.5 mg/dL (ref 0.3–1.2)
Total Protein: 8.3 g/dL (ref 6.0–8.3)

## 2013-01-21 LAB — LIPID PANEL
Cholesterol: 117 mg/dL (ref 0–200)
HDL: 53.3 mg/dL (ref >39.00)
LDL Cholesterol: 48 mg/dL (ref 0–99)
Total CHOL/HDL Ratio: 2
Triglycerides: 79 mg/dL (ref 0.0–149.0)
VLDL: 15.8 mg/dL (ref 0.0–40.0)

## 2013-01-21 LAB — MICROALBUMIN / CREATININE URINE RATIO
Creatinine,U: 302.3 mg/dL
Microalb Creat Ratio: 0.3 mg/g (ref 0.0–30.0)
Microalb, Ur: 0.9 mg/dL (ref 0.0–1.9)

## 2013-01-21 LAB — URINALYSIS, ROUTINE W REFLEX MICROSCOPIC
Bilirubin Urine: NEGATIVE
Hgb urine dipstick: NEGATIVE
Nitrite: NEGATIVE
RBC / HPF: NONE SEEN (ref 0–?)
Specific Gravity, Urine: 1.025 (ref 1.000–1.030)
Total Protein, Urine: NEGATIVE
Urine Glucose: NEGATIVE
Urobilinogen, UA: 1 (ref 0.0–1.0)
pH: 5.5 (ref 5.0–8.0)

## 2013-01-21 LAB — HEMOGLOBIN A1C: Hgb A1c MFr Bld: 5.6 % (ref 4.6–6.5)

## 2013-01-24 ENCOUNTER — Other Ambulatory Visit: Payer: Self-pay | Admitting: *Deleted

## 2013-01-24 ENCOUNTER — Ambulatory Visit (HOSPITAL_COMMUNITY)
Admission: RE | Admit: 2013-01-24 | Discharge: 2013-01-24 | Disposition: A | Discharge status: Home / Self Care | Payer: Medicare Other | Source: Ambulatory Visit | Attending: Gynecology | Admitting: Gynecology

## 2013-01-24 DIAGNOSIS — Z1231 Encounter for screening mammogram for malignant neoplasm of breast: Secondary | ICD-10-CM | POA: Insufficient documentation

## 2013-01-24 LAB — HM MAMMOGRAPHY: HM Mammogram: NEGATIVE

## 2013-01-24 MED ORDER — METFORMIN HCL 500 MG PO TABS: 500.0000 mg | ORAL_TABLET | Freq: Two times a day (BID) | ORAL | Status: DC

## 2013-01-24 MED ORDER — TRIAMTERENE-HCTZ 37.5-25 MG PO CAPS: 1.0000 | ORAL_CAPSULE | ORAL | Status: DC

## 2013-01-25 ENCOUNTER — Other Ambulatory Visit: Payer: Self-pay | Admitting: *Deleted

## 2013-01-25 ENCOUNTER — Ambulatory Visit: Payer: Medicare Other | Admitting: Endocrinology

## 2013-02-03 ENCOUNTER — Ambulatory Visit (INDEPENDENT_AMBULATORY_CARE_PROVIDER_SITE_OTHER): Payer: Medicare Other | Admitting: Endocrinology

## 2013-02-03 ENCOUNTER — Encounter: Payer: Self-pay | Admitting: Endocrinology

## 2013-02-03 VITALS — BP 118/68 | HR 78 | Temp 97.9°F | Resp 10 | Ht 66.0 in | Wt 187.8 lb

## 2013-02-03 DIAGNOSIS — E785 Hyperlipidemia, unspecified: Secondary | ICD-10-CM

## 2013-02-03 DIAGNOSIS — E7800 Pure hypercholesterolemia, unspecified: Secondary | ICD-10-CM

## 2013-02-03 DIAGNOSIS — E119 Type 2 diabetes mellitus without complications: Secondary | ICD-10-CM

## 2013-02-03 NOTE — Progress Notes (Signed)
Patient ID: Daisy Dillon, female   DOB: May 23, 1946, 67 y.o.   MRN: [id number]  Daisy Dillon is an 67 y.o. female.   Reason for Appointment: Diabetes follow-up   History of Present Illness   Diagnosis: Type 2 DIABETES MELITUS, date of diagnosis:   2002   She usually has had upper normal A1c with monotherapy  using metformin. Previously on Actos also which was stopped because of cost She has not checked her sugar much lately and did not bring her meter              Proper timing of medications in relation to meals: Yes.         Monitors blood glucose: Once a day.    Glucometer: ?         Blood Glucose readings from diary: readings before breakfast: 94, other readings: 122 127 137 163  Hypoglycemia frequency: Never.          Meals: 3 meals per day.          Physical activity: exercise:  Irregular           Complications: are: No history of neuropathy      Wt Readings from Last 3 Encounters:  02/03/13 187 lb 12.8 oz (85.186 kg)  12/24/12 188 lb (85.276 kg)  11/16/12 188 lb 6 oz (85.446 kg)    No visits with results within 1 Week(s) from this visit. Latest known visit with results is:  Appointment on 01/21/2013  Component Date Value Range Status  . Hemoglobin A1C 01/21/2013 5.6  4.6 - 6.5 % Final   Glycemic Control Guidelines for People with Diabetes:Non Diabetic:  <6%Goal of Therapy: <7%Additional Action Suggested:  >8%   . Microalb, Ur 01/21/2013 0.9  0.0 - 1.9 mg/dL Final  . Creatinine,U 14/78/2956 302.3   Final  . Microalb Creat Ratio 01/21/2013 0.3  0.0 - 30.0 mg/g Final  . Sodium 01/21/2013 139  135 - 145 mEq/L Final  . Potassium 01/21/2013 3.8  3.5 - 5.1 mEq/L Final  . Chloride 01/21/2013 106  96 - 112 mEq/L Final  . CO2 01/21/2013 25  19 - 32 mEq/L Final  . Glucose, Bld 01/21/2013 116* 70 - 99 mg/dL Final  . BUN 21/30/8657 19  6 - 23 mg/dL Final  . Creatinine, Ser 01/21/2013 1.2  0.4 - 1.2 mg/dL Final  . Total Bilirubin 01/21/2013 0.5  0.3 - 1.2 mg/dL Final  .  Alkaline Phosphatase 01/21/2013 85  39 - 117 U/L Final  . AST 01/21/2013 24  0 - 37 U/L Final  . ALT 01/21/2013 22  0 - 35 U/L Final  . Total Protein 01/21/2013 8.3  6.0 - 8.3 g/dL Final  . Albumin 84/69/6295 4.0  3.5 - 5.2 g/dL Final  . Calcium 28/41/3244 9.8  8.4 - 10.5 mg/dL Final  . GFR 06/18/7251 56.91* >60.00 mL/min Final  . Cholesterol 01/21/2013 117  0 - 200 mg/dL Final   ATP III Classification       Desirable:  < 200 mg/dL               Borderline High:  200 - 239 mg/dL          High:  > = 664 mg/dL  . Triglycerides 01/21/2013 79.0  0.0 - 149.0 mg/dL Final   Normal:  <403 mg/dLBorderline High:  150 - 199 mg/dL  . HDL 01/21/2013 53.30  >39.00 mg/dL Final  . VLDL 47/42/5956 15.8  0.0 - 40.0 mg/dL Final  .  LDL Cholesterol 01/21/2013 48  0 - 99 mg/dL Final  . Total CHOL/HDL Ratio 01/21/2013 2   Final                  Men          Women1/2 Average Risk     3.4          3.3Average Risk          5.0          4.42X Average Risk          9.6          7.13X Average Risk          15.0          11.0                      . Color, Urine 01/21/2013 LT. YELLOW  Yellow;Lt. Yellow Final  . APPearance 01/21/2013 CLEAR  Clear Final  . Specific Gravity, Urine 01/21/2013 1.025  1.000-1.030 Final  . pH 01/21/2013 5.5  5.0 - 8.0 Final  . Total Protein, Urine 01/21/2013 NEGATIVE  Negative Final  . Urine Glucose 01/21/2013 NEGATIVE  Negative Final  . Ketones, ur 01/21/2013 TRACE  Negative Final  . Bilirubin Urine 01/21/2013 NEGATIVE  Negative Final  . Hgb urine dipstick 01/21/2013 NEGATIVE  Negative Final  . Urobilinogen, UA 01/21/2013 1.0  0.0 - 1.0 Final  . Leukocytes, UA 01/21/2013 TRACE  Negative Final  . Nitrite 01/21/2013 NEGATIVE  Negative Final  . WBC, UA 01/21/2013 3-6/hpf  0-2/hpf Final  . RBC / HPF 01/21/2013 none seen  0-2/hpf Final  . Squamous Epithelial / LPF 01/21/2013 Rare(0-4/hpf)  Rare(0-4/hpf) Final      Medication List       This list is accurate as of: 02/03/13  3:26 PM.   Always use your most recent med list.               aspirin 81 MG tablet  Take 81 mg by mouth daily.     CALCIUM + D PO  Take by mouth.     DEXILANT 60 MG capsule  Generic drug:  dexlansoprazole  Take 60 mg by mouth daily.     dicyclomine 10 MG capsule  Commonly known as:  BENTYL  TAKE ONE CAPSULE BY MOUTH 4 TIMES DAILY BEFORE MEALS AND AT BEDTIME     estradiol 0.5 MG tablet  Commonly known as:  ESTRACE  Take 1 tablet (0.5 mg total) by mouth daily.     KLOR-CON M10 10 MEQ tablet  Generic drug:  potassium chloride     losartan 50 MG tablet  Commonly known as:  COZAAR  Take 1 tablet (50 mg total) by mouth daily.     losartan 50 MG tablet  Commonly known as:  COZAAR  Take 1 tablet (50 mg total) by mouth daily.     metFORMIN 500 MG tablet  Commonly known as:  GLUCOPHAGE  Take 1 tablet (500 mg total) by mouth 2 (two) times daily with a meal.     multivitamin tablet  Take 1 tablet by mouth daily.     naproxen 500 MG tablet  Commonly known as:  NAPROSYN  Take 1 tablet (500 mg total) by mouth 2 (two) times daily with a meal.     pioglitazone 15 MG tablet  Commonly known as:  ACTOS  Take 15 mg by mouth daily.     PRAVASTATIN SODIUM PO  Take by mouth.     triamterene-hydrochlorothiazide 37.5-25 MG per capsule  Commonly known as:  DYAZIDE  Take 1 each (1 capsule total) by mouth every morning.     VERAPAMIL HCL PO  Take by mouth.     Vitamin D3 1000 UNITS Caps  Take 1 capsule by mouth daily.        Allergies:  Allergies  Allergen Reactions  . Codeine Phosphate     REACTION: unspecified, nausea, vomiting    Past Medical History  Diagnosis Date  . Diabetes mellitus   . IBS (irritable bowel syndrome)   . GERD (gastroesophageal reflux disease)   . Elevated cholesterol   . Hiatal hernia   . Gastritis   . Diverticulosis of colon (without mention of hemorrhage)   . Stricture and stenosis of esophagus   . Hypertension   . Fibroid   . Urinary  incontinence   . Positive ANA (antinuclear antibody) 04/23/2012    Past Surgical History  Procedure Laterality Date  . Abdominal hysterectomy  1985  . Cholecystectomy    . Knee surgery      arthroscopic  . Colonoscopy      Family History  Problem Relation Age of Onset  . Diabetes Mother   . Hypertension Mother   . Breast cancer Maternal Aunt     Age 38's  . Stroke Brother     Social History:  reports that she has never smoked. She has never used smokeless tobacco. She reports that she does not drink alcohol or use illicit drugs.  Review of Systems:  HYPERTENSION:  monitors blood pressure periodically at home and it is reading relatively high with systolic readings 138-146  HYPERLIPIDEMIA: The lipid abnormality consists of elevated LDL which is well-controlled on pravastatin     Examination:   BP 118/68  Pulse 78  Temp(Src) 97.9 F (36.6 C)  Resp 10  Ht 5\' 6"  (1.676 m)  Wt 187 lb 12.8 oz (85.186 kg)  BMI 30.33 kg/m2  SpO2 97%  Body mass index is 30.33 kg/(m^2).   ASSESSMENT/ PLAN::   Diabetes type 2   The patient's diabetes control appears to be excellent with upper normal A1c  However she has not been exercising and stressed importance of doing this consistently Discussed checking blood sugars at various times of the day including postprandial  HYPERLIPIDEMIA: Well controlled with LDL 48  Hypertension: Good readings in the office today, hypokalemia has been controlled with potassium supplements  Kecia Swoboda 02/03/2013, 3:26 PM

## 2013-02-03 NOTE — Patient Instructions (Addendum)
More walking, other exercise daily

## 2013-02-11 ENCOUNTER — Other Ambulatory Visit: Payer: Self-pay | Admitting: *Deleted

## 2013-02-11 MED ORDER — VERAPAMIL HCL ER 240 MG PO CP24: 240.0000 mg | ORAL_CAPSULE | Freq: Every day | ORAL | Status: DC

## 2013-02-17 ENCOUNTER — Other Ambulatory Visit: Payer: Self-pay | Admitting: *Deleted

## 2013-02-18 ENCOUNTER — Other Ambulatory Visit: Payer: Self-pay | Admitting: *Deleted

## 2013-02-18 MED ORDER — VERAPAMIL HCL ER 240 MG PO CP24: 240.0000 mg | ORAL_CAPSULE | Freq: Every day | ORAL | Status: DC

## 2013-02-24 ENCOUNTER — Telehealth: Payer: Self-pay | Admitting: *Deleted

## 2013-02-24 MED ORDER — ESTRADIOL 0.52 MG/0.87 GM (0.06%) TD GEL: TRANSDERMAL | Status: DC

## 2013-02-24 NOTE — Telephone Encounter (Signed)
Does the directions suppose to say apply 1 pump to arm at bedtime nightly? Please advise

## 2013-02-24 NOTE — Telephone Encounter (Signed)
rx sent, pt informed.  

## 2013-02-24 NOTE — Telephone Encounter (Signed)
Pt was prescribed estradiol 0.05 mg tablets on 12/14/12. Pt was on climara patch previously but the price increased and pt unable to afford. Pt said with the estradiol pill it make her itch. Pt asked if something else could be given? Pt annual scheduled for 04/07/13. Please advise

## 2013-02-24 NOTE — Telephone Encounter (Signed)
Yes

## 2013-02-24 NOTE — Telephone Encounter (Signed)
Check to see if her insurance company will cover Elestrin 0.06% to apply Transderm weight 21 on only at bedtime. One bottle with 10 refills

## 2013-02-28 ENCOUNTER — Telehealth: Payer: Self-pay | Admitting: *Deleted

## 2013-02-28 NOTE — Telephone Encounter (Signed)
(  late entry this was faxed on 02/25/13) Pharmacy faxed over prior authorization request for elestrin 0.06 %. Stacy from [employer] called and said this medication was approved and tier acceptance was approved as well effective 02/22/14/14-9/14/15. pharmacy will be informed as well.

## 2013-03-18 ENCOUNTER — Telehealth: Payer: Self-pay | Admitting: Endocrinology

## 2013-03-18 MED ORDER — PIOGLITAZONE HCL 15 MG PO TABS: 15.0000 mg | ORAL_TABLET | Freq: Every day | ORAL | Status: DC

## 2013-03-18 NOTE — Telephone Encounter (Signed)
rx sent

## 2013-03-30 ENCOUNTER — Other Ambulatory Visit: Payer: Self-pay

## 2013-03-30 MED ORDER — NAPROXEN 500 MG PO TABS: 500.0000 mg | ORAL_TABLET | Freq: Two times a day (BID) | ORAL | Status: DC

## 2013-03-31 ENCOUNTER — Other Ambulatory Visit: Payer: Self-pay | Admitting: Endocrinology

## 2013-04-13 ENCOUNTER — Encounter: Payer: Self-pay | Admitting: Gynecology

## 2013-04-13 ENCOUNTER — Ambulatory Visit (INDEPENDENT_AMBULATORY_CARE_PROVIDER_SITE_OTHER): Payer: Medicare Other | Admitting: Gynecology

## 2013-04-13 VITALS — BP 126/82 | Ht 65.5 in | Wt 192.0 lb

## 2013-04-13 DIAGNOSIS — Z23 Encounter for immunization: Secondary | ICD-10-CM

## 2013-04-13 DIAGNOSIS — Z79890 Hormone replacement therapy (postmenopausal): Secondary | ICD-10-CM

## 2013-04-13 DIAGNOSIS — M899 Disorder of bone, unspecified: Secondary | ICD-10-CM

## 2013-04-13 DIAGNOSIS — R32 Unspecified urinary incontinence: Secondary | ICD-10-CM

## 2013-04-13 DIAGNOSIS — N952 Postmenopausal atrophic vaginitis: Secondary | ICD-10-CM

## 2013-04-13 DIAGNOSIS — Z780 Asymptomatic menopausal state: Secondary | ICD-10-CM

## 2013-04-13 DIAGNOSIS — N951 Menopausal and female climacteric states: Secondary | ICD-10-CM

## 2013-04-13 DIAGNOSIS — M8580 Other specified disorders of bone density and structure, unspecified site: Secondary | ICD-10-CM

## 2013-04-13 MED ORDER — ESTRADIOL 0.52 MG/0.87 GM (0.06%) TD GEL: 1.0000 "application " | Freq: Every day | TRANSDERMAL | Status: DC

## 2013-04-13 NOTE — Progress Notes (Signed)
Daisy Dillon 15-Dec-1945 [id number]   History:    67 y.o.  For GYN followup as a result of her hot flashes. Patient is currently on Climara 0.025 mg q. Weekly but she states that sometimes the patch falls and also is becoming too expensive. She wanted to look at other alternatives. The patient had been started on estrogen replacement therapy last year but had been off all hormones for several years prior to that. She had had history of vaginal dryness as well the past. She denies any vaginal bleeding. She does have nocturia and has been working on her Kegel exercises for her stress urinary incontinence although she is on a diuretic for hypertension.  She has had several normal bone densities. The last one was 2009. She had a total Abdominal hysterectomy in 1985 for fibroids. She has never had cervical or vaginal dysplasia. Her last Pap smear was 2011. She is up-to-date on mammograms. She is having no pelvic pain. She does not have coronary artery disease. Patient's last colonoscopy was less than 10 years and she states that she has never had any polyps or any family history of GI malignancy. She states that her shingles and Tdap vaccine are up-to-date and she would like have the flu vaccine today. She has had issues with abdominal cramps for which she has been followed the past by the gastroenterologist. 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  Dr. Lucianne Muss has been following her for her hypertension and type 2 diabetes and has been doing her lab work. Her PCP is Dr. Yetta Barre.   Past medical history,surgical history, family history and social history were all reviewed and documented in the EPIC chart.  Gynecologic History No LMP recorded. Patient has had a hysterectomy. Contraception: status post hysterectomy Last Pap: 2011. Results were: normal Last mammogram: 2014. Results were: normal  Obstetric History OB History  Gravida Para Term Preterm AB SAB TAB Ectopic Multiple Living  2 2 2       2     # Outcome  Date GA Lbr Len/2nd Weight Sex Delivery Anes PTL Lv  2 TRM           1 TRM                ROS: A ROS was performed and pertinent positives and negatives are included in the history.  GENERAL: No fevers or chills. HEENT: No change in vision, no earache, sore throat or sinus congestion. NECK: No pain or stiffness. CARDIOVASCULAR: No chest pain or pressure. No palpitations. PULMONARY: No shortness of breath, cough or wheeze. GASTROINTESTINAL: No abdominal pain, nausea, vomiting or diarrhea, melena or bright red blood per rectum. GENITOURINARY: No urinary frequency, urgency, hesitancy or dysuria. MUSCULOSKELETAL: No joint or muscle pain, no back pain, no recent trauma. DERMATOLOGIC: No rash, no itching, no lesions. ENDOCRINE: No polyuria, polydipsia, no heat or cold intolerance. No recent change in weight. HEMATOLOGICAL: No anemia or easy bruising or bleeding. NEUROLOGIC: No headache, seizures, numbness, tingling or weakness. PSYCHIATRIC: No depression, no loss of interest in normal activity or change in sleep pattern.     Exam: chaperone present  BP 126/82  Ht 5' 5.5" (1.664 m)  Wt 192 lb (87.091 kg)  BMI 31.45 kg/m2  Body mass index is 31.45 kg/(m^2).  General appearance : Well developed well nourished female. No acute distress HEENT: Neck supple, trachea midline, no carotid bruits, no thyroidmegaly Lungs: Clear to auscultation, no rhonchi or wheezes, or rib retractions  Heart: Regular rate and rhythm, no  murmurs or gallops Breast:Examined in sitting and supine position were symmetrical in appearance, no palpable masses or tenderness,  no skin retraction, no nipple inversion, no nipple discharge, no skin discoloration, no axillary or supraclavicular lymphadenopathy Abdomen: no palpable masses or tenderness, no rebound or guarding Extremities: no edema or skin discoloration or tenderness  Pelvic:  Bartholin, Urethra, Skene Glands: Within normal limits             Vagina: No gross lesions  or discharge  Cervix:absence  Uterus  Absence  Adnexa  Without masses or tenderness  Anus and perineum  normal   Rectovaginal  normal sphincter tone without palpated masses or tenderness             Hemoccult card provided by PCP     Assessment/Plan:  67 y.o. female with no evidence of cystocele or rectocele. On coughing and bearing down today patient did not leak urine. She will continue to do her Kegel exercises. Blood work drawn by her PCP. Pap smear not done today in accordance to the new guidelines. Patient will schedule her bone density study. Patient receive flu vaccine today. Patient will be started on Elestrin 0.06% transabdominal E21 on only each bedtime for vasomotor symptoms. Patient fully aware of risk of breast cancer although small. The risks benefits and pros and cons were discussed. All questions were answered and we'll follow accordingly. We discussed the importance of calcium and vitamin D and regular exercise for osteoporosis prevention.  Note: This dictation was prepared with  Dragon/digital dictation along withSmart phrase technology. Any transcriptional errors that result from this process are unintentional.   Ok Edwards MD, 10:47 AM 04/13/2013

## 2013-04-13 NOTE — Patient Instructions (Addendum)
Tetanus, Diphtheria, Pertussis (Tdap) Vaccine What You Need to Know WHY GET VACCINATED? Tetanus, diphtheria and pertussis can be very serious diseases, even for adolescents and adults. Tdap vaccine can protect us from these diseases. TETANUS (Lockjaw) causes painful muscle tightening and stiffness, usually all over the body.  It can lead to tightening of muscles in the head and neck so you can't open your mouth, swallow, or sometimes even breathe. Tetanus kills about 1 out of 5 people who are infected. DIPHTHERIA can cause a thick coating to form in the back of the throat.  It can lead to breathing problems, paralysis, heart failure, and death. PERTUSSIS (Whooping Cough) causes severe coughing spells, which can cause difficulty breathing, vomiting and disturbed sleep.  It can also lead to weight loss, incontinence, and rib fractures. Up to 2 in 100 adolescents and 5 in 100 adults with pertussis are hospitalized or have complications, which could include pneumonia and death. These diseases are caused by bacteria. Diphtheria and pertussis are spread from person to person through coughing or sneezing. Tetanus enters the body through cuts, scratches, or wounds. Before vaccines, the [place] saw as many as 200,000 cases a year of diphtheria and pertussis, and hundreds of cases of tetanus. Since vaccination began, tetanus and diphtheria have dropped by about 99% and pertussis by about 80%. TDAP VACCINE Tdap vaccine can protect adolescents and adults from tetanus, diphtheria, and pertussis. One dose of Tdap is routinely given at age 11 or 12. People who did not get Tdap at that age should get it as soon as possible. Tdap is especially important for health care professionals and anyone having close contact with a baby younger than 12 months. Pregnant women should get a dose of Tdap during every pregnancy, to protect the newborn from pertussis. Infants are most at risk for severe, life-threatening  complications from pertussis. A similar vaccine, called Td, protects from tetanus and diphtheria, but not pertussis. A Td booster should be given every 10 years. Tdap may be given as one of these boosters if you have not already gotten a dose. Tdap may also be given after a severe cut or burn to prevent tetanus infection. Your doctor can give you more information. Tdap may safely be given at the same time as other vaccines. SOME PEOPLE SHOULD NOT GET THIS VACCINE  If you ever had a life-threatening allergic reaction after a dose of any tetanus, diphtheria, or pertussis containing vaccine, OR if you have a severe allergy to any part of this vaccine, you should not get Tdap. Tell your doctor if you have any severe allergies.  If you had a coma, or long or multiple seizures within 7 days after a childhood dose of DTP or DTaP, you should not get Tdap, unless a cause other than the vaccine was found. You can still get Td.  Talk to your doctor if you:  have epilepsy or another nervous system problem,  had severe pain or swelling after any vaccine containing diphtheria, tetanus or pertussis,  ever had Guillain-Barr Syndrome (GBS),  aren't feeling well on the day the shot is scheduled. RISKS OF A VACCINE REACTION With any medicine, including vaccines, there is a chance of side effects. These are usually mild and go away on their own, but serious reactions are also possible. Brief fainting spells can follow a vaccination, leading to injuries from falling. Sitting or lying down for about 15 minutes can help prevent these. Tell your doctor if you feel dizzy or light-headed, or   have vision changes or ringing in the ears. Mild problems following Tdap (Did not interfere with activities)  Pain where the shot was given (about 3 in 4 adolescents or 2 in 3 adults)  Redness or swelling where the shot was given (about 1 person in 5)  Mild fever of at least 100.68F (up to about 1 in 25 adolescents or 1 in  100 adults)  Headache (about 3 or 4 people in 10)  Tiredness (about 1 person in 3 or 4)  Nausea, vomiting, diarrhea, stomach ache (up to 1 in 4 adolescents or 1 in 10 adults)  Chills, body aches, sore joints, rash, swollen glands (uncommon) Moderate problems following Tdap (Interfered with activities, but did not require medical attention)  Pain where the shot was given (about 1 in 5 adolescents or 1 in 100 adults)  Redness or swelling where the shot was given (up to about 1 in 16 adolescents or 1 in 25 adults)  Fever over 102F (about 1 in 100 adolescents or 1 in 250 adults)  Headache (about 3 in 20 adolescents or 1 in 10 adults)  Nausea, vomiting, diarrhea, stomach ache (up to 1 or 3 people in 100)  Swelling of the entire arm where the shot was given (up to about 3 in 100). Severe problems following Tdap (Unable to perform usual activities, required medical attention)  Swelling, severe pain, bleeding and redness in the arm where the shot was given (rare). A severe allergic reaction could occur after any vaccine (estimated less than 1 in a million doses). WHAT IF THERE IS A SERIOUS REACTION? What should I look for?  Look for anything that concerns you, such as signs of a severe allergic reaction, very high fever, or behavior changes. Signs of a severe allergic reaction can include hives, swelling of the face and throat, difficulty breathing, a fast heartbeat, dizziness, and weakness. These would start a few minutes to a few hours after the vaccination. What should I do?  If you think it is a severe allergic reaction or other emergency that can't wait, call 9-1-1 or get the person to the nearest hospital. Otherwise, call your doctor.  Afterward, the reaction should be reported to the "Vaccine Adverse Event Reporting System" (VAERS). Your doctor might file this report, or you can do it yourself through the VAERS web site at www.vaers.LAgents.no, or by calling 1-781-211-7943. VAERS is  only for reporting reactions. They do not give medical advice.  THE NATIONAL VACCINE INJURY COMPENSATION PROGRAM The National Vaccine Injury Compensation Program (VICP) is a federal program that was created to compensate people who may have been injured by certain vaccines. Persons who believe they may have been injured by a vaccine can learn about the program and about filing a claim by calling 1-548 478 6762 or visiting the VICP website at SpiritualWord.at. HOW CAN I LEARN MORE?  Ask your doctor.  Call your local or state health department.  Contact the Centers for Disease Control and Prevention (CDC):  Call (570)545-7413 or visit CDC's website at PicCapture.uy. CDC Tdap Vaccine VIS (10/23/11) Document Released: 12/02/2011 Document Revised: 02/25/2012 Document Reviewed: 12/02/2011 [employer] Patient Information 2014 [place], [place]. Influenza Vaccine (Flu Vaccine, Inactivated) 2013 2014 What You Need to Know WHY GET VACCINATED?  Influenza ("flu") is a contagious disease that spreads around the [place] every winter, usually between October and May.  Flu is caused by the influenza virus, and can be spread by coughing, sneezing, and close contact.  Anyone can get flu, but the risk  of getting flu is highest among children. Symptoms come on suddenly and may last several days. They can include:  Fever or chills.  Sore throat.  Muscle aches.  Fatigue.  Cough.  Headache.  Runny or stuffy nose. Flu can make some people much sicker than others. These people include young children, people 74 and older, pregnant women, and people with certain health conditions such as heart, lung or kidney disease, or a weakened immune system. Flu vaccine is especially important for these people, and anyone in close contact with them. Flu can also lead to pneumonia, and make existing medical conditions worse. It can cause diarrhea and seizures in children. Each year thousands  of people in the [place] die from flu, and many more are hospitalized. Flu vaccine is the best protection we have from flu and its complications. Flu vaccine also helps prevent spreading flu from person to person. INACTIVATED FLU VACCINE There are 2 types of influenza vaccine:  You are getting an inactivated flu vaccine, which does not contain any live influenza virus. It is given by injection with a needle, and often called the "flu shot."  A different live, attenuated (weakened) influenza vaccine is sprayed into the nostrils. This vaccine is described in a separate Vaccine Information Statement. Flu vaccine is recommended every year. Children 6 months through 54 years of age should get 2 doses the first year they get vaccinated. Flu viruses are always changing. Each year's flu vaccine is made to protect from viruses that are most likely to cause disease that year. While flu vaccine cannot prevent all cases of flu, it is our best defense against the disease. Inactivated flu vaccine protects against 3 or 4 different influenza viruses. It takes about 2 weeks for protection to develop after the vaccination, and protection lasts several months to a year. Some illnesses that are not caused by influenza virus are often mistaken for flu. Flu vaccine will not prevent these illnesses. It can only prevent influenza. A "high-dose" flu vaccine is available for people 61 years of age and older. The person giving you the vaccine can tell you more about it. Some inactivated flu vaccine contains a very small amount of a mercury-based preservative called thimerosal. Studies have shown that thimerosal in vaccines is not harmful, but flu vaccines that do not contain a preservative are available. SOME PEOPLE SHOULD NOT GET THIS VACCINE Tell the person who gives you the vaccine:  If you have any severe (life-threatening) allergies. If you ever had a life-threatening allergic reaction after a dose of flu vaccine,  or have a severe allergy to any part of this vaccine, you may be advised not to get a dose. Most, but not all, types of flu vaccine contain a small amount of egg.  If you ever had Guillain Barr Syndrome (a severe paralyzing illness, also called GBS). Some people with a history of GBS should not get this vaccine. This should be discussed with your doctor.  If you are not feeling well. They might suggest waiting until you feel better. But you should come back. RISKS OF A VACCINE REACTION With a vaccine, like any medicine, there is a chance of side effects. These are usually mild and go away on their own. Serious side effects are also possible, but are very rare. Inactivated flu vaccine does not contain live flu virus, sogetting flu from this vaccine is not possible. Brief fainting spells and related symptoms (such as jerking movements) can happen after any medical procedure, including  vaccination. Sitting or lying down for about 15 minutes after a vaccination can help prevent fainting and injuries caused by falls. Tell your doctor if you feel dizzy or lightheaded, or have vision changes or ringing in the ears. Mild problems following inactivated flu vaccine:  Soreness, redness, or swelling where the shot was given.  Hoarseness; sore, red or itchy eyes; or cough.  Fever.  Aches.  Headache.  Itching.  Fatigue. If these problems occur, they usually begin soon after the shot and last 1 or 2 days. Moderate problems following inactivated flu vaccine:  Young children who get inactivated flu vaccine and pneumococcal vaccine (PCV13) at the same time may be at increased risk for seizures caused by fever. Ask your doctor for more information. Tell your doctor if a child who is getting flu vaccine has ever had a seizure. Severe problems following inactivated flu vaccine:  A severe allergic reaction could occur after any vaccine (estimated less than 1 in a million doses).  There is a small  possibility that inactivated flu vaccine could be associated with Guillan Barr Syndrome (GBS), no more than 1 or 2 cases per million people vaccinated. This is much lower than the risk of severe complications from flu, which can be prevented by flu vaccine. The safety of vaccines is always being monitored. For more information, visit: http://floyd.org/ WHAT IF THERE IS A SERIOUS REACTION? What should I look for?  Look for anything that concerns you, such as signs of a severe allergic reaction, very high fever, or behavior changes. Signs of a severe allergic reaction can include hives, swelling of the face and throat, difficulty breathing, a fast heartbeat, dizziness, and weakness. These would start a few minutes to a few hours after the vaccination. What should I do?  If you think it is a severe allergic reaction or other emergency that cannot wait, call [phone] 1 1  or get the person to the nearest hospital. Otherwise, call your doctor.  Afterward, the reaction should be reported to the Vaccine Adverse Event Reporting System (VAERS). Your doctor might file this report, or you can do it yourself through the VAERS website at www.vaers.LAgents.no, or by calling 1-(862) 252-7736. VAERS is only for reporting reactions. They do not give medical advice. THE NATIONAL VACCINE INJURY COMPENSATION PROGRAM The National Vaccine Injury Compensation Program (VICP) is a federal program that was created to compensate people who may have been injured by certain vaccines. Persons who believe they may have been injured by a vaccine can learn about the program and about filing a claim by calling 1-405-245-1151 or visiting the VICP website at SpiritualWord.at HOW CAN I LEARN MORE?  Ask your doctor.  Call your local or state health department. Contact the Centers for Disease Control and Prevention (CDC): Bone Densitometry Bone densitometry is a special X-ray that measures your bone density and can be  used to help predict your risk of bone fractures. This test is used to determine bone mineral content and density to diagnose osteoporosis. Osteoporosis is the loss of bone that may cause the bone to become weak. Osteoporosis commonly occurs in women entering menopause. However, it may be found in men and in people with other diseases. PREPARATION FOR TEST No preparation necessary. WHO SHOULD BE TESTED? All women older than 36. Postmenopausal women (50 to 66) with risk factors for osteoporosis. People with a previous fracture caused by normal activities. People with a small body frame (less than 127 poundsor a body mass index [BMI] of less than 21).  People who have a parent with a hip fracture or history of osteoporosis. People who smoke. People who have rheumatoid arthritis. Anyone who engages in excessive alcohol use (more than 3 drinks most days). Women who experience early menopause. WHEN SHOULD YOU BE RETESTED? Current guidelines suggest that you should wait at least 2 years before doing a bone density test again if your first test was normal.Recent studies indicated that women with normal bone density may be able to wait a few years before needing to repeat a bone density test. You should discuss this with your caregiver.  NORMAL FINDINGS  Normal: less than standard deviation below normal (greater than -1). Osteopenia: 1 to 2.5 standard deviations below normal (-1 to -2.5). Osteoporosis: greater than 2.5 standard deviations below normal (less than -2.5). Test results are reported as a "T score" and a "Z score."The T score is a number that compares your bone density with the bone density of healthy, young women.The Z score is a number that compares your bone density with the scores of women who are the same age, gender, and race.  Ranges for normal findings may vary among different laboratories and hospitals. You should always check with your doctor after having lab work or other tests  done to discuss the meaning of your test results and whether your values are considered within normal limits. MEANING OF TEST  Your caregiver will go over the test results with you and discuss the importance and meaning of your results, as well as treatment options and the need for additional tests if necessary. OBTAINING THE TEST RESULTS It is your responsibility to obtain your test results. Ask the lab or department performing the test when and how you will get your results. Document Released: 06/24/2004 Document Revised: 08/25/2011 Document Reviewed: 07/17/2010 [facility] Patient Information 2014 [place], [place].    Call 8100963317 (1-800-CDC-INFO) or  Visit CDC's website at BiotechRoom.com.cy CDC Inactivated Influenza Vaccine Interim VIS (01/09/12) Document Released: 03/27/2006 Document Revised: 02/25/2012 Document Reviewed: 01/09/2012 [facility] Patient Information 2014 [place], [place].

## 2013-04-13 NOTE — Addendum Note (Signed)
Addended by: Bertram Savin A on: 04/13/2013 11:07 AM   Modules accepted: Orders

## 2013-04-18 ENCOUNTER — Encounter: Payer: Self-pay | Admitting: Obstetrics and Gynecology

## 2013-04-26 ENCOUNTER — Ambulatory Visit (INDEPENDENT_AMBULATORY_CARE_PROVIDER_SITE_OTHER): Payer: Medicare Other | Admitting: Internal Medicine

## 2013-04-26 ENCOUNTER — Encounter: Payer: Self-pay | Admitting: Internal Medicine

## 2013-04-26 ENCOUNTER — Other Ambulatory Visit (INDEPENDENT_AMBULATORY_CARE_PROVIDER_SITE_OTHER): Payer: Medicare Other

## 2013-04-26 VITALS — BP 118/70 | HR 94 | Temp 98.3°F | Ht 65.0 in | Wt 190.4 lb

## 2013-04-26 DIAGNOSIS — Z136 Encounter for screening for cardiovascular disorders: Secondary | ICD-10-CM

## 2013-04-26 DIAGNOSIS — M545 Low back pain, unspecified: Secondary | ICD-10-CM | POA: Insufficient documentation

## 2013-04-26 DIAGNOSIS — Z0000 Encounter for general adult medical examination without abnormal findings: Secondary | ICD-10-CM

## 2013-04-26 DIAGNOSIS — Z23 Encounter for immunization: Secondary | ICD-10-CM

## 2013-04-26 DIAGNOSIS — E119 Type 2 diabetes mellitus without complications: Secondary | ICD-10-CM

## 2013-04-26 LAB — CBC WITH DIFFERENTIAL/PLATELET
Basophils Absolute: 0 10*3/uL (ref 0.0–0.1)
Basophils Relative: 0.4 % (ref 0.0–3.0)
Eosinophils Absolute: 0 10*3/uL (ref 0.0–0.7)
Eosinophils Relative: 1.4 % (ref 0.0–5.0)
HCT: 37.9 % (ref 36.0–46.0)
Hemoglobin: 12.9 g/dL (ref 12.0–15.0)
Lymphocytes Relative: 38.8 % (ref 12.0–46.0)
Lymphs Abs: 1.3 10*3/uL (ref 0.7–4.0)
MCHC: 34 g/dL (ref 30.0–36.0)
MCV: 82.8 fl (ref 78.0–100.0)
Monocytes Absolute: 0.3 10*3/uL (ref 0.1–1.0)
Monocytes Relative: 7.8 % (ref 3.0–12.0)
Neutro Abs: 1.8 10*3/uL (ref 1.4–7.7)
Neutrophils Relative %: 51.6 % (ref 43.0–77.0)
Platelets: 279 10*3/uL (ref 150.0–400.0)
RBC: 4.58 Mil/uL (ref 3.87–5.11)
RDW: 14.4 % (ref 11.5–14.6)
WBC: 3.4 10*3/uL — ABNORMAL LOW (ref 4.5–10.5)

## 2013-04-26 LAB — TSH: TSH: 2.07 u[IU]/mL (ref 0.35–5.50)

## 2013-04-26 NOTE — Patient Instructions (Addendum)
You had the tetanus and new Prevnar pneumonia shot today Please continue all other medications as before, and refills have been done if requested. Please have the pharmacy call with any other refills you may need.  Please continue your efforts at being more active, low cholesterol diet, and weight control. You are otherwise up to date with prevention measures today.  Please go to the LAB in the Basement (turn left off the elevator) for the tests to be done today - just the thyroid and blood counts today You will be contacted by phone if any changes need to be made immediately.  Otherwise, you will receive a letter about your results with an explanation, but please check with MyChart first.  You will be contacted by phone if any changes need to be made immediately.  Otherwise, you will receive a letter about your results with an explanation, but please check with MyChart first.  Please keep your appointments with your specialists as you have planned - Dr Harrel Lemon will be contacted regarding the referral for: orthopedic  Please return in 1 year for your yearly visit, or sooner if needed, with Lab testing done 3-5 days before

## 2013-04-26 NOTE — Progress Notes (Signed)
Subjective:    Patient ID: Daisy Dillon, female    DOB: Dec 08, 1945, 67 y.o.   MRN: [id number]  HPI  Here for wellness and f/u;  Overall doing ok;  Pt denies CP, worsening SOB, DOE, wheezing, orthopnea, PND, worsening LE edema, palpitations, dizziness or syncope.  Pt denies neurological change such as new headache, facial or extremity weakness.  Pt denies polydipsia, polyuria, or low sugar symptoms. Pt states overall good compliance with treatment and medications, good tolerability, and has been trying to follow lower cholesterol diet.  Pt denies worsening depressive symptoms, suicidal ideation or panic. No fever, night sweats, wt loss, loss of appetite, or other constitutional symptoms.  Pt states good ability with ADL's, has low fall risk, home safety reviewed and adequate, no other significant changes in hearing or vision, and only occasionally active with exercise. Pt continues to have recurring right LBP without change in severity, bowel or bladder change, fever, wt loss,  worsening LE pain/numbness/weakness, gait change or falls, but can only stand less than 1 hr.  Past Medical History  Diagnosis Date  . Diabetes mellitus   . IBS (irritable bowel syndrome)   . GERD (gastroesophageal reflux disease)   . Elevated cholesterol   . Hiatal hernia   . Gastritis   . Diverticulosis of colon (without mention of hemorrhage)   . Stricture and stenosis of esophagus   . Hypertension   . Fibroid   . Urinary incontinence   . Positive ANA (antinuclear antibody) 04/23/2012   Past Surgical History  Procedure Laterality Date  . Abdominal hysterectomy  1985  . Cholecystectomy    . Knee surgery      arthroscopic  . Colonoscopy      reports that she has never smoked. She has never used smokeless tobacco. She reports that she does not drink alcohol or use illicit drugs. family history includes Breast cancer in her maternal aunt; Diabetes in her mother; Hypertension in her mother; Stroke in her  brother. Allergies  Allergen Reactions  . Codeine Phosphate     REACTION: unspecified, nausea, vomiting   Current Outpatient Prescriptions on File Prior to Visit  Medication Sig Dispense Refill  . aspirin 81 MG tablet Take 81 mg by mouth daily.        . Calcium Carbonate-Vitamin D (CALCIUM + D PO) Take by mouth.        . Cholecalciferol (VITAMIN D3) 1000 UNITS CAPS Take 1 capsule by mouth daily.        Marland Kitchen dexlansoprazole (DEXILANT) 60 MG capsule Take 60 mg by mouth daily.      . Estradiol 0.52 MG/0.87 GM (0.06%) GEL Apply 1 application topically daily. Apply to one arm daily only  1 Bottle  11  . KLOR-CON M10 10 MEQ tablet       . losartan (COZAAR) 50 MG tablet Take 1 tablet (50 mg total) by mouth daily.  90 tablet  3  . metFORMIN (GLUCOPHAGE) 500 MG tablet Take 1 tablet (500 mg total) by mouth 2 (two) times daily with a meal.  90 tablet  1  . Multiple Vitamin (MULTIVITAMIN) tablet Take 1 tablet by mouth daily.        . naproxen (NAPROSYN) 500 MG tablet Take 1 tablet (500 mg total) by mouth 2 (two) times daily with a meal.  60 tablet  2  . ONE TOUCH ULTRA TEST test strip USE AS DIRECTED EVERY DAY  50 each  6  . pioglitazone (ACTOS) 15 MG tablet Take 1  tablet (15 mg total) by mouth daily.  30 tablet  5  . PRAVASTATIN SODIUM PO Take by mouth.        . triamterene-hydrochlorothiazide (DYAZIDE) 37.5-25 MG per capsule Take 1 each (1 capsule total) by mouth every morning.  90 capsule  2  . verapamil (VERELAN PM) 240 MG 24 hr capsule Take 1 capsule (240 mg total) by mouth at bedtime.  90 capsule  1   No current facility-administered medications on file prior to visit.   Review of Systems Constitutional: Negative for diaphoresis, activity change, appetite change or unexpected weight change.  HENT: Negative for hearing loss, ear pain, facial swelling, mouth sores and neck stiffness.   Eyes: Negative for pain, redness and visual disturbance.  Respiratory: Negative for shortness of breath and  wheezing.   Cardiovascular: Negative for chest pain and palpitations.  Gastrointestinal: Negative for diarrhea, blood in stool, abdominal distention or other pain Genitourinary: Negative for hematuria, flank pain or change in urine volume.  Musculoskeletal: Negative for myalgias and joint swelling.  Skin: Negative for color change and wound.  Neurological: Negative for syncope and numbness. other than noted Hematological: Negative for adenopathy.  Psychiatric/Behavioral: Negative for hallucinations, self-injury, decreased concentration and agitation.      Objective:   Physical Exam BP 118/70  Pulse 94  Temp(Src) 98.3 F (36.8 C) (Oral)  Ht 5\' 5"  (1.651 m)  Wt 190 lb 6 oz (86.354 kg)  BMI 31.68 kg/m2  SpO2 97% VS noted,  Constitutional: Pt is oriented to person, place, and time. Appears well-developed and well-nourished.  Head: Normocephalic and atraumatic.  Right Ear: External ear normal.  Left Ear: External ear normal.  Nose: Nose normal.  Mouth/Throat: Oropharynx is clear and moist.  Eyes: Conjunctivae and EOM are normal. Pupils are equal, round, and reactive to light.  Neck: Normal range of motion. Neck supple. No JVD present. No tracheal deviation present.  Cardiovascular: Normal rate, regular rhythm, normal heart sounds and intact distal pulses.   Pulmonary/Chest: Effort normal and breath sounds normal.  Abdominal: Soft. Bowel sounds are normal. There is no tenderness. No HSM  Musculoskeletal: Normal range of motion. Exhibits no edema.  Lymphadenopathy:  Has no cervical adenopathy.  Neurological: Pt is alert and oriented to person, place, and time. Pt has normal reflexes. No cranial nerve deficit.  Skin: Skin is warm and dry. No rash noted.  Psychiatric:  Has  normal mood and affect. Behavior is normal.  Spine nontender, no paravertebral tender/spasm    Assessment & Plan:

## 2013-04-26 NOTE — Assessment & Plan Note (Signed)
Pt requests refer to ortho

## 2013-04-26 NOTE — Progress Notes (Signed)
Pre-visit discussion using our clinic review tool. No additional management support is needed unless otherwise documented below in the visit note.  

## 2013-04-26 NOTE — Addendum Note (Signed)
Addended by: Scharlene Gloss B on: 04/26/2013 01:57 PM   Modules accepted: Orders

## 2013-04-26 NOTE — Assessment & Plan Note (Signed)

## 2013-05-25 ENCOUNTER — Encounter: Payer: Self-pay | Admitting: Endocrinology

## 2013-05-25 LAB — HM DIABETES EYE EXAM

## 2013-06-02 ENCOUNTER — Ambulatory Visit (INDEPENDENT_AMBULATORY_CARE_PROVIDER_SITE_OTHER): Payer: Medicare Other

## 2013-06-02 ENCOUNTER — Other Ambulatory Visit: Payer: Self-pay | Admitting: Gynecology

## 2013-06-02 DIAGNOSIS — Z780 Asymptomatic menopausal state: Secondary | ICD-10-CM

## 2013-06-02 DIAGNOSIS — M8580 Other specified disorders of bone density and structure, unspecified site: Secondary | ICD-10-CM

## 2013-07-13 ENCOUNTER — Other Ambulatory Visit: Payer: Self-pay | Admitting: *Deleted

## 2013-07-13 MED ORDER — PRAVASTATIN SODIUM 20 MG PO TABS: 20.0000 mg | ORAL_TABLET | Freq: Every day | ORAL | Status: DC

## 2013-07-13 MED ORDER — LOSARTAN POTASSIUM 50 MG PO TABS: 50.0000 mg | ORAL_TABLET | Freq: Every day | ORAL | Status: DC

## 2013-07-18 ENCOUNTER — Telehealth: Payer: Self-pay

## 2013-07-18 ENCOUNTER — Other Ambulatory Visit: Payer: Self-pay | Admitting: Gynecology

## 2013-07-18 NOTE — Telephone Encounter (Signed)
I spoke with patient and let her know that [facility] can make her prefilled applicators of estrogen cream and a 3 mos supply (#36) will be $58.  They will even mail it to her if too far to drive to get it.  Patient said she will consider and let me know.  She said she had some Estradiol tabs left and she might want to try those again. She will let me know.

## 2013-07-18 NOTE — Telephone Encounter (Signed)
Patient said even with coupon you gave her Elestrin Gel is $159 with insurance.  She said you told her to let you know if it was too expensive for her.

## 2013-07-18 NOTE — Telephone Encounter (Signed)
Please call and prescription at the compounding pharmacy for estradiol 0.02% to apply intravaginally twice a week as well as a little bit on the outside of her vulva. Tell her this is the cheek this and will caused her less than $50 every 3 months. There will give her 3 month supply with 4 refills.

## 2013-07-18 NOTE — Telephone Encounter (Signed)
error 

## 2013-08-01 ENCOUNTER — Other Ambulatory Visit: Payer: Self-pay | Admitting: *Deleted

## 2013-08-01 ENCOUNTER — Other Ambulatory Visit (INDEPENDENT_AMBULATORY_CARE_PROVIDER_SITE_OTHER): Payer: Commercial Managed Care - HMO

## 2013-08-01 DIAGNOSIS — E119 Type 2 diabetes mellitus without complications: Secondary | ICD-10-CM

## 2013-08-01 LAB — LIPID PANEL
Cholesterol: 131 mg/dL (ref 0–200)
HDL: 51.5 mg/dL (ref >39.00)
LDL Cholesterol: 58 mg/dL (ref 0–99)
Total CHOL/HDL Ratio: 3
Triglycerides: 107 mg/dL (ref 0.0–149.0)
VLDL: 21.4 mg/dL (ref 0.0–40.0)

## 2013-08-01 LAB — COMPREHENSIVE METABOLIC PANEL
ALT: 25 U/L (ref 0–35)
AST: 29 U/L (ref 0–37)
Albumin: 4 g/dL (ref 3.5–5.2)
Alkaline Phosphatase: 85 U/L (ref 39–117)
BUN: 16 mg/dL (ref 6–23)
CO2: 26 mEq/L (ref 19–32)
Calcium: 9.8 mg/dL (ref 8.4–10.5)
Chloride: 104 mEq/L (ref 96–112)
Creatinine, Ser: 1.2 mg/dL (ref 0.4–1.2)
GFR: 56.82 mL/min — ABNORMAL LOW (ref >60.00)
Glucose, Bld: 89 mg/dL (ref 70–99)
Potassium: 3.7 mEq/L (ref 3.5–5.1)
Sodium: 140 mEq/L (ref 135–145)
Total Bilirubin: 0.3 mg/dL (ref 0.3–1.2)
Total Protein: 8.5 g/dL — ABNORMAL HIGH (ref 6.0–8.3)

## 2013-08-01 LAB — MICROALBUMIN / CREATININE URINE RATIO
Creatinine,U: 196.9 mg/dL
Microalb Creat Ratio: 0.1 mg/g (ref 0.0–30.0)
Microalb, Ur: 0.1 mg/dL (ref 0.0–1.9)

## 2013-08-01 LAB — HEMOGLOBIN A1C: Hgb A1c MFr Bld: 5.8 % (ref 4.6–6.5)

## 2013-08-05 ENCOUNTER — Other Ambulatory Visit: Payer: Self-pay | Admitting: *Deleted

## 2013-08-05 ENCOUNTER — Ambulatory Visit: Payer: Medicare Other | Admitting: Endocrinology

## 2013-08-05 MED ORDER — TRIAMTERENE-HCTZ 37.5-25 MG PO CAPS: 1.0000 | ORAL_CAPSULE | ORAL | Status: DC

## 2013-08-11 ENCOUNTER — Ambulatory Visit: Payer: Commercial Managed Care - HMO | Admitting: Endocrinology

## 2013-08-16 ENCOUNTER — Ambulatory Visit (INDEPENDENT_AMBULATORY_CARE_PROVIDER_SITE_OTHER): Payer: Commercial Managed Care - HMO | Admitting: Endocrinology

## 2013-08-16 ENCOUNTER — Encounter: Payer: Self-pay | Admitting: Endocrinology

## 2013-08-16 VITALS — BP 132/78 | HR 104 | Temp 97.6°F | Resp 16 | Ht 65.0 in | Wt 186.6 lb

## 2013-08-16 DIAGNOSIS — E119 Type 2 diabetes mellitus without complications: Secondary | ICD-10-CM

## 2013-08-16 DIAGNOSIS — I10 Essential (primary) hypertension: Secondary | ICD-10-CM

## 2013-08-16 NOTE — Patient Instructions (Signed)
Exercise  Please check blood sugars at least half the time about 2 hours after any meal and as directed on waking up. Please bring blood sugar monitor to each visit

## 2013-08-16 NOTE — Progress Notes (Signed)
Patient ID: Daisy Dillon, female   DOB: 05/26/1946, 68 y.o.   MRN: [id number]   Reason for Appointment: Diabetes follow-up   History of Present Illness   Diagnosis: Type 2 DIABETES MELITUS, date of diagnosis:   2002   She usually has had upper normal A1c fairly consistently; no side effects with low-dose metformin. Also on Actos low dose without any side effects She has had some fluctuation in her weight but relatively better recently Generally fairly compliant with diet. She checks her blood sugars periodically, doing before and after meals readings, mostly around lunch or supper              Monitors blood glucose: Once a day.    Glucometer: One touch ultra mini         Blood Glucose readings from diary: Range 87-143 at various times  Hypoglycemia: Never.          Meals: 3 meals per day.          Physical activity: exercise: Minimal recently because of low back pain           Complications: are: No history of neuropathy     Wt Readings from Last 3 Encounters:  08/16/13 186 lb 9.6 oz (84.641 kg)  04/26/13 190 lb 6 oz (86.354 kg)  04/13/13 192 lb (87.091 kg)    Lab Results  Component Value Date   HGBA1C 5.8 08/01/2013   HGBA1C 5.6 01/21/2013   Lab Results  Component Value Date   MICROALBUR 0.1 08/01/2013   LDLCALC 58 08/01/2013   CREATININE 1.2 08/01/2013     No visits with results within 1 Week(s) from this visit. Latest known visit with results is:  Appointment on 08/01/2013  Component Date Value Ref Range Status  . Hemoglobin A1C 08/01/2013 5.8  4.6 - 6.5 % Final   Glycemic Control Guidelines for People with Diabetes:Non Diabetic:  <6%Goal of Therapy: <7%Additional Action Suggested:  >8%   . Cholesterol 08/01/2013 131  0 - 200 mg/dL Final   ATP III Classification       Desirable:  < 200 mg/dL               Borderline High:  200 - 239 mg/dL          High:  > = 240 mg/dL  . Triglycerides 08/01/2013 107.0  0.0 - 149.0 mg/dL Final   Normal:  <150 mg/dLBorderline High:  150 -  199 mg/dL  . HDL 08/01/2013 51.50  >39.00 mg/dL Final  . VLDL 08/01/2013 21.4  0.0 - 40.0 mg/dL Final  . LDL Cholesterol 08/01/2013 58  0 - 99 mg/dL Final  . Total CHOL/HDL Ratio 08/01/2013 3   Final                  Men          Women1/2 Average Risk     3.4          3.3Average Risk          5.0          4.42X Average Risk          9.6          7.13X Average Risk          15.0          11.0                      . Microalb, Ur 08/01/2013 0.1  0.0 - 1.9 mg/dL Final  . Creatinine,U 08/01/2013 196.9   Final  . Microalb Creat Ratio 08/01/2013 0.1  0.0 - 30.0 mg/g Final  . Sodium 08/01/2013 140  135 - 145 mEq/L Final  . Potassium 08/01/2013 3.7  3.5 - 5.1 mEq/L Final  . Chloride 08/01/2013 104  96 - 112 mEq/L Final  . CO2 08/01/2013 26  19 - 32 mEq/L Final  . Glucose, Bld 08/01/2013 89  70 - 99 mg/dL Final  . BUN 08/01/2013 16  6 - 23 mg/dL Final  . Creatinine, Ser 08/01/2013 1.2  0.4 - 1.2 mg/dL Final  . Total Bilirubin 08/01/2013 0.3  0.3 - 1.2 mg/dL Final  . Alkaline Phosphatase 08/01/2013 85  39 - 117 U/L Final  . AST 08/01/2013 29  0 - 37 U/L Final  . ALT 08/01/2013 25  0 - 35 U/L Final  . Total Protein 08/01/2013 8.5* 6.0 - 8.3 g/dL Final  . Albumin 08/01/2013 4.0  3.5 - 5.2 g/dL Final  . Calcium 08/01/2013 9.8  8.4 - 10.5 mg/dL Final  . GFR 08/01/2013 56.82* >60.00 mL/min Final      Medication List       This list is accurate as of: 08/16/13  3:08 PM.  Always use your most recent med list.               acetaminophen 650 MG CR tablet  Commonly known as:  TYLENOL  Take 650 mg by mouth every 8 (eight) hours as needed for pain.     aspirin 81 MG tablet  Take 81 mg by mouth daily.     CALCIUM + D PO  Take by mouth.     DEXILANT 60 MG capsule  Generic drug:  dexlansoprazole  Take 60 mg by mouth daily.     EAR WAX REMOVAL DROPS 6.5 % otic solution  Generic drug:  carbamide peroxide     glucosamine-chondroitin 500-400 MG tablet  Take 1 tablet by mouth 3 (three) times  daily.     losartan 50 MG tablet  Commonly known as:  COZAAR  Take 1 tablet (50 mg total) by mouth daily.     metFORMIN 500 MG tablet  Commonly known as:  GLUCOPHAGE  Take 1 tablet (500 mg total) by mouth 2 (two) times daily with a meal.     multivitamin tablet  Take 1 tablet by mouth daily.     ONE TOUCH ULTRA TEST test strip  Generic drug:  glucose blood  USE AS DIRECTED EVERY DAY     pioglitazone 15 MG tablet  Commonly known as:  ACTOS  Take 1 tablet (15 mg total) by mouth daily.     pravastatin 20 MG tablet  Commonly known as:  PRAVACHOL  Take 1 tablet (20 mg total) by mouth daily.     triamterene-hydrochlorothiazide 37.5-25 MG per capsule  Commonly known as:  DYAZIDE  Take 1 each (1 capsule total) by mouth every morning.     verapamil 240 MG 24 hr capsule  Commonly known as:  VERELAN PM  Take 1 capsule (240 mg total) by mouth at bedtime.     Vitamin D3 1000 UNITS Caps  Take 1 capsule by mouth daily.        Allergies:  Allergies  Allergen Reactions  . Codeine Phosphate     REACTION: unspecified, nausea, vomiting    Past Medical History  Diagnosis Date  . Diabetes mellitus   . IBS (irritable bowel syndrome)   .  GERD (gastroesophageal reflux disease)   . Elevated cholesterol   . Hiatal hernia   . Gastritis   . Diverticulosis of colon (without mention of hemorrhage)   . Stricture and stenosis of esophagus   . Hypertension   . Fibroid   . Urinary incontinence   . Positive ANA (antinuclear antibody) 04/23/2012    Past Surgical History  Procedure Laterality Date  . Abdominal hysterectomy  1985  . Cholecystectomy    . Knee surgery      arthroscopic  . Colonoscopy      Family History  Problem Relation Age of Onset  . Diabetes Mother   . Hypertension Mother   . Breast cancer Maternal Aunt     Age 47's  . Stroke Brother     Social History:  reports that she has never smoked. She has never used smokeless tobacco. She reports that she does not  drink alcohol or use illicit drugs.  Review of Systems:  HYPERTENSION:  monitors blood pressure periodically at home: Recent reading 118/63  HYPERLIPIDEMIA: The lipid abnormality consists of elevated LDL which is well-controlled on pravastatin  Lab Results  Component Value Date   CHOL 131 08/01/2013   HDL 51.50 08/01/2013   LDLCALC 58 08/01/2013   TRIG 107.0 08/01/2013   CHOLHDL 3 08/01/2013       Examination:   BP 132/78  Pulse 104  Temp(Src) 97.6 F (36.4 C)  Resp 16  Ht 5\' 5"  (1.651 m)  Wt 186 lb 9.6 oz (84.641 kg)  BMI 31.05 kg/m2  SpO2 95%  Body mass index is 31.05 kg/(m^2).   ASSESSMENT/ PLAN::   Diabetes type 2   The patient's diabetes control appears to be excellent with upper normal A1c again However she has not been exercising and tends to gain weight. Discussed doing chair exercises and pamphlet given She was also given a new One Touch ultra mini monitor since her previous one is updated  HYPERLIPIDEMIA: Well controlled with LDL at target dosing protocol low dose  Hypertension: Good readings in the office today, hypokalemia has been controlled with potassium supplements  Luisdavid Hamblin 08/16/2013, 3:08 PM

## 2013-08-22 ENCOUNTER — Telehealth: Payer: Self-pay | Admitting: Gastroenterology

## 2013-08-22 NOTE — Telephone Encounter (Signed)
Former patient of Dr. Sharlett Iles with no preference for new GI MD. Calling because her Dexilant is not helping reflux. Scheduled with [name], NP on 08/31/13 at 10:30 AM per patient request.

## 2013-08-25 ENCOUNTER — Encounter: Payer: Self-pay | Admitting: *Deleted

## 2013-08-31 ENCOUNTER — Other Ambulatory Visit: Payer: Self-pay | Admitting: *Deleted

## 2013-08-31 ENCOUNTER — Ambulatory Visit (INDEPENDENT_AMBULATORY_CARE_PROVIDER_SITE_OTHER): Payer: Commercial Managed Care - HMO | Admitting: Nurse Practitioner

## 2013-08-31 ENCOUNTER — Encounter: Payer: Self-pay | Admitting: Nurse Practitioner

## 2013-08-31 ENCOUNTER — Telehealth: Payer: Self-pay | Admitting: Nurse Practitioner

## 2013-08-31 VITALS — BP 104/60 | HR 76 | Ht 65.0 in | Wt 186.8 lb

## 2013-08-31 DIAGNOSIS — R198 Other specified symptoms and signs involving the digestive system and abdomen: Secondary | ICD-10-CM

## 2013-08-31 DIAGNOSIS — F458 Other somatoform disorders: Secondary | ICD-10-CM

## 2013-08-31 DIAGNOSIS — R0989 Other specified symptoms and signs involving the circulatory and respiratory systems: Secondary | ICD-10-CM

## 2013-08-31 MED ORDER — VERAPAMIL HCL ER 240 MG PO CP24: 240.0000 mg | ORAL_CAPSULE | Freq: Every day | ORAL | Status: DC

## 2013-08-31 MED ORDER — METFORMIN HCL 500 MG PO TABS: 500.0000 mg | ORAL_TABLET | Freq: Two times a day (BID) | ORAL | Status: DC

## 2013-08-31 NOTE — Telephone Encounter (Signed)
Spoke with patient and she states [name], NP told her she was going to put her back on Dicyclomine. Please, advise.

## 2013-08-31 NOTE — Patient Instructions (Signed)
Please take Dexilant 60 mg, one capsule by mouth thirty minutes before breakfast.  Please purchase Zantac 150 mg over the counter and take one capsule by mouth at bedtime.  Please call Rollene Fare, RN in a couple of weeks to give an update on how you are feeling.

## 2013-08-31 NOTE — Progress Notes (Signed)
Reviewed and agree with management plan. If globus persists consider ENT referral and barium esophagram  Garland Smouse T. Fuller Plan, MD [facility]

## 2013-08-31 NOTE — Progress Notes (Signed)
     History of Present Illness:  This is a 68 year female known to Dr. Sharlett Iles for chronic GERD, diverticulosis, lactose intolerance and IBS. Patient was seen June 2014 for a globus sensation in her throat. Anticholinergics were felt to be contributing to her symptoms and therefore dicyclomine was discontinued. Her daily Dexilant was continued.      Patient here today for evaluation of persistent globus sensation, worse in am upon rising. No sinus drainage. No pyrosis. No dysphasia. No odynophagia. Usually as the day goes on, globus subsides. Patient tells me she has tried every PPI and nothing has helped the globus sensation.  Current Medications, Allergies, Past Medical History, Past Surgical History, Family History and Social History were reviewed in [employer] record.  Physical Exam: General: Pleasant, well developed , black female in no acute distress Head: Normocephalic and atraumatic Eyes:  sclerae anicteric, conjunctiva pink  Ears: Normal auditory acuity Lungs: Clear throughout to auscultation Heart: Regular rate and rhythm Abdomen: Soft, non distended, non-tender. No masses, no hepatomegaly. Normal bowel sounds Musculoskeletal: Symmetrical with no gross deformities  Extremities: No edema  Neurological: Alert oriented x 4, grossly nonfocal Psychological:  Alert and cooperative. Normal mood and affect  Assessment and Recommendations:  68 year old female with almost one year history of globus sensation refractory to numerous PPIs. No dysphagia, pyrosis. No sinus drainage. Globus mainly in am. In case this is in fact reflux related will add Zantac at bedtime. Patient will call us in a couple of weeks with a condition update. If no improvement will consider referral for [facility] manometry

## 2013-09-01 MED ORDER — DICYCLOMINE HCL 10 MG PO CAPS: ORAL_CAPSULE | ORAL | Status: DC

## 2013-09-01 NOTE — Telephone Encounter (Signed)
Spoke with [name], NP and rx for Dicyclomine 1 po BID prn sent to pharmacy. Also, [name], NP states patient should know that if the globus feeling remains after 2-3 weeks of Zantac, might need to be referred to [facility]. Patient aware.

## 2013-09-14 ENCOUNTER — Other Ambulatory Visit: Payer: Self-pay | Admitting: *Deleted

## 2013-09-14 MED ORDER — PIOGLITAZONE HCL 15 MG PO TABS: 15.0000 mg | ORAL_TABLET | Freq: Every day | ORAL | Status: DC

## 2013-09-20 ENCOUNTER — Telehealth: Payer: Self-pay | Admitting: Nurse Practitioner

## 2013-09-20 NOTE — Telephone Encounter (Signed)
Patient is calling to let [name], NP know the Zantac has not helped the globus feeling. She is not sure she wants to go to [facility]. Please, advise.

## 2013-09-22 ENCOUNTER — Telehealth: Payer: Self-pay | Admitting: Internal Medicine

## 2013-09-22 DIAGNOSIS — R070 Pain in throat: Secondary | ICD-10-CM

## 2013-09-22 NOTE — Telephone Encounter (Signed)
We was going to refer this pt to [facility] ENT  This referral has to be done by her PCP  Patient aware

## 2013-09-22 NOTE — Telephone Encounter (Signed)
Left message for patient to call back  

## 2013-09-22 NOTE — Telephone Encounter (Signed)
Done per emr 

## 2013-09-22 NOTE — Telephone Encounter (Signed)
Pt is having a problem with something in her throat all the time.  She called GI to request a referral to an ENT.  They told her it would need to come from her PCP.  She was taking dexilant and was told to take Zantac.  These have not helped.

## 2013-09-22 NOTE — Telephone Encounter (Signed)
Daisy Dillon, please see if she has even seen an ENT. If not then please refer for ENT evaluation. Thanks

## 2013-09-23 NOTE — Telephone Encounter (Signed)
Pt is aware that [facility] will contact her with an appt. °

## 2013-09-26 ENCOUNTER — Telehealth: Payer: Self-pay | Admitting: Nurse Practitioner

## 2013-09-26 NOTE — Telephone Encounter (Signed)
Spoke with patient and she will drop off her PAP for Dexilant tomorrow for MD to complete their portion.

## 2013-10-03 ENCOUNTER — Telehealth: Payer: Self-pay | Admitting: Nurse Practitioner

## 2013-10-03 NOTE — Telephone Encounter (Signed)
Kelly, do you know anything about this?

## 2013-10-03 NOTE — Telephone Encounter (Signed)
[name] had forms on her desk and handed them to me I put forms on Dr. Kelby Fam desk for him to sign for patient assistance for Dexilant I called patient back and explained we are waiting on Dr. Deatra Ina to sign the forms then they will be faxed over I advised patient her office contact will be [name], CMA because she works directly with Dr. Deatra Ina I advised patient if she runs out of samples before to call and see if we have any in the office Patient verbalized understanding

## 2013-10-05 NOTE — Telephone Encounter (Signed)
Called pt to inform that forms was signed today and will be faxed today

## 2013-10-07 ENCOUNTER — Telehealth: Payer: Self-pay | Admitting: Endocrinology

## 2013-10-07 ENCOUNTER — Other Ambulatory Visit: Payer: Self-pay | Admitting: *Deleted

## 2013-10-07 MED ORDER — METFORMIN HCL 500 MG PO TABS: 500.0000 mg | ORAL_TABLET | Freq: Two times a day (BID) | ORAL | Status: DC

## 2013-10-07 NOTE — Telephone Encounter (Signed)
Please call in metfomin to [facility]

## 2013-10-07 NOTE — Telephone Encounter (Signed)
rx sent

## 2013-11-04 ENCOUNTER — Other Ambulatory Visit: Payer: Self-pay | Admitting: Nurse Practitioner

## 2013-12-09 ENCOUNTER — Other Ambulatory Visit: Payer: Self-pay | Admitting: Nurse Practitioner

## 2013-12-09 ENCOUNTER — Telehealth: Payer: Self-pay | Admitting: Internal Medicine

## 2013-12-09 NOTE — Telephone Encounter (Signed)
Rec'd from [facility] forward 2 pages to Dr.John °

## 2013-12-14 ENCOUNTER — Other Ambulatory Visit: Payer: Self-pay | Admitting: Gastroenterology

## 2013-12-15 NOTE — Telephone Encounter (Signed)
Refill for Bentyl requested by patient. Do you want to refill?

## 2013-12-30 ENCOUNTER — Telehealth: Payer: Self-pay | Admitting: Endocrinology

## 2013-12-30 ENCOUNTER — Other Ambulatory Visit: Payer: Self-pay | Admitting: Gynecology

## 2013-12-30 ENCOUNTER — Other Ambulatory Visit: Payer: Self-pay | Admitting: *Deleted

## 2013-12-30 DIAGNOSIS — Z1231 Encounter for screening mammogram for malignant neoplasm of breast: Secondary | ICD-10-CM

## 2013-12-30 MED ORDER — PRAVASTATIN SODIUM 20 MG PO TABS: 20.0000 mg | ORAL_TABLET | Freq: Every day | ORAL | Status: DC

## 2013-12-30 MED ORDER — METFORMIN HCL 500 MG PO TABS: 500.0000 mg | ORAL_TABLET | Freq: Two times a day (BID) | ORAL | Status: DC

## 2013-12-30 NOTE — Telephone Encounter (Signed)
ptneeds [place] to call in metformin and pravastatin

## 2013-12-30 NOTE — Telephone Encounter (Signed)
rx sent

## 2014-01-02 NOTE — Telephone Encounter (Signed)
Please resend these rx to [facility] as previously requested

## 2014-01-06 ENCOUNTER — Other Ambulatory Visit: Payer: Self-pay | Admitting: *Deleted

## 2014-01-06 MED ORDER — PRAVASTATIN SODIUM 20 MG PO TABS: 20.0000 mg | ORAL_TABLET | Freq: Every day | ORAL | Status: DC

## 2014-01-06 MED ORDER — METFORMIN HCL 500 MG PO TABS: 500.0000 mg | ORAL_TABLET | Freq: Two times a day (BID) | ORAL | Status: DC

## 2014-01-06 NOTE — Telephone Encounter (Signed)
Patient called about her prescription pravastian, she said it go to mail order with [facility].

## 2014-01-06 NOTE — Telephone Encounter (Signed)
rx's sent to [facility].

## 2014-01-19 ENCOUNTER — Telehealth: Payer: Self-pay | Admitting: Internal Medicine

## 2014-01-19 NOTE — Telephone Encounter (Signed)
[facility]  Appt date is 8/13.

## 2014-01-19 NOTE — Telephone Encounter (Signed)
Patient is requesting a [facility] referral to [facility] for a mammogram.  Appointment date is 8/13.

## 2014-01-26 ENCOUNTER — Ambulatory Visit (HOSPITAL_COMMUNITY)
Admission: RE | Admit: 2014-01-26 | Discharge: 2014-01-26 | Disposition: A | Discharge status: Home / Self Care | Payer: Medicare PPO | Source: Ambulatory Visit | Attending: Gynecology | Admitting: Gynecology

## 2014-01-26 ENCOUNTER — Other Ambulatory Visit: Payer: Self-pay | Admitting: Nurse Practitioner

## 2014-01-26 DIAGNOSIS — Z1231 Encounter for screening mammogram for malignant neoplasm of breast: Secondary | ICD-10-CM | POA: Diagnosis not present

## 2014-01-27 ENCOUNTER — Other Ambulatory Visit: Payer: Self-pay | Admitting: *Deleted

## 2014-01-27 ENCOUNTER — Telehealth: Payer: Self-pay | Admitting: Endocrinology

## 2014-01-27 MED ORDER — TRIAMTERENE-HCTZ 37.5-25 MG PO CAPS: 1.0000 | ORAL_CAPSULE | ORAL | Status: DC

## 2014-01-27 NOTE — Telephone Encounter (Signed)
Please call in the tram to [facility] please thank you ASAP

## 2014-01-27 NOTE — Telephone Encounter (Signed)
Patient need refill on med Triameterne 37.5 - 25

## 2014-01-27 NOTE — Telephone Encounter (Signed)
rx resent to [facility]

## 2014-01-30 LAB — HM MAMMOGRAPHY

## 2014-02-06 ENCOUNTER — Telehealth: Payer: Self-pay | Admitting: Internal Medicine

## 2014-02-06 NOTE — Telephone Encounter (Signed)
Pt states [facility] sent an office note in June about her having a renal cyst on her kidney.  Pt also states the MRI discovering that was done 05/2013.  She would like to know if Dr. Jenny Reichmann is going to f/u with her about the finding??

## 2014-02-07 ENCOUNTER — Telehealth: Payer: Self-pay | Admitting: Internal Medicine

## 2014-02-07 DIAGNOSIS — N281 Cyst of kidney, acquired: Secondary | ICD-10-CM

## 2014-02-07 NOTE — Telephone Encounter (Signed)
Received note on MRI with mention of possible renal cyst  OK for abd u/s - will order

## 2014-02-07 NOTE — Telephone Encounter (Signed)
Called [facility]. Medical records left a detailed message of PCP request.

## 2014-02-07 NOTE — Telephone Encounter (Signed)
[name] to contact [facility]  - need most recent MRI results from dec 2014 (either from them or the imaging facility) as this was not included information sent to me

## 2014-02-10 ENCOUNTER — Telehealth: Payer: Self-pay | Admitting: Internal Medicine

## 2014-02-10 NOTE — Telephone Encounter (Signed)
Rec'd from [facility] forward 2 pages to [place]

## 2014-02-16 ENCOUNTER — Ambulatory Visit
Admission: RE | Admit: 2014-02-16 | Discharge: 2014-02-16 | Disposition: A | Discharge status: Home / Self Care | Payer: Commercial Managed Care - HMO | Source: Ambulatory Visit | Attending: Internal Medicine | Admitting: Internal Medicine

## 2014-02-16 ENCOUNTER — Other Ambulatory Visit (INDEPENDENT_AMBULATORY_CARE_PROVIDER_SITE_OTHER): Payer: Commercial Managed Care - HMO

## 2014-02-16 DIAGNOSIS — E119 Type 2 diabetes mellitus without complications: Secondary | ICD-10-CM

## 2014-02-16 DIAGNOSIS — N281 Cyst of kidney, acquired: Secondary | ICD-10-CM

## 2014-02-16 LAB — COMPREHENSIVE METABOLIC PANEL
ALT: 23 U/L (ref 0–35)
AST: 28 U/L (ref 0–37)
Albumin: 3.8 g/dL (ref 3.5–5.2)
Alkaline Phosphatase: 78 U/L (ref 39–117)
BUN: 19 mg/dL (ref 6–23)
CO2: 27 mEq/L (ref 19–32)
Calcium: 9.6 mg/dL (ref 8.4–10.5)
Chloride: 104 mEq/L (ref 96–112)
Creatinine, Ser: 1.3 mg/dL — ABNORMAL HIGH (ref 0.4–1.2)
GFR: 53.17 mL/min — ABNORMAL LOW (ref >60.00)
Glucose, Bld: 81 mg/dL (ref 70–99)
Potassium: 4 mEq/L (ref 3.5–5.1)
Sodium: 138 mEq/L (ref 135–145)
Total Bilirubin: 0.5 mg/dL (ref 0.2–1.2)
Total Protein: 8.2 g/dL (ref 6.0–8.3)

## 2014-02-16 LAB — HEMOGLOBIN A1C: Hgb A1c MFr Bld: 6 % (ref 4.6–6.5)

## 2014-02-22 ENCOUNTER — Encounter: Payer: Self-pay | Admitting: Endocrinology

## 2014-02-22 ENCOUNTER — Ambulatory Visit (INDEPENDENT_AMBULATORY_CARE_PROVIDER_SITE_OTHER): Payer: Commercial Managed Care - HMO | Admitting: Endocrinology

## 2014-02-22 VITALS — BP 122/60 | HR 109 | Temp 98.1°F | Resp 16 | Ht 65.0 in | Wt 185.4 lb

## 2014-02-22 DIAGNOSIS — E7800 Pure hypercholesterolemia, unspecified: Secondary | ICD-10-CM

## 2014-02-22 DIAGNOSIS — E119 Type 2 diabetes mellitus without complications: Secondary | ICD-10-CM

## 2014-02-22 DIAGNOSIS — I10 Essential (primary) hypertension: Secondary | ICD-10-CM

## 2014-02-22 MED ORDER — BISOPROLOL-HYDROCHLOROTHIAZIDE 2.5-6.25 MG PO TABS: 1.0000 | ORAL_TABLET | Freq: Every day | ORAL | Status: DC

## 2014-02-22 NOTE — Progress Notes (Signed)
Patient ID: Daisy Dillon, female   DOB: [birth year], 68 y.o.   MRN: [id number]   Reason for Appointment: Diabetes follow-up   History of Present Illness   Diagnosis: Type 2 DIABETES MELITUS, date of diagnosis:   2002    She has had stable control of her diabetes with combination of Actos and metformin She usually has had an upper normal A1c  This is despite her getting some steroid injections in her back lately She does not check her sugar very often and did not bring her monitor She will check some readings after meals also which are usually not high  She has had some fluctuation in her weight but stable now Generally fairly compliant with diet.              Monitors blood glucose: Sporadically.    Glucometer: One touch ultra mini         Blood Glucose readings from diary: Range 89-148    Hypoglycemia: Never.          Meals: 3 meals per day. Rarely fried food          Physical activity: exercise: Minimal because of low back pain           Complications: are: No history of neuropathy     Wt Readings from Last 3 Encounters:  02/22/14 185 lb 6.4 oz (84.097 kg)  08/31/13 186 lb 12.8 oz (84.732 kg)  08/16/13 186 lb 9.6 oz (84.641 kg)    Lab Results  Component Value Date   HGBA1C 6.0 02/16/2014   HGBA1C 5.8 08/01/2013   HGBA1C 5.6 01/21/2013   Lab Results  Component Value Date   MICROALBUR 0.1 08/01/2013   LDLCALC 58 08/01/2013   CREATININE 1.3* 02/16/2014     Appointment on 02/16/2014  Component Date Value Ref Range Status  . Hemoglobin A1C 02/16/2014 6.0  4.6 - 6.5 % Final   Glycemic Control Guidelines for People with Diabetes:Non Diabetic:  <6%Goal of Therapy: <7%Additional Action Suggested:  >8%   . Sodium 02/16/2014 138  135 - 145 mEq/L Final  . Potassium 02/16/2014 4.0  3.5 - 5.1 mEq/L Final  . Chloride 02/16/2014 104  96 - 112 mEq/L Final  . CO2 02/16/2014 27  19 - 32 mEq/L Final  . Glucose, Bld 02/16/2014 81  70 - 99 mg/dL Final  . BUN 02/16/2014 19  6 - 23 mg/dL Final   . Creatinine, Ser 02/16/2014 1.3* 0.4 - 1.2 mg/dL Final  . Total Bilirubin 02/16/2014 0.5  0.2 - 1.2 mg/dL Final  . Alkaline Phosphatase 02/16/2014 78  39 - 117 U/L Final  . AST 02/16/2014 28  0 - 37 U/L Final  . ALT 02/16/2014 23  0 - 35 U/L Final  . Total Protein 02/16/2014 8.2  6.0 - 8.3 g/dL Final  . Albumin 02/16/2014 3.8  3.5 - 5.2 g/dL Final  . Calcium 02/16/2014 9.6  8.4 - 10.5 mg/dL Final  . GFR 02/16/2014 53.17* >60.00 mL/min Final      Medication List       This list is accurate as of: 02/22/14 10:48 AM.  Always use your most recent med list.               acetaminophen 650 MG CR tablet  Commonly known as:  TYLENOL  Take 650 mg by mouth every 8 (eight) hours as needed for pain.     aspirin 81 MG tablet  Take 81 mg by mouth daily.  CALCIUM + D PO  Take by mouth.     DEXILANT 60 MG capsule  Generic drug:  dexlansoprazole  Take 60 mg by mouth daily.     dicyclomine 10 MG capsule  Commonly known as:  BENTYL  TAKE ONE CAPSULE BY MOUTH TWICE DAILY AS NEEDED     EAR WAX REMOVAL DROPS 6.5 % otic solution  Generic drug:  carbamide peroxide     glucosamine-chondroitin 500-400 MG tablet  Take 1 tablet by mouth 3 (three) times daily.     losartan 50 MG tablet  Commonly known as:  COZAAR  Take 1 tablet (50 mg total) by mouth daily.     metFORMIN 500 MG tablet  Commonly known as:  GLUCOPHAGE  Take 1 tablet (500 mg total) by mouth 2 (two) times daily with a meal.     multivitamin tablet  Take 1 tablet by mouth daily.     ONE TOUCH ULTRA TEST test strip  Generic drug:  glucose blood  USE AS DIRECTED EVERY DAY     pioglitazone 15 MG tablet  Commonly known as:  ACTOS  Take 1 tablet (15 mg total) by mouth daily.     pravastatin 20 MG tablet  Commonly known as:  PRAVACHOL  Take 1 tablet (20 mg total) by mouth daily.     triamterene-hydrochlorothiazide 37.5-25 MG per capsule  Commonly known as:  DYAZIDE  Take 1 each (1 capsule total) by mouth every  morning.     verapamil 240 MG 24 hr capsule  Commonly known as:  VERELAN PM  Take 1 capsule (240 mg total) by mouth at bedtime.     Vitamin D3 1000 UNITS Caps  Take 1 capsule by mouth daily.        Allergies:  Allergies  Allergen Reactions  . Codeine Phosphate     REACTION: unspecified, nausea, vomiting    Past Medical History  Diagnosis Date  . Diabetes mellitus   . IBS (irritable bowel syndrome)   . GERD (gastroesophageal reflux disease)   . Elevated cholesterol   . Hiatal hernia   . Gastritis   . Diverticulosis of colon (without mention of hemorrhage)   . Stricture and stenosis of esophagus 09/03/2005  . Hypertension   . Fibroid   . Urinary incontinence   . Positive ANA (antinuclear antibody) 04/23/2012    Past Surgical History  Procedure Laterality Date  . Abdominal hysterectomy  1985  . Cholecystectomy    . Knee surgery Right     arthroscopic  . Colonoscopy  12/15/2007    diverticulosis  . Esophagogastroduodenoscopy  12/24/2012    normal     Family History  Problem Relation Age of Onset  . Diabetes Mother   . Hypertension Mother   . Breast cancer Maternal Aunt     Age 40's  . Stroke Brother     x 2  . Colon cancer Neg Hx   . Stomach cancer Neg Hx   . Throat cancer Neg Hx   . Liver disease Neg Hx     Social History:  reports that she has never smoked. She has never used smokeless tobacco. She reports that she does not drink alcohol or use illicit drugs.  Review of Systems:  Still has back pain, had ? Steroid shots  HYPERTENSION:  monitors blood pressure periodically at home with usually good readings  HYPERLIPIDEMIA: The lipid abnormality consists of elevated LDL which is well-controlled on pravastatin  Lab Results  Component Value Date  CHOL 131 08/01/2013   HDL 51.50 08/01/2013   LDLCALC 58 08/01/2013   TRIG 107.0 08/01/2013   CHOLHDL 3 08/01/2013     No thyroid disease in the past   Lab Results  Component Value Date   TSH 2.07  04/26/2013     Examination:   BP 122/60  Pulse 109  Temp(Src) 98.1 F (36.7 C)  Resp 16  Ht 5\' 5"  (1.651 m)  Wt 185 lb 6.4 oz (84.097 kg)  BMI 30.85 kg/m2  SpO2 98%  Body mass index is 30.85 kg/(m^2).   ASSESSMENT/ PLAN:   Diabetes type 2   The patient's diabetes control appears to be good with upper normal A1c again However she has not been exercising because of low back pain She is on metformin and Actos and can check blood sugars about twice a week She will check with her orthopedic doctor if she can do water exercises  HYPERLIPIDEMIA: Well controlled with LDL at target using  low dose Pravachol  Hypertension: Good readings in the office today, hypokalemia has been controlled with potassium supplements However she has relatively fast heart rate again Also her creatinine is relatively higher Will change her Dyazide to Ziac 2.5 mg daily She will followup with her PCP in 2 months  Gladies Sofranko 02/22/2014, 10:48 AM

## 2014-02-22 NOTE — Patient Instructions (Signed)
Change Dyazide cap to Ziac 2.5 daily

## 2014-03-01 ENCOUNTER — Other Ambulatory Visit: Payer: Self-pay | Admitting: *Deleted

## 2014-03-01 ENCOUNTER — Telehealth: Payer: Self-pay | Admitting: Endocrinology

## 2014-03-01 MED ORDER — VERAPAMIL HCL ER 240 MG PO CP24: 240.0000 mg | ORAL_CAPSULE | Freq: Every day | ORAL | Status: DC

## 2014-03-01 NOTE — Telephone Encounter (Signed)
rx sent

## 2014-03-01 NOTE — Telephone Encounter (Signed)
Patient is requesting refill Verapamil 240, sent to [facility] mail order

## 2014-03-09 ENCOUNTER — Other Ambulatory Visit: Payer: Self-pay | Admitting: Nurse Practitioner

## 2014-03-10 ENCOUNTER — Other Ambulatory Visit: Payer: Self-pay | Admitting: Endocrinology

## 2014-03-23 ENCOUNTER — Telehealth: Payer: Self-pay | Admitting: Endocrinology

## 2014-03-23 ENCOUNTER — Other Ambulatory Visit: Payer: Self-pay | Admitting: *Deleted

## 2014-03-23 NOTE — Telephone Encounter (Signed)
Patient states she needs to speak with Suanne Marker regarding a issue with her rx

## 2014-03-24 ENCOUNTER — Other Ambulatory Visit: Payer: Self-pay | Admitting: *Deleted

## 2014-03-24 MED ORDER — VERAPAMIL HCL ER 240 MG PO CP24: 240.0000 mg | ORAL_CAPSULE | Freq: Every day | ORAL | Status: DC

## 2014-03-24 NOTE — Telephone Encounter (Signed)
Pt needs new rx for the verapimil and the rx is being denied she has not meds as of 3 days

## 2014-04-12 ENCOUNTER — Other Ambulatory Visit: Payer: Self-pay | Admitting: Endocrinology

## 2014-04-14 ENCOUNTER — Encounter: Payer: Medicare Other | Admitting: Gynecology

## 2014-04-14 ENCOUNTER — Other Ambulatory Visit: Payer: Self-pay | Admitting: Endocrinology

## 2014-04-17 ENCOUNTER — Ambulatory Visit (INDEPENDENT_AMBULATORY_CARE_PROVIDER_SITE_OTHER): Payer: Commercial Managed Care - HMO | Admitting: Gynecology

## 2014-04-17 ENCOUNTER — Encounter: Payer: Self-pay | Admitting: Gynecology

## 2014-04-17 VITALS — BP 122/76 | Ht 65.0 in | Wt 194.0 lb

## 2014-04-17 DIAGNOSIS — N393 Stress incontinence (female) (male): Secondary | ICD-10-CM

## 2014-04-17 DIAGNOSIS — N952 Postmenopausal atrophic vaginitis: Secondary | ICD-10-CM

## 2014-04-17 DIAGNOSIS — Z780 Asymptomatic menopausal state: Secondary | ICD-10-CM

## 2014-04-17 DIAGNOSIS — Z23 Encounter for immunization: Secondary | ICD-10-CM

## 2014-04-17 NOTE — Progress Notes (Signed)
Daisy Dillon [birth year] [id number]   History:    68 y.o.  for GYN exam and follow-up. Patient last year had been complaining hot flushes. She had been on Climara 0.025 mg q. Weekly but she states that sometimes the patch falls and also is becoming too expensive. We had prescribed her Elestrin 1.51% transdermal application which she has not started. She stated her vasomotor symptoms and very far in between and she has bouts an over-the-counter product that she takes when necessary.She had had history of vaginal dryness as well the past. She denies any vaginal bleeding. She does have nocturia and has been working on her Kegel exercises for her stress urinary incontinence although she is on a diuretic for hypertension.She had a total Abdominal hysterectomy in 1985 for fibroids. She has never had cervical or vaginal dysplasia. Her last Pap smear was 2011. She is up-to-date on mammograms. She is having no pelvic pain.Patient's last colonoscopy was less than 10 years and she states that she has never had any polyps or any family history of GI malignancy. She states that her shingles and Tdap vaccine are up-to-date and she would like have the flu vaccine today. Patient's last bone density study was in 2014 and was normal.    Past medical history,surgical history, family history and social history were all reviewed and documented in the EPIC chart.  Gynecologic History No LMP recorded. Patient has had a hysterectomy. Contraception: status post hysterectomy Last Pap: 2011. Results were: normal Last mammogram: 2015. Results were: normal  Obstetric History OB History  Gravida Para Term Preterm AB SAB TAB Ectopic Multiple Living  2 2 2       2     # Outcome Date GA Lbr Len/2nd Weight Sex Delivery Anes PTL Lv  2 Term           1 Term                ROS: A ROS was performed and pertinent positives and negatives are included in the history.  GENERAL: No fevers or chills. HEENT: No change in vision,  no earache, sore throat or sinus congestion. NECK: No pain or stiffness. CARDIOVASCULAR: No chest pain or pressure. No palpitations. PULMONARY: No shortness of breath, cough or wheeze. GASTROINTESTINAL: No abdominal pain, nausea, vomiting or diarrhea, melena or bright red blood per rectum. GENITOURINARY: No urinary frequency, urgency, hesitancy or dysuria. MUSCULOSKELETAL: No joint or muscle pain, no back pain, no recent trauma. DERMATOLOGIC: No rash, no itching, no lesions. ENDOCRINE: No polyuria, polydipsia, no heat or cold intolerance. No recent change in weight. HEMATOLOGICAL: No anemia or easy bruising or bleeding. NEUROLOGIC: No headache, seizures, numbness, tingling or weakness. PSYCHIATRIC: No depression, no loss of interest in normal activity or change in sleep pattern.     Exam: chaperone present  BP 122/76 mmHg  Ht 5\' 5"  (1.651 m)  Wt 194 lb (87.998 kg)  BMI 32.28 kg/m2  Body mass index is 32.28 kg/(m^2).  General appearance : Well developed well nourished female. No acute distress HEENT: Neck supple, trachea midline, no carotid bruits, no thyroidmegaly Lungs: Clear to auscultation, no rhonchi or wheezes, or rib retractions  Heart: Regular rate and rhythm, no murmurs or gallops Breast:Examined in sitting and supine position were symmetrical in appearance, no palpable masses or tenderness,  no skin retraction, no nipple inversion, no nipple discharge, no skin discoloration, no axillary or supraclavicular lymphadenopathy Abdomen: no palpable masses or tenderness, no rebound or guarding Extremities: no edema or  skin discoloration or tenderness  Pelvic:  Bartholin, Urethra, Skene Glands: Within normal limits             Vagina: No gross lesions or discharge,atrophic changes  Cervix:absent              Uterus  absent              Adnexa  Without masses or tenderness  Anus and perineum  normal   Rectovaginal  normal sphincter tone without palpated masses or tenderness              Hemoccult PCP provides     Assessment/Plan:  68 y.o. female GYN exam. Patient with minimal stress urinary incontinence we'll continue to do her Kegel exercises. Part of her nocturia is attributed to her diuretic which she's on for hypertension. Dr. Jenny Reichmann is her PCP has been doing her blood work. Patient no longer on HRT. Patient was reminded on the importance of monthly supple examination. Patient will no longer need Pap smears in accordance to the new guidelines. Patient received the flu vaccine today. We discussed importance of calcium and vitamin D and regular exercise for osteoporosis prevention.   Terrance Mass MD, 11:41 AM 04/17/2014

## 2014-04-17 NOTE — Patient Instructions (Signed)
Kegel Exercises The goal of Kegel exercises is to isolate and exercise your pelvic floor muscles. These muscles act as a hammock that supports the rectum, vagina, small intestine, and uterus. As the muscles weaken, the hammock sags and these organs are displaced from their normal positions. Kegel exercises can strengthen your pelvic floor muscles and help you to improve bladder and bowel control, improve sexual response, and help reduce many problems and some discomfort during pregnancy. Kegel exercises can be done anywhere and at any time. HOW TO PERFORM KEGEL EXERCISES 1. Locate your pelvic floor muscles. To do this, squeeze (contract) the muscles that you use when you try to stop the flow of urine. You will feel a tightness in the vaginal area (women) and a tight lift in the rectal area (men and women). 2. When you begin, contract your pelvic muscles tight for 2-5 seconds, then relax them for 2-5 seconds. This is one set. Do 4-5 sets with a short pause in between. 3. Contract your pelvic muscles for 8-10 seconds, then relax them for 8-10 seconds. Do 4-5 sets. If you cannot contract your pelvic muscles for 8-10 seconds, try 5-7 seconds and work your way up to 8-10 seconds. Your goal is 4-5 sets of 10 contractions each day. Keep your stomach, buttocks, and legs relaxed during the exercises. Perform sets of both short and long contractions. Vary your positions. Perform these contractions 3-4 times per day. Perform sets while you are:   Lying in bed in the morning.  Standing at lunch.  Sitting in the late afternoon.  Lying in bed at night. You should do 40-50 contractions per day. Do not perform more Kegel exercises per day than recommended. Overexercising can cause muscle fatigue. Continue these exercises for for at least 15-20 weeks or as directed by your caregiver. Document Released: 05/19/2012 Document Reviewed: 05/19/2012 [facility] Patient Information 2015 [place]. This information is  not intended to replace advice given to you by your health care provider. Make sure you discuss any questions you have with your health care provider. Influenza Virus Vaccine injection (Fluarix) What is this medicine? INFLUENZA VIRUS VACCINE (in floo EN zuh VAHY ruhs vak SEEN) helps to reduce the risk of getting influenza also known as the flu. This medicine may be used for other purposes; ask your health care provider or pharmacist if you have questions. COMMON BRAND NAME(S): Fluarix, Fluzone What should I tell my health care provider before I take this medicine? They need to know if you have any of these conditions: -bleeding disorder like hemophilia -fever or infection -Guillain-Barre syndrome or other neurological problems -immune system problems -infection with the human immunodeficiency virus (HIV) or AIDS -low blood platelet counts -multiple sclerosis -an unusual or allergic reaction to influenza virus vaccine, eggs, chicken proteins, latex, gentamicin, other medicines, foods, dyes or preservatives -pregnant or trying to get pregnant -breast-feeding How should I use this medicine? This vaccine is for injection into a muscle. It is given by a health care professional. A copy of Vaccine Information Statements will be given before each vaccination. Read this sheet carefully each time. The sheet may change frequently. Talk to your pediatrician regarding the use of this medicine in children. Special care may be needed. Overdosage: If you think you have taken too much of this medicine contact a poison control center or emergency room at once. NOTE: This medicine is only for you. Do not share this medicine with others. What if I miss a dose? This does not apply.  What may interact with this medicine? -chemotherapy or radiation therapy -medicines that lower your immune system like etanercept, anakinra, infliximab, and adalimumab -medicines that treat or prevent blood clots like  warfarin -phenytoin -steroid medicines like prednisone or cortisone -theophylline -vaccines This list may not describe all possible interactions. Give your health care provider a list of all the medicines, herbs, non-prescription drugs, or dietary supplements you use. Also tell them if you smoke, drink alcohol, or use illegal drugs. Some items may interact with your medicine. What should I watch for while using this medicine? Report any side effects that do not go away within 3 days to your doctor or health care professional. Call your health care provider if any unusual symptoms occur within 6 weeks of receiving this vaccine. You may still catch the flu, but the illness is not usually as bad. You cannot get the flu from the vaccine. The vaccine will not protect against colds or other illnesses that may cause fever. The vaccine is needed every year. What side effects may I notice from receiving this medicine? Side effects that you should report to your doctor or health care professional as soon as possible: -allergic reactions like skin rash, itching or hives, swelling of the face, lips, or tongue Side effects that usually do not require medical attention (report to your doctor or health care professional if they continue or are bothersome): -fever -headache -muscle aches and pains -pain, tenderness, redness, or swelling at site where injected -weak or tired This list may not describe all possible side effects. Call your doctor for medical advice about side effects. You may report side effects to FDA at 1-800-FDA-1088. Where should I keep my medicine? This vaccine is only given in a clinic, pharmacy, doctor's office, or other health care setting and will not be stored at home. NOTE: This sheet is a summary. It may not cover all possible information. If you have questions about this medicine, talk to your doctor, pharmacist, or health care provider.  2015, [employer]. (2007-12-29  09:30:40)

## 2014-04-18 ENCOUNTER — Other Ambulatory Visit: Payer: Self-pay | Admitting: Nurse Practitioner

## 2014-04-18 NOTE — Telephone Encounter (Signed)
Nevin Bloodgood,   Pharmacy sent refill request for Bentyl  Do you want to refill

## 2014-04-28 ENCOUNTER — Telehealth: Payer: Self-pay | Admitting: *Deleted

## 2014-04-28 NOTE — Telephone Encounter (Signed)
Patient said at her last visit, you put her on Ziac, she said since then she's been urinating at least every 30 minutes constantly, last week her ankles were swollen for 2 days, but have since went down and she hasn't had a problem since then.  Please advise.

## 2014-04-28 NOTE — Telephone Encounter (Signed)
Discussed with patient: She was started on Ziac instead of Dyazide because of hypokalemia in September.  She has had thefrequent urination without dysuria only recently and reassured her that this is not related to the medication change She will discuss with PCP next week

## 2014-05-02 ENCOUNTER — Ambulatory Visit (INDEPENDENT_AMBULATORY_CARE_PROVIDER_SITE_OTHER): Payer: Commercial Managed Care - HMO | Admitting: Internal Medicine

## 2014-05-02 ENCOUNTER — Other Ambulatory Visit (INDEPENDENT_AMBULATORY_CARE_PROVIDER_SITE_OTHER): Payer: Commercial Managed Care - HMO

## 2014-05-02 ENCOUNTER — Encounter: Payer: Self-pay | Admitting: Internal Medicine

## 2014-05-02 VITALS — BP 122/72 | HR 80 | Temp 98.2°F | Ht 65.0 in | Wt 189.2 lb

## 2014-05-02 DIAGNOSIS — Z0000 Encounter for general adult medical examination without abnormal findings: Secondary | ICD-10-CM

## 2014-05-02 DIAGNOSIS — E119 Type 2 diabetes mellitus without complications: Secondary | ICD-10-CM

## 2014-05-02 LAB — LIPID PANEL
Cholesterol: 145 mg/dL (ref 0–200)
HDL: 64.7 mg/dL (ref >39.00)
LDL Cholesterol: 57 mg/dL (ref 0–99)
NonHDL: 80.3
Total CHOL/HDL Ratio: 2
Triglycerides: 115 mg/dL (ref 0.0–149.0)
VLDL: 23 mg/dL (ref 0.0–40.0)

## 2014-05-02 LAB — HEPATIC FUNCTION PANEL
ALT: 30 U/L (ref 0–35)
AST: 36 U/L (ref 0–37)
Albumin: 4.5 g/dL (ref 3.5–5.2)
Alkaline Phosphatase: 90 U/L (ref 39–117)
Bilirubin, Direct: 0.1 mg/dL (ref 0.0–0.3)
Total Bilirubin: 0.4 mg/dL (ref 0.2–1.2)
Total Protein: 8.7 g/dL — ABNORMAL HIGH (ref 6.0–8.3)

## 2014-05-02 LAB — URINALYSIS, ROUTINE W REFLEX MICROSCOPIC
Bilirubin Urine: NEGATIVE
Hgb urine dipstick: NEGATIVE
Ketones, ur: NEGATIVE
Leukocytes, UA: NEGATIVE
Nitrite: NEGATIVE
RBC / HPF: NONE SEEN (ref 0–?)
Specific Gravity, Urine: 1.02 (ref 1.000–1.030)
Total Protein, Urine: NEGATIVE
Urine Glucose: NEGATIVE
Urobilinogen, UA: 0.2 (ref 0.0–1.0)
pH: 5.5 (ref 5.0–8.0)

## 2014-05-02 LAB — BASIC METABOLIC PANEL
BUN: 17 mg/dL (ref 6–23)
CO2: 20 mEq/L (ref 19–32)
Calcium: 10.1 mg/dL (ref 8.4–10.5)
Chloride: 113 mEq/L — ABNORMAL HIGH (ref 96–112)
Creatinine, Ser: 1.3 mg/dL — ABNORMAL HIGH (ref 0.4–1.2)
GFR: 54.61 mL/min — ABNORMAL LOW (ref >60.00)
Glucose, Bld: 100 mg/dL — ABNORMAL HIGH (ref 70–99)
Potassium: 4.3 mEq/L (ref 3.5–5.1)
Sodium: 145 mEq/L (ref 135–145)

## 2014-05-02 LAB — TSH: TSH: 2.06 u[IU]/mL (ref 0.35–4.50)

## 2014-05-02 NOTE — Progress Notes (Signed)
Subjective:    Patient ID: Daisy Dillon, female    DOB: Oct 01, 1945, 68 y.o.   MRN: [id number]  HPI  Here for wellness and f/u;  Overall doing ok;  Pt denies CP, worsening SOB, DOE, wheezing, orthopnea, PND, worsening LE edema, palpitations, dizziness or syncope, except for 2 wks noticed nonpainful right just able the ankle swelling. Dyazide was changed to ziac in sept due to low K.  Pt denies neurological change such as new headache, facial or extremity weakness.  Pt denies polydipsia, polyuria, or low sugar symptoms. Pt states overall good compliance with treatment and medications, good tolerability, and has been trying to follow lower cholesterol diet.  Pt denies worsening depressive symptoms, suicidal ideation or panic. No fever, night sweats, wt loss, loss of appetite, or other constitutional symptoms.  Pt states good ability with ADL's, has low fall risk, home safety reviewed and adequate, no other significant changes in hearing or vision, and only occasionally active with exercise. No current complaints. Pt continues to have recurring LBP without change in severity, bowel or bladder change, fever, wt loss,  worsening LE pain/numbness/weakness, gait change or falls, has been puttting ofr ct myelogarm and possible surgury, is s/p ESI x 3.  Sees Dr Dwyane Dee for DM, well controlled. Past Medical History  Diagnosis Date  . Diabetes mellitus   . IBS (irritable bowel syndrome)   . GERD (gastroesophageal reflux disease)   . Elevated cholesterol   . Hiatal hernia   . Gastritis   . Diverticulosis of colon (without mention of hemorrhage)   . Stricture and stenosis of esophagus 09/03/2005  . Hypertension   . Fibroid   . Urinary incontinence   . Positive ANA (antinuclear antibody) 04/23/2012   Past Surgical History  Procedure Laterality Date  . Abdominal hysterectomy  1985  . Cholecystectomy    . Knee surgery Right     arthroscopic  . Colonoscopy  12/15/2007    diverticulosis  .  Esophagogastroduodenoscopy  12/24/2012    normal     reports that she has never smoked. She has never used smokeless tobacco. She reports that she does not drink alcohol or use illicit drugs. family history includes Breast cancer in her maternal aunt; Diabetes in her mother; Hypertension in her mother; Stroke in her brother. There is no history of Colon cancer, Stomach cancer, Throat cancer, or Liver disease. Allergies  Allergen Reactions  . Codeine Phosphate     REACTION: unspecified, nausea, vomiting   Current Outpatient Prescriptions on File Prior to Visit  Medication Sig Dispense Refill  . acetaminophen (TYLENOL) 650 MG CR tablet Take 650 mg by mouth every 8 (eight) hours as needed for pain.    Marland Kitchen aspirin 81 MG tablet Take 81 mg by mouth daily.      . bisoprolol-hydrochlorothiazide (ZIAC) 2.5-6.25 MG per tablet Take 1 tablet by mouth daily. 30 tablet 2  . Calcium Carbonate-Vitamin D (CALCIUM + D PO) Take by mouth.      . Cholecalciferol (VITAMIN D3) 1000 UNITS CAPS Take 1 capsule by mouth daily.      Marland Kitchen dexlansoprazole (DEXILANT) 60 MG capsule Take 60 mg by mouth daily.    Marland Kitchen dicyclomine (BENTYL) 10 MG capsule TAKE ONE CAPSULE BY MOUTH TWICE DAILY AS NEEDED 60 capsule 0  . EAR WAX REMOVAL DROPS 6.5 % otic solution     . glucosamine-chondroitin 500-400 MG tablet Take 1 tablet by mouth 3 (three) times daily.    Marland Kitchen losartan (COZAAR) 50 MG tablet Take  1 tablet (50 mg total) by mouth daily. 90 tablet 3  . metFORMIN (GLUCOPHAGE) 500 MG tablet Take 1 tablet (500 mg total) by mouth 2 (two) times daily with a meal. 180 tablet 1  . Multiple Vitamin (MULTIVITAMIN) tablet Take 1 tablet by mouth daily.      . ONE TOUCH ULTRA TEST test strip USE AS DIRECTED EVERY DAY 50 each 3  . pioglitazone (ACTOS) 15 MG tablet TAKE ONE TABLET BY MOUTH ONCE DAILY 30 tablet 3  . pravastatin (PRAVACHOL) 20 MG tablet Take 1 tablet (20 mg total) by mouth daily. 90 tablet 1  . verapamil (VERELAN PM) 240 MG 24 hr capsule  Take 1 capsule (240 mg total) by mouth at bedtime. 90 capsule 1   No current facility-administered medications on file prior to visit.    Review of Systems  Constitutional: Negative for increased diaphoresis, other activity, appetite or other siginficant weight change  HENT: Negative for worsening hearing loss, ear pain, facial swelling, mouth sores and neck stiffness.   Eyes: Negative for other worsening pain, redness or visual disturbance.  Respiratory: Negative for shortness of breath and wheezing.   Cardiovascular: Negative for chest pain and palpitations.  Gastrointestinal: Negative for diarrhea, blood in stool, abdominal distention or other pain Genitourinary: Negative for hematuria, flank pain or change in urine volume.  Musculoskeletal: Negative for myalgias or other joint complaints.  Skin: Negative for color change and wound.  Neurological: Negative for syncope and numbness. other than noted Hematological: Negative for adenopathy. or other swelling Psychiatric/Behavioral: Negative for hallucinations, self-injury, decreased concentration or other worsening agitation.      Objective:   Physical Exam BP 122/72 mmHg  Pulse 80  Temp(Src) 98.2 F (36.8 C) (Oral)  Ht 5\' 5"  (1.651 m)  Wt 189 lb 4 oz (85.843 kg)  BMI 31.49 kg/m2  SpO2 95% VS noted,  Constitutional: Pt is oriented to person, place, and time. Appears well-developed and well-nourished.  Head: Normocephalic and atraumatic.  Right Ear: External ear normal.  Left Ear: External ear normal.  Nose: Nose normal.  Mouth/Throat: Oropharynx is clear and moist.  Eyes: Conjunctivae and EOM are normal. Pupils are equal, round, and reactive to light.  Neck: Normal range of motion. Neck supple. No JVD present. No tracheal deviation present.  Cardiovascular: Normal rate, regular rhythm, normal heart sounds and intact distal pulses.   Pulmonary/Chest: Effort normal and breath sounds without rales or wheezing  Abdominal: Soft.  Bowel sounds are normal. NT. No HSM  Musculoskeletal: Normal range of motion. Exhibits trace to 1+ edema ankles right > left  Lymphadenopathy:  Has no cervical adenopathy.  Neurological: Pt is alert and oriented to person, place, and time. Pt has normal reflexes. No cranial nerve deficit. Motor grossly intact Skin: Skin is warm and dry. No rash noted.  Psychiatric:  Has normal mood and affect. Behavior is normal.      Assessment & Plan:

## 2014-05-02 NOTE — Assessment & Plan Note (Signed)

## 2014-05-02 NOTE — Patient Instructions (Signed)
Please continue all other medications as before, and refills have been done if requested.  Please have the pharmacy call with any other refills you may need.  Please continue your efforts at being more active, low cholesterol diet, and weight control.  You are otherwise up to date with prevention measures today.  Please keep your appointments with your specialists as you may have planned  Please go to the LAB in the Basement (turn left off the elevator) for the tests to be done today  You will be contacted by phone if any changes need to be made immediately.  Otherwise, you will receive a letter about your results with an explanation, but please check with MyChart first  Please return in 1 year for your yearly visit, or sooner if needed, with Lab testing done 3-5 days before  

## 2014-05-02 NOTE — Progress Notes (Signed)
Pre visit review using our clinic review tool, if applicable. No additional management support is needed unless otherwise documented below in the visit note. 

## 2014-05-19 ENCOUNTER — Other Ambulatory Visit (INDEPENDENT_AMBULATORY_CARE_PROVIDER_SITE_OTHER): Payer: Commercial Managed Care - HMO

## 2014-05-19 DIAGNOSIS — E7800 Pure hypercholesterolemia, unspecified: Secondary | ICD-10-CM

## 2014-05-19 DIAGNOSIS — E119 Type 2 diabetes mellitus without complications: Secondary | ICD-10-CM

## 2014-05-21 LAB — LIPID PANEL
Cholesterol: 128 mg/dL (ref 0–200)
HDL: 55.8 mg/dL (ref >39.00)
LDL Cholesterol: 57 mg/dL (ref 0–99)
NonHDL: 72.2
Total CHOL/HDL Ratio: 2
Triglycerides: 76 mg/dL (ref 0.0–149.0)
VLDL: 15.2 mg/dL (ref 0.0–40.0)

## 2014-05-21 LAB — MICROALBUMIN / CREATININE URINE RATIO
Creatinine,U: 186.5 mg/dL
Microalb Creat Ratio: 0.5 mg/g (ref 0.0–30.0)
Microalb, Ur: 1 mg/dL (ref 0.0–1.9)

## 2014-05-21 LAB — BASIC METABOLIC PANEL
BUN: 21 mg/dL (ref 6–23)
CO2: 24 mEq/L (ref 19–32)
Calcium: 9.4 mg/dL (ref 8.4–10.5)
Chloride: 109 mEq/L (ref 96–112)
Creatinine, Ser: 1.3 mg/dL — ABNORMAL HIGH (ref 0.4–1.2)
GFR: 53.61 mL/min — ABNORMAL LOW (ref >60.00)
Glucose, Bld: 78 mg/dL (ref 70–99)
Potassium: 3.7 mEq/L (ref 3.5–5.1)
Sodium: 142 mEq/L (ref 135–145)

## 2014-05-22 ENCOUNTER — Other Ambulatory Visit (INDEPENDENT_AMBULATORY_CARE_PROVIDER_SITE_OTHER): Payer: Commercial Managed Care - HMO

## 2014-05-22 DIAGNOSIS — E119 Type 2 diabetes mellitus without complications: Secondary | ICD-10-CM

## 2014-05-22 LAB — HEMOGLOBIN A1C: Hgb A1c MFr Bld: 5.9 % (ref 4.6–6.5)

## 2014-05-24 ENCOUNTER — Ambulatory Visit (INDEPENDENT_AMBULATORY_CARE_PROVIDER_SITE_OTHER): Payer: Commercial Managed Care - HMO | Admitting: Endocrinology

## 2014-05-24 ENCOUNTER — Encounter: Payer: Self-pay | Admitting: Endocrinology

## 2014-05-24 VITALS — BP 136/74 | HR 86 | Temp 98.3°F | Resp 14 | Ht 65.0 in | Wt 189.8 lb

## 2014-05-24 DIAGNOSIS — I10 Essential (primary) hypertension: Secondary | ICD-10-CM

## 2014-05-24 DIAGNOSIS — E7800 Pure hypercholesterolemia, unspecified: Secondary | ICD-10-CM

## 2014-05-24 DIAGNOSIS — E119 Type 2 diabetes mellitus without complications: Secondary | ICD-10-CM

## 2014-05-24 NOTE — Progress Notes (Signed)
Patient ID: Daisy Dillon, female   DOB: [birth year], 68 y.o.   MRN: [id number]   Reason for Appointment: Diabetes follow-up   History of Present Illness   Diagnosis: Type 2 DIABETES MELITUS, date of diagnosis:   2002    She has had stable control of her diabetes with combination of low-dose Actos and metformin She usually has had an upper normal A1c  She has checked her sugar little more often and most of her readings are near normal This is despite her not being able to do much exercise She is generally particular about her diet and has maintained her weight  Monitors blood glucose: 0.4 x per day .    Glucometer: One touch ultra mini         Blood Glucose readings from diary: Range 89-147 at various times, usually around lunch or supper including after meals  Hypoglycemia: Never.          Meals: 3 meals per day. Rarely fried food          Physical activity: exercise: Minimal            Complications: are: No history of neuropathy     Wt Readings from Last 3 Encounters:  05/24/14 189 lb 12.8 oz (86.093 kg)  05/02/14 189 lb 4 oz (85.843 kg)  04/17/14 194 lb (87.998 kg)    Lab Results  Component Value Date   HGBA1C 5.9 05/22/2014   HGBA1C 6.0 02/16/2014   HGBA1C 5.8 08/01/2013   Lab Results  Component Value Date   MICROALBUR 1.0 05/19/2014   LDLCALC 57 05/19/2014   CREATININE 1.3* 05/19/2014     Lab on 05/22/2014  Component Date Value Ref Range Status  . Hgb A1c MFr Bld 05/22/2014 5.9  4.6 - 6.5 % Final   Glycemic Control Guidelines for People with Diabetes:Non Diabetic:  <6%Goal of Therapy: <7%Additional Action Suggested:  >8%   Appointment on 05/19/2014  Component Date Value Ref Range Status  . Microalb, Ur 05/19/2014 1.0  0.0 - 1.9 mg/dL Final  . Creatinine,U 05/19/2014 186.5   Final  . Microalb Creat Ratio 05/19/2014 0.5  0.0 - 30.0 mg/g Final  . Sodium 05/19/2014 142  135 - 145 mEq/L Final  . Potassium 05/19/2014 3.7  3.5 - 5.1 mEq/L Final  . Chloride 05/19/2014  109  96 - 112 mEq/L Final  . CO2 05/19/2014 24  19 - 32 mEq/L Final  . Glucose, Bld 05/19/2014 78  70 - 99 mg/dL Final  . BUN 05/19/2014 21  6 - 23 mg/dL Final  . Creatinine, Ser 05/19/2014 1.3* 0.4 - 1.2 mg/dL Final  . Calcium 05/19/2014 9.4  8.4 - 10.5 mg/dL Final  . GFR 05/19/2014 53.61* >60.00 mL/min Final  . Cholesterol 05/19/2014 128  0 - 200 mg/dL Final   ATP III Classification       Desirable:  < 200 mg/dL               Borderline High:  200 - 239 mg/dL          High:  > = 240 mg/dL  . Triglycerides 05/19/2014 76.0  0.0 - 149.0 mg/dL Final   Normal:  <150 mg/dLBorderline High:  150 - 199 mg/dL  . HDL 05/19/2014 55.80  >39.00 mg/dL Final  . VLDL 05/19/2014 15.2  0.0 - 40.0 mg/dL Final  . LDL Cholesterol 05/19/2014 57  0 - 99 mg/dL Final  . Total CHOL/HDL Ratio 05/19/2014 2   Final  Men          Women1/2 Average Risk     3.4          3.3Average Risk          5.0          4.42X Average Risk          9.6          7.13X Average Risk          15.0          11.0                      . NonHDL 05/19/2014 72.20   Final   NOTE:  Non-HDL goal should be 30 mg/dL higher than patient's LDL goal (i.e. LDL goal of < 70 mg/dL, would have non-HDL goal of < 100 mg/dL)      Medication List       This list is accurate as of: 05/24/14 11:00 AM.  Always use your most recent med list.               acetaminophen 650 MG CR tablet  Commonly known as:  TYLENOL  Take 650 mg by mouth every 8 (eight) hours as needed for pain.     aspirin 81 MG tablet  Take 81 mg by mouth daily.     bisoprolol-hydrochlorothiazide 2.5-6.25 MG per tablet  Commonly known as:  ZIAC  Take 1 tablet by mouth daily.     CALCIUM + D PO  Take by mouth.     DEXILANT 60 MG capsule  Generic drug:  dexlansoprazole  Take 60 mg by mouth daily.     dicyclomine 10 MG capsule  Commonly known as:  BENTYL  TAKE ONE CAPSULE BY MOUTH TWICE DAILY AS NEEDED     EAR WAX REMOVAL DROPS 6.5 % otic solution  Generic  drug:  carbamide peroxide     glucosamine-chondroitin 500-400 MG tablet  Take 1 tablet by mouth 3 (three) times daily.     losartan 50 MG tablet  Commonly known as:  COZAAR  Take 1 tablet (50 mg total) by mouth daily.     metFORMIN 500 MG tablet  Commonly known as:  GLUCOPHAGE  Take 1 tablet (500 mg total) by mouth 2 (two) times daily with a meal.     multivitamin tablet  Take 1 tablet by mouth daily.     ONE TOUCH ULTRA TEST test strip  Generic drug:  glucose blood  USE AS DIRECTED EVERY DAY     pioglitazone 15 MG tablet  Commonly known as:  ACTOS  TAKE ONE TABLET BY MOUTH ONCE DAILY     pravastatin 20 MG tablet  Commonly known as:  PRAVACHOL  Take 1 tablet (20 mg total) by mouth daily.     verapamil 240 MG 24 hr capsule  Commonly known as:  VERELAN PM  Take 1 capsule (240 mg total) by mouth at bedtime.     Vitamin D3 1000 UNITS Caps  Take 1 capsule by mouth daily.        Allergies:  Allergies  Allergen Reactions  . Codeine Phosphate     REACTION: unspecified, nausea, vomiting    Past Medical History  Diagnosis Date  . Diabetes mellitus   . IBS (irritable bowel syndrome)   . GERD (gastroesophageal reflux disease)   . Elevated cholesterol   . Hiatal hernia   . Gastritis   . Diverticulosis of  colon (without mention of hemorrhage)   . Stricture and stenosis of esophagus 09/03/2005  . Hypertension   . Fibroid   . Urinary incontinence   . Positive ANA (antinuclear antibody) 04/23/2012    Past Surgical History  Procedure Laterality Date  . Abdominal hysterectomy  1985  . Cholecystectomy    . Knee surgery Right     arthroscopic  . Colonoscopy  12/15/2007    diverticulosis  . Esophagogastroduodenoscopy  12/24/2012    normal     Family History  Problem Relation Age of Onset  . Diabetes Mother   . Hypertension Mother   . Breast cancer Maternal Aunt     Age 69's  . Stroke Brother     x 2  . Colon cancer Neg Hx   . Stomach cancer Neg Hx   .  Throat cancer Neg Hx   . Liver disease Neg Hx     Social History:  reports that she has never smoked. She has never used smokeless tobacco. She reports that she does not drink alcohol or use illicit drugs.  Review of Systems:  Still has back pain at times and is unable to do much physical activity  HYPERTENSION:  monitors blood pressure periodically at home with usually good readings: usually in 423N systolic and 36R diastolic Her Dyazide was changed to Ziac because of sinus tachycardia and tendency to hypokalemia  HYPERLIPIDEMIA: The lipid abnormality consists of elevated LDL which is well-controlled on pravastatin  Lab Results  Component Value Date   CHOL 128 05/19/2014   HDL 55.80 05/19/2014   LDLCALC 57 05/19/2014   TRIG 76.0 05/19/2014   CHOLHDL 2 05/19/2014     No thyroid disease by history   Lab Results  Component Value Date   TSH 2.06 05/02/2014     Examination:   BP 136/74 mmHg  Pulse 86  Temp(Src) 98.3 F (36.8 C)  Resp 14  Ht 5\' 5"  (1.651 m)  Wt 189 lb 12.8 oz (86.093 kg)  BMI 31.58 kg/m2  SpO2 98%  Body mass index is 31.58 kg/(m^2).   ASSESSMENT/ PLAN:   Diabetes type 2   The patient's diabetes control appears to be good with  normal A1c again However she has not been exercising because of low back pain She is on metformin and Actos and tolerating these well, has been taking these long-term Again encouraged her to try and start exercise when able to including chair exercises  HYPERLIPIDEMIA: Well controlled with LDL at target using  low dose Pravachol  Hypertension: Good readings with current regimen and she will continue  Follow-up in 5 months   Daisy Dillon 05/24/2014, 11:00 AM

## 2014-05-26 ENCOUNTER — Other Ambulatory Visit: Payer: Self-pay | Admitting: Endocrinology

## 2014-05-29 ENCOUNTER — Telehealth: Payer: Self-pay | Admitting: Internal Medicine

## 2014-05-29 DIAGNOSIS — H538 Other visual disturbances: Secondary | ICD-10-CM

## 2014-05-29 NOTE — Telephone Encounter (Signed)
Pt requesting referral be sent to [facility] for her appt 12/16. She has [employer], can this be generated and sent to them?

## 2014-05-29 NOTE — Telephone Encounter (Signed)
Order done

## 2014-05-30 ENCOUNTER — Other Ambulatory Visit: Payer: Self-pay | Admitting: Nurse Practitioner

## 2014-05-30 NOTE — Telephone Encounter (Signed)
Daisy Dillon,   Patient request refill of Bentyl Is it okay to refill?

## 2014-05-31 ENCOUNTER — Encounter: Payer: Self-pay | Admitting: *Deleted

## 2014-05-31 LAB — HM DIABETES EYE EXAM

## 2014-05-31 NOTE — Telephone Encounter (Signed)
Daisy Dillon, it is okay to refill for her. Thanks

## 2014-06-27 ENCOUNTER — Telehealth: Payer: Self-pay | Admitting: Endocrinology

## 2014-06-27 ENCOUNTER — Telehealth: Payer: Self-pay | Admitting: *Deleted

## 2014-06-27 NOTE — Telephone Encounter (Signed)
Patient would like you to please call her regarding her Rx     Call [phone]   Thank you

## 2014-06-27 NOTE — Telephone Encounter (Signed)
Patient called, she said her bisoprolol-HCTZ has moved to a tier 3, she wants to know if she can go back to the Triamterene. I instructed her to call her insurance company to see what they prefer.  Patient to call back.

## 2014-06-29 NOTE — Telephone Encounter (Signed)
Patient said to disregard, it wasn't as expensive as she thought.  She will stay on the bisoprolol-hctz

## 2014-06-29 NOTE — Telephone Encounter (Signed)
She can go back to triamterene HCT 25 mg, half tablet daily but will need to have potassium checked in about a month

## 2014-07-10 ENCOUNTER — Other Ambulatory Visit: Payer: Self-pay | Admitting: *Deleted

## 2014-07-10 ENCOUNTER — Telehealth: Payer: Self-pay | Admitting: Endocrinology

## 2014-07-10 MED ORDER — LOSARTAN POTASSIUM 50 MG PO TABS: 50.0000 mg | ORAL_TABLET | Freq: Every day | ORAL | Status: DC

## 2014-07-10 MED ORDER — METFORMIN HCL 500 MG PO TABS: 500.0000 mg | ORAL_TABLET | Freq: Two times a day (BID) | ORAL | Status: DC

## 2014-07-10 MED ORDER — PRAVASTATIN SODIUM 20 MG PO TABS: 20.0000 mg | ORAL_TABLET | Freq: Every day | ORAL | Status: DC

## 2014-07-10 NOTE — Telephone Encounter (Signed)
Pt [facility] losartan, metformin, and pravastatin. 90 day supply fax # [phone]

## 2014-07-10 NOTE — Telephone Encounter (Signed)
[facility] per patient request

## 2014-07-13 ENCOUNTER — Other Ambulatory Visit: Payer: Self-pay | Admitting: *Deleted

## 2014-07-13 ENCOUNTER — Telehealth: Payer: Self-pay | Admitting: *Deleted

## 2014-07-13 MED ORDER — VERAPAMIL HCL ER 240 MG PO CP24: 240.0000 mg | ORAL_CAPSULE | Freq: Every day | ORAL | Status: DC

## 2014-07-13 MED ORDER — DICYCLOMINE HCL 10 MG PO CAPS: 10.0000 mg | ORAL_CAPSULE | Freq: Two times a day (BID) | ORAL | Status: DC | PRN

## 2014-07-13 MED ORDER — BISOPROLOL-HYDROCHLOROTHIAZIDE 2.5-6.25 MG PO TABS: 1.0000 | ORAL_TABLET | Freq: Every day | ORAL | Status: DC

## 2014-07-13 NOTE — Telephone Encounter (Signed)
I called the patient to advise I would ask Daisy Savoy NP about refilling this Bentyl 10 mg again. Daisy Dillon last approved it to be refilled on 05-30-2014. Daisy Dillon said it was fine for 1 month. ) The patient said she got new insurance and wants it to go to [facility] because it will be a lot cheaper to do the mail order.  I informed the patient that I faxed the form to [place] today 07-13-2014. Patient verbalized understanding.

## 2014-08-10 ENCOUNTER — Other Ambulatory Visit: Payer: Self-pay | Admitting: Endocrinology

## 2014-08-22 ENCOUNTER — Other Ambulatory Visit: Payer: Self-pay | Admitting: Nurse Practitioner

## 2014-08-27 ENCOUNTER — Other Ambulatory Visit: Payer: Self-pay | Admitting: Nurse Practitioner

## 2014-08-28 ENCOUNTER — Telehealth: Payer: Self-pay | Admitting: Nurse Practitioner

## 2014-08-28 NOTE — Telephone Encounter (Signed)
The patient called asking about the Patient Assistance program for [place] paperwork Dr. Deatra Ina is to be filling out and signing.  I told her it is in Dr. Kelby Fam inbox . He has to fill out his part and sign it. . The patient said she is almost completely out of the [place]. I told her I would mention it to his [place] so we can have him sign this paperwork.  ( Patient assistance program).  I told her she can talk to [name] NP-C Wed the 16th about refilling her Bentyl.

## 2014-08-30 ENCOUNTER — Encounter: Payer: Self-pay | Admitting: Nurse Practitioner

## 2014-08-30 ENCOUNTER — Ambulatory Visit (INDEPENDENT_AMBULATORY_CARE_PROVIDER_SITE_OTHER): Payer: Medicare Other | Admitting: Nurse Practitioner

## 2014-08-30 VITALS — BP 138/80 | HR 76 | Ht 65.0 in | Wt 184.0 lb

## 2014-08-30 DIAGNOSIS — F458 Other somatoform disorders: Secondary | ICD-10-CM | POA: Diagnosis not present

## 2014-08-30 DIAGNOSIS — K219 Gastro-esophageal reflux disease without esophagitis: Secondary | ICD-10-CM | POA: Insufficient documentation

## 2014-08-30 DIAGNOSIS — R09A2 Foreign body sensation, throat: Secondary | ICD-10-CM

## 2014-08-30 DIAGNOSIS — R0989 Other specified symptoms and signs involving the circulatory and respiratory systems: Secondary | ICD-10-CM

## 2014-08-30 DIAGNOSIS — R198 Other specified symptoms and signs involving the digestive system and abdomen: Secondary | ICD-10-CM

## 2014-08-30 MED ORDER — DICYCLOMINE HCL 10 MG PO CAPS: 10.0000 mg | ORAL_CAPSULE | Freq: Two times a day (BID) | ORAL | Status: DC

## 2014-08-30 NOTE — Progress Notes (Signed)
     History of Present Illness:   Patient is a 69 year old female known previously to Dr. Sharlett Iles for chronic GERD, diverticulosis and IBS.  I saw her a year ago for globus sensation refractory to multiple PPIs. Globus sensations is about the same as when I saw her a year ago. No dysphagia. No regurgitation or pyrosis. She is on daily Dexilant, and waiting on refill through the mail. Patient requested Bentyl refill,  told she needed a follow up appointment.     Current Medications, Allergies, Past Medical History, Past Surgical History, Family History and Social History were reviewed in [employer] record.  Physical Exam: General: Pleasant, well developed , black female in no acute distress Head: Normocephalic and atraumatic Eyes:  sclerae anicteric, conjunctiva pink  Ears: Normal auditory acuity Lungs: Clear throughout to auscultation Heart: Regular rate and rhythm Abdomen: Soft, non distended, non-tender. No masses, no hepatomegaly. Normal bowel sounds Musculoskeletal: Symmetrical with no gross deformities  Extremities: No edema  Neurological: Alert oriented x 4, grossly nonfocal Psychological:  Alert and cooperative. Normal mood and affect  Assessment and Recommendations:  57. 69 year old female with chronic GERD / chronic globus sensation / IBS. She is here for a refill on Bentyl. Will refill Bentyl #60 with 5 refills. She will take Prilosec OTC while awaiting Dexilant refill through mail order.  Her globus is about the same, if gets worse consider ENT referral  2. Colon cancer screening. She is on colonoscopy recall list for November 2016 but no polyps on last complete colonoscopy in 2009 and she has no [facility] of colon cancer. Will defer to her primary GI but I don't think she needs one until 2019.

## 2014-08-30 NOTE — Patient Instructions (Signed)
We are sending in your prescription to your pharmacy (Bentyl) Use Prilosec OTC 20mg  twice a day until your Dexilant arrives in the mail

## 2014-08-30 NOTE — Progress Notes (Signed)
Reviewed and agree with management plan. Agree with colonoscopy for average risk CRC screening at a 10 year interval in 12/2017.  Pricilla Riffle. Fuller Plan, MD [facility]

## 2014-09-07 ENCOUNTER — Telehealth: Payer: Self-pay | Admitting: Endocrinology

## 2014-09-07 NOTE — Telephone Encounter (Signed)
Patient stated that she need medications, Verapamil and metformin to hold her until she get her meds in the mail, send to [facility] on [address] please advise

## 2014-09-11 ENCOUNTER — Other Ambulatory Visit: Payer: Self-pay | Admitting: *Deleted

## 2014-09-11 ENCOUNTER — Telehealth: Payer: Self-pay | Admitting: Endocrinology

## 2014-09-11 MED ORDER — METFORMIN HCL 500 MG PO TABS: 500.0000 mg | ORAL_TABLET | Freq: Two times a day (BID) | ORAL | Status: DC

## 2014-09-11 MED ORDER — VERAPAMIL HCL ER 240 MG PO CP24: 240.0000 mg | ORAL_CAPSULE | Freq: Every day | ORAL | Status: DC

## 2014-09-11 NOTE — Telephone Encounter (Signed)
Verapamil was sent to [facility], Metformin to [facility]

## 2014-09-11 NOTE — Telephone Encounter (Signed)
Pt wants verapamin  [facility]ed [facility] and the metformin called into [facility]

## 2014-09-12 ENCOUNTER — Other Ambulatory Visit: Payer: Self-pay | Admitting: Endocrinology

## 2014-09-21 ENCOUNTER — Other Ambulatory Visit: Payer: Self-pay | Admitting: *Deleted

## 2014-09-21 MED ORDER — VERAPAMIL HCL ER 240 MG PO CP24: 240.0000 mg | ORAL_CAPSULE | Freq: Every day | ORAL | Status: DC

## 2014-10-19 ENCOUNTER — Other Ambulatory Visit (INDEPENDENT_AMBULATORY_CARE_PROVIDER_SITE_OTHER): Payer: Medicare Other

## 2014-10-19 DIAGNOSIS — E119 Type 2 diabetes mellitus without complications: Secondary | ICD-10-CM | POA: Diagnosis not present

## 2014-10-19 LAB — BASIC METABOLIC PANEL
BUN: 16 mg/dL (ref 6–23)
CO2: 26 mEq/L (ref 19–32)
Calcium: 9.8 mg/dL (ref 8.4–10.5)
Chloride: 107 mEq/L (ref 96–112)
Creatinine, Ser: 1.14 mg/dL (ref 0.40–1.20)
GFR: 60.65 mL/min (ref >60.00)
Glucose, Bld: 91 mg/dL (ref 70–99)
Potassium: 3.7 mEq/L (ref 3.5–5.1)
Sodium: 140 mEq/L (ref 135–145)

## 2014-10-19 LAB — HEMOGLOBIN A1C: Hgb A1c MFr Bld: 5.6 % (ref 4.6–6.5)

## 2014-10-24 ENCOUNTER — Encounter: Payer: Self-pay | Admitting: Endocrinology

## 2014-10-24 ENCOUNTER — Ambulatory Visit (INDEPENDENT_AMBULATORY_CARE_PROVIDER_SITE_OTHER): Payer: Medicare Other | Admitting: Endocrinology

## 2014-10-24 VITALS — BP 138/80 | HR 90 | Temp 97.7°F | Resp 16 | Ht 65.0 in | Wt 188.2 lb

## 2014-10-24 DIAGNOSIS — I10 Essential (primary) hypertension: Secondary | ICD-10-CM | POA: Diagnosis not present

## 2014-10-24 DIAGNOSIS — E7800 Pure hypercholesterolemia, unspecified: Secondary | ICD-10-CM

## 2014-10-24 DIAGNOSIS — E119 Type 2 diabetes mellitus without complications: Secondary | ICD-10-CM | POA: Diagnosis not present

## 2014-10-24 NOTE — Progress Notes (Signed)
Patient ID: Daisy Dillon, female   DOB: 04/22/1946, 69 y.o.   MRN: [id number]   Reason for Appointment: Diabetes follow-up   History of Present Illness   Diagnosis: Type 2 DIABETES MELITUS, date of diagnosis:   2002    She has had stable control of her mild diabetes with combination of low-dose Actos and metformin Has had no side effects with diabetes. She usually has had a normal A1c  She has checked her sugar periodically but mostly before and after lunch and supper Most readings recently are quite normal without much fluctuation This is despite her not being able to do much exercise She is generally particular about her diet, occasionally may have fried food  Monitors blood glucose: Less than once a day .    Glucometer: One touch ultra mini         Blood Glucose readings from download: Range 83-116, checking mostly before and 2 hours after lunch and supper          Physical activity: exercise: Minimal because of lower back pain           Complications: are: No history of neuropathy     Wt Readings from Last 3 Encounters:  10/24/14 188 lb 3.2 oz (85.367 kg)  08/30/14 184 lb (83.462 kg)  05/24/14 189 lb 12.8 oz (86.093 kg)    Lab Results  Component Value Date   HGBA1C 5.6 10/19/2014   HGBA1C 5.9 05/22/2014   HGBA1C 6.0 02/16/2014   Lab Results  Component Value Date   MICROALBUR 1.0 05/19/2014   LDLCALC 57 05/19/2014   CREATININE 1.14 10/19/2014     Lab on 10/19/2014  Component Date Value Ref Range Status  . Hgb A1c MFr Bld 10/19/2014 5.6  4.6 - 6.5 % Final   Glycemic Control Guidelines for People with Diabetes:Non Diabetic:  <6%Goal of Therapy: <7%Additional Action Suggested:  >8%   . Sodium 10/19/2014 140  135 - 145 mEq/L Final  . Potassium 10/19/2014 3.7  3.5 - 5.1 mEq/L Final  . Chloride 10/19/2014 107  96 - 112 mEq/L Final  . CO2 10/19/2014 26  19 - 32 mEq/L Final  . Glucose, Bld 10/19/2014 91  70 - 99 mg/dL Final  . BUN 10/19/2014 16  6 - 23  mg/dL Final  . Creatinine, Ser 10/19/2014 1.14  0.40 - 1.20 mg/dL Final  . Calcium 10/19/2014 9.8  8.4 - 10.5 mg/dL Final  . GFR 10/19/2014 60.65  >60.00 mL/min Final      Medication List       This list is accurate as of: 10/24/14 10:11 AM.  Always use your most recent med list.               acetaminophen 650 MG CR tablet  Commonly known as:  TYLENOL  Take 650 mg by mouth every 8 (eight) hours as needed for pain.     aspirin 81 MG tablet  Take 81 mg by mouth daily.     bisoprolol-hydrochlorothiazide 2.5-6.25 MG per tablet  Commonly known as:  ZIAC  Take 1 tablet by mouth daily.     CALCIUM + D PO  Take by mouth.     DEXILANT 60 MG capsule  Generic drug:  dexlansoprazole  Take 60 mg by mouth daily.     dicyclomine 10 MG capsule  Commonly known as:  BENTYL  Take 1 capsule (10 mg total) by mouth 2 (two) times daily as needed.     EAR  WAX REMOVAL DROPS 6.5 % otic solution  Generic drug:  carbamide peroxide     glucosamine-chondroitin 500-400 MG tablet  Take 1 tablet by mouth 3 (three) times daily.     losartan 50 MG tablet  Commonly known as:  COZAAR  Take 1 tablet (50 mg total) by mouth daily.     metFORMIN 500 MG tablet  Commonly known as:  GLUCOPHAGE  Take 1 tablet (500 mg total) by mouth 2 (two) times daily with a meal.     multivitamin tablet  Take 1 tablet by mouth daily.     ONE TOUCH ULTRA TEST test strip  Generic drug:  glucose blood  USE AS DIRECTED EVERY DAY     pioglitazone 15 MG tablet  Commonly known as:  ACTOS  TAKE ONE TABLET BY MOUTH ONCE DAILY     pravastatin 20 MG tablet  Commonly known as:  PRAVACHOL  Take 1 tablet (20 mg total) by mouth daily.     verapamil 240 MG 24 hr capsule  Commonly known as:  VERELAN PM  Take 1 capsule (240 mg total) by mouth at bedtime.     Vitamin D3 1000 UNITS Caps  Take 1 capsule by mouth daily.        Allergies:  Allergies  Allergen Reactions  . Codeine Phosphate     REACTION: unspecified,  nausea, vomiting    Past Medical History  Diagnosis Date  . Diabetes mellitus   . IBS (irritable bowel syndrome)   . GERD (gastroesophageal reflux disease)   . Elevated cholesterol   . Hiatal hernia   . Gastritis   . Diverticulosis of colon (without mention of hemorrhage)   . Stricture and stenosis of esophagus 09/03/2005  . Hypertension   . Fibroid   . Urinary incontinence   . Positive ANA (antinuclear antibody) 04/23/2012    Past Surgical History  Procedure Laterality Date  . Abdominal hysterectomy  1985  . Cholecystectomy    . Knee surgery Right     arthroscopic  . Colonoscopy  12/15/2007    diverticulosis  . Esophagogastroduodenoscopy  12/24/2012    normal     Family History  Problem Relation Age of Onset  . Diabetes Mother   . Hypertension Mother   . Breast cancer Maternal Aunt     Age 53's  . Stroke Brother     x 2  . Colon cancer Neg Hx   . Stomach cancer Neg Hx   . Throat cancer Neg Hx   . Liver disease Neg Hx     Social History:  reports that she has never smoked. She has never used smokeless tobacco. She reports that she does not drink alcohol or use illicit drugs.  Review of Systems:  Still has back pain even with standing for long and is unable to do much physical activity, still undecided about possible surgery  HYPERTENSION:  monitors blood pressure periodically at home with a [employer] brand monitor However more recently this has been reading around 503 systolic and her monitor was about 20 mm higher than the office reading  She takes losartan 50 mg Her Dyazide was changed to [place] because of sinus tachycardia and tendency to hypokalemia and blood pressure is also still controlled  Lab Results  Component Value Date   CREATININE 1.14 10/19/2014   BUN 16 10/19/2014   NA 140 10/19/2014   K 3.7 10/19/2014   CL 107 10/19/2014   CO2 26 10/19/2014     HYPERLIPIDEMIA: The  lipid abnormality consists of elevated LDL which is well-controlled on  pravastatin  Lab Results  Component Value Date   CHOL 128 05/19/2014   HDL 55.80 05/19/2014   LDLCALC 57 05/19/2014   TRIG 76.0 05/19/2014   CHOLHDL 2 05/19/2014         Examination:   BP 138/80 mmHg  Pulse 90  Temp(Src) 97.7 F (36.5 C)  Resp 16  Ht 5\' 5"  (1.651 m)  Wt 188 lb 3.2 oz (85.367 kg)  BMI 31.32 kg/m2  SpO2 97%  Body mass index is 31.32 kg/(m^2).   ASSESSMENT/ PLAN:   Diabetes type 2   The patient's diabetes control appears to be excellent with  normal A1c again She has had relatively mild diabetes without any progression over several years She is usually compliant with diet However she has not been exercising because of low back pain She is on metformin and Actos and tolerating these well, has been taking these long-term  HYPERLIPIDEMIA: Well controlled with LDL at target in 12/15 using  low dose Pravachol  Hypertension: Appears well controlled with Ziac.  However she does have a falsely high reading at home and discussed that she needs to get a new monitor  Follow-up in 5 months again   [facility] 10/24/2014, 10:11 AM

## 2014-10-24 NOTE — Patient Instructions (Signed)
SAME meds

## 2014-11-02 ENCOUNTER — Other Ambulatory Visit: Payer: Self-pay | Admitting: *Deleted

## 2014-11-02 MED ORDER — PIOGLITAZONE HCL 15 MG PO TABS: 15.0000 mg | ORAL_TABLET | Freq: Every day | ORAL | Status: DC

## 2014-11-03 ENCOUNTER — Encounter: Payer: Self-pay | Admitting: Gastroenterology

## 2014-11-10 DIAGNOSIS — M5416 Radiculopathy, lumbar region: Secondary | ICD-10-CM | POA: Diagnosis not present

## 2014-11-23 ENCOUNTER — Other Ambulatory Visit: Payer: Self-pay | Admitting: Endocrinology

## 2014-11-23 DIAGNOSIS — M545 Low back pain: Secondary | ICD-10-CM | POA: Diagnosis not present

## 2014-11-27 ENCOUNTER — Other Ambulatory Visit: Payer: Self-pay | Admitting: Endocrinology

## 2014-11-27 ENCOUNTER — Other Ambulatory Visit: Payer: Self-pay | Admitting: Nurse Practitioner

## 2014-11-27 ENCOUNTER — Other Ambulatory Visit: Payer: Self-pay | Admitting: *Deleted

## 2014-11-27 ENCOUNTER — Telehealth: Payer: Self-pay | Admitting: Internal Medicine

## 2014-11-27 MED ORDER — LOSARTAN POTASSIUM 50 MG PO TABS
50.0000 mg | ORAL_TABLET | Freq: Every day | ORAL | Status: DC
Start: 2014-11-27 — End: 2015-04-28

## 2014-11-27 NOTE — Telephone Encounter (Signed)
Received records from [place] forwarded to Dr. Cathlean Cower 11/27/14 fbg.

## 2014-12-01 DIAGNOSIS — M4806 Spinal stenosis, lumbar region: Secondary | ICD-10-CM | POA: Diagnosis not present

## 2014-12-08 ENCOUNTER — Telehealth: Payer: Self-pay | Admitting: Internal Medicine

## 2014-12-08 NOTE — Telephone Encounter (Signed)
Received records from [place] forwarded to Dr. Jenny Reichmann 12/08/14 fbg.

## 2014-12-11 ENCOUNTER — Other Ambulatory Visit: Payer: Self-pay

## 2014-12-29 ENCOUNTER — Other Ambulatory Visit: Payer: Self-pay | Admitting: Gynecology

## 2014-12-29 DIAGNOSIS — Z1231 Encounter for screening mammogram for malignant neoplasm of breast: Secondary | ICD-10-CM

## 2015-01-25 ENCOUNTER — Encounter: Payer: Self-pay | Admitting: Internal Medicine

## 2015-01-25 ENCOUNTER — Ambulatory Visit (INDEPENDENT_AMBULATORY_CARE_PROVIDER_SITE_OTHER): Payer: Medicare Other | Admitting: Internal Medicine

## 2015-01-25 VITALS — BP 112/70 | HR 98 | Temp 99.3°F | Ht 65.0 in | Wt 192.2 lb

## 2015-01-25 DIAGNOSIS — H9193 Unspecified hearing loss, bilateral: Secondary | ICD-10-CM

## 2015-01-25 DIAGNOSIS — H6692 Otitis media, unspecified, left ear: Secondary | ICD-10-CM

## 2015-01-25 DIAGNOSIS — I10 Essential (primary) hypertension: Secondary | ICD-10-CM

## 2015-01-25 DIAGNOSIS — M545 Low back pain, unspecified: Secondary | ICD-10-CM

## 2015-01-25 MED ORDER — TRAMADOL HCL 50 MG PO TABS: 50.0000 mg | ORAL_TABLET | Freq: Three times a day (TID) | ORAL | Status: DC | PRN

## 2015-01-25 MED ORDER — LEVOFLOXACIN 250 MG PO TABS: 250.0000 mg | ORAL_TABLET | Freq: Every day | ORAL | Status: DC

## 2015-01-25 NOTE — Assessment & Plan Note (Signed)
Ok for tramadol prn,  to f/u any worsening symptoms or concerns , chronic stable

## 2015-01-25 NOTE — Assessment & Plan Note (Signed)
stable overall by history and exam, recent data reviewed with pt, and pt to continue medical treatment as before,  to f/u any worsening symptoms or concerns BP Readings from Last 3 Encounters:  01/25/15 112/70  10/24/14 138/80  08/30/14 138/80

## 2015-01-25 NOTE — Assessment & Plan Note (Signed)
Mild to mod, for antibx course,  to f/u any worsening symptoms or concerns 

## 2015-01-25 NOTE — Progress Notes (Addendum)
Subjective:    Patient ID: Daisy Dillon, female    DOB: May 02, 1946, 69 y.o.   MRN: [id number]  HPI Here to f/u with 2 days onset feeling warm, left > right ear pain/pressure wit mild HA and fatigue, without sinus congestion, ST, cough and Pt denies chest pain, increased sob or doe, wheezing, orthopnea, PND, increased LE swelling, palpitations, dizziness or syncope.  Denies urinary symptoms such as dysuria, frequency, urgency, flank pain, hematuria or n/v, fever, chills. Has low grade temp today of which she was unaware.   Pt denies polydipsia, polyuria. Pt continues to have recurring right LBP without change in severity, bowel or bladder change, fever, wt loss,  worsening LE pain/numbness/weakness, gait change or falls, has not responded to [facility] per ortho  Has known renal cyst and wondering if could be related   Past Medical History  Diagnosis Date  . Diabetes mellitus   . IBS (irritable bowel syndrome)   . GERD (gastroesophageal reflux disease)   . Elevated cholesterol   . Hiatal hernia   . Gastritis   . Diverticulosis of colon (without mention of hemorrhage)   . Stricture and stenosis of esophagus 09/03/2005  . Hypertension   . Fibroid   . Urinary incontinence   . Positive ANA (antinuclear antibody) 04/23/2012   Past Surgical History  Procedure Laterality Date  . Abdominal hysterectomy  1985  . Cholecystectomy    . Knee surgery Right     arthroscopic  . Colonoscopy  12/15/2007    diverticulosis  . Esophagogastroduodenoscopy  12/24/2012    normal     reports that she has never smoked. She has never used smokeless tobacco. She reports that she does not drink alcohol or use illicit drugs. family history includes Breast cancer in her maternal aunt; Diabetes in her mother; Hypertension in her mother; Stroke in her brother. There is no history of Colon cancer, Stomach cancer, Throat cancer, or Liver disease. Allergies  Allergen Reactions  . Codeine Phosphate     REACTION:  unspecified, nausea, vomiting   Current Outpatient Prescriptions on File Prior to Visit  Medication Sig Dispense Refill  . acetaminophen (TYLENOL) 650 MG CR tablet Take 650 mg by mouth every 8 (eight) hours as needed for pain.    Marland Kitchen aspirin 81 MG tablet Take 81 mg by mouth daily.      . bisoprolol-hydrochlorothiazide (ZIAC) 2.5-6.25 MG per tablet Take 1 tablet by mouth daily. 90 tablet 1  . bisoprolol-hydrochlorothiazide (ZIAC) 2.5-6.25 MG per tablet TAKE ONE TABLET BY MOUTH ONCE DAILY 30 tablet 3  . Calcium Carbonate-Vitamin D (CALCIUM + D PO) Take by mouth.      . Cholecalciferol (VITAMIN D3) 1000 UNITS CAPS Take 1 capsule by mouth daily.      Marland Kitchen dexlansoprazole (DEXILANT) 60 MG capsule Take 60 mg by mouth daily.    Marland Kitchen dicyclomine (BENTYL) 10 MG capsule Take 1 capsule by mouth  twice a day as needed 60 capsule 1  . EAR WAX REMOVAL DROPS 6.5 % otic solution     . glucosamine-chondroitin 500-400 MG tablet Take 1 tablet by mouth 3 (three) times daily.    Marland Kitchen losartan (COZAAR) 50 MG tablet Take 1 tablet (50 mg total) by mouth daily. 90 tablet 1  . metFORMIN (GLUCOPHAGE) 500 MG tablet Take 1 tablet (500 mg total) by mouth 2 (two) times daily with a meal. 60 tablet 1  . metFORMIN (GLUCOPHAGE) 500 MG tablet TAKE 1 TABLET BY MOUTH 2  TIMES DAILY WITH  A MEAL. 180 tablet 1  . Multiple Vitamin (MULTIVITAMIN) tablet Take 1 tablet by mouth daily.      . ONE TOUCH ULTRA TEST test strip USE AS DIRECTED EVERY DAY 50 each 3  . pioglitazone (ACTOS) 15 MG tablet Take 1 tablet (15 mg total) by mouth daily. 90 tablet 1  . pravastatin (PRAVACHOL) 20 MG tablet Take 1 tablet by mouth  daily 90 tablet 1  . verapamil (VERELAN PM) 240 MG 24 hr capsule Take 1 capsule (240 mg total) by mouth at bedtime. 90 capsule 1   No current facility-administered medications on file prior to visit.   Review of Systems  Constitutional: Negative for unusual diaphoresis or night sweats HENT: Negative for ringing in ear or  discharge Eyes: Negative for double vision or worsening visual disturbance.  Respiratory: Negative for choking and stridor.   Gastrointestinal: Negative for vomiting or other signifcant bowel change Genitourinary: Negative for hematuria or change in urine volume.  Musculoskeletal: Negative for other MSK pain or swelling Skin: Negative for color change and worsening wound.  Neurological: Negative for tremors and numbness other than noted  Psychiatric/Behavioral: Negative for decreased concentration or agitation other than above       Objective:   Physical Exam BP 112/70 mmHg  Pulse 98  Temp(Src) 99.3 F (37.4 C) (Oral)  Ht 5\' 5"  (1.651 m)  Wt 192 lb 4 oz (87.204 kg)  BMI 31.99 kg/m2  SpO2 98% VS noted, bright affect but warm to touch Constitutional: Pt appears in no significant distress HENT: Head: NCAT.  Right Ear: External ear normal.  Left Ear: External ear normal.  Eyes: . Pupils are equal, round, and reactive to light. Conjunctivae and EOM are normal bilat wax impactions irrigated Left TM severe erythema, slight bulging Neck: Normal range of motion. Neck supple.  Cardiovascular: Normal rate and regular rhythm.   Pulmonary/Chest: Effort normal and breath sounds without rales or wheezing.  Abd:  Soft, NT, ND, + BS, no flank tender Spine nontender Neurological: Pt is alert. Not confused , motor grossly intact Skin: Skin is warm. No rash, no LE edema Psychiatric: Pt behavior is normal. No agitation.     Assessment & Plan:

## 2015-01-25 NOTE — Patient Instructions (Addendum)
Your ears were irrigated of wax today  Please take all new medication as prescribed - the antibiotic, and the pain med  Please continue all other medications as before, and refills have been done if requested.  Please have the pharmacy call with any other refills you may need.  Please keep your appointments with your specialists as you may have planned

## 2015-01-25 NOTE — Addendum Note (Signed)
Addended by: Biagio Borg on: 01/25/2015 05:12 PM   Modules accepted: Orders

## 2015-01-25 NOTE — Assessment & Plan Note (Signed)
C/w wax impactions now irrigated , hearing improved,  to f/u any worsening symptoms or concerns

## 2015-01-25 NOTE — Progress Notes (Signed)
Pre visit review using our clinic review tool, if applicable. No additional management support is needed unless otherwise documented below in the visit note. 

## 2015-01-31 ENCOUNTER — Ambulatory Visit (HOSPITAL_COMMUNITY)
Admission: RE | Admit: 2015-01-31 | Discharge: 2015-01-31 | Disposition: A | Discharge status: Home / Self Care | Payer: Medicare Other | Source: Ambulatory Visit | Attending: Gynecology | Admitting: Gynecology

## 2015-01-31 DIAGNOSIS — Z1231 Encounter for screening mammogram for malignant neoplasm of breast: Secondary | ICD-10-CM

## 2015-02-09 ENCOUNTER — Other Ambulatory Visit: Payer: Self-pay | Admitting: Nurse Practitioner

## 2015-02-22 ENCOUNTER — Other Ambulatory Visit (INDEPENDENT_AMBULATORY_CARE_PROVIDER_SITE_OTHER): Payer: Medicare Other

## 2015-02-22 ENCOUNTER — Other Ambulatory Visit: Payer: Medicare Other

## 2015-02-22 DIAGNOSIS — E7800 Pure hypercholesterolemia, unspecified: Secondary | ICD-10-CM

## 2015-02-22 DIAGNOSIS — E119 Type 2 diabetes mellitus without complications: Secondary | ICD-10-CM

## 2015-02-22 LAB — COMPREHENSIVE METABOLIC PANEL
ALT: 24 U/L (ref 0–35)
AST: 27 U/L (ref 0–37)
Albumin: 4.1 g/dL (ref 3.5–5.2)
Alkaline Phosphatase: 92 U/L (ref 39–117)
BUN: 18 mg/dL (ref 6–23)
CO2: 26 mEq/L (ref 19–32)
Calcium: 9.8 mg/dL (ref 8.4–10.5)
Chloride: 105 mEq/L (ref 96–112)
Creatinine, Ser: 1.1 mg/dL (ref 0.40–1.20)
GFR: 63.14 mL/min (ref >60.00)
Glucose, Bld: 92 mg/dL (ref 70–99)
Potassium: 3.8 mEq/L (ref 3.5–5.1)
Sodium: 139 mEq/L (ref 135–145)
Total Bilirubin: 0.4 mg/dL (ref 0.2–1.2)
Total Protein: 7.9 g/dL (ref 6.0–8.3)

## 2015-02-22 LAB — LIPID PANEL
Cholesterol: 124 mg/dL (ref 0–200)
HDL: 52.9 mg/dL (ref >39.00)
LDL Cholesterol: 51 mg/dL (ref 0–99)
NonHDL: 71.43
Total CHOL/HDL Ratio: 2
Triglycerides: 100 mg/dL (ref 0.0–149.0)
VLDL: 20 mg/dL (ref 0.0–40.0)

## 2015-02-22 LAB — HEMOGLOBIN A1C: Hgb A1c MFr Bld: 5.6 % (ref 4.6–6.5)

## 2015-02-27 ENCOUNTER — Ambulatory Visit (INDEPENDENT_AMBULATORY_CARE_PROVIDER_SITE_OTHER): Payer: Medicare Other | Admitting: Endocrinology

## 2015-02-27 ENCOUNTER — Encounter: Payer: Self-pay | Admitting: Endocrinology

## 2015-02-27 VITALS — BP 122/82 | HR 82 | Temp 97.9°F | Resp 16 | Ht 65.0 in | Wt 191.6 lb

## 2015-02-27 DIAGNOSIS — E119 Type 2 diabetes mellitus without complications: Secondary | ICD-10-CM

## 2015-02-27 DIAGNOSIS — I10 Essential (primary) hypertension: Secondary | ICD-10-CM

## 2015-02-27 NOTE — Progress Notes (Signed)
Patient ID: Daisy Dillon, female   DOB: 12-15-1945, 69 y.o.   MRN: [id number]   Reason for Appointment: Diabetes follow-up   History of Present Illness   Diagnosis: Type 2 DIABETES MELITUS, date of diagnosis:   2002   She has had stable control of her mild diabetes with combination of low-dose Actos and metformin She usually has had a normal A1c and this is stable at 5.6  She has checked her sugar regularly as directed including after meals Most readings recently are quite normal without much fluctuation This is despite her not being able to do much exercise, has gained some weight since her last visit She is fairly good about following her diet usually  Monitors blood glucose: Less than once a day .    Glucometer: One touch ultra mini         Blood Glucose readings from download: Range 82-132 with several paired readings before and after meals, mostly breakfast and supper Overall median 109         Physical activity: exercise: Minimal because of lower back pain           Complications: are: No history of neuropathy     Wt Readings from Last 3 Encounters:  02/27/15 191 lb 9.6 oz (86.909 kg)  01/25/15 192 lb 4 oz (87.204 kg)  10/24/14 188 lb 3.2 oz (85.367 kg)    Lab Results  Component Value Date   HGBA1C 5.6 02/22/2015   HGBA1C 5.6 10/19/2014   HGBA1C 5.9 05/22/2014   Lab Results  Component Value Date   MICROALBUR 1.0 05/19/2014   LDLCALC 51 02/22/2015   CREATININE 1.10 02/22/2015     Appointment on 02/22/2015  Component Date Value Ref Range Status  . Hgb A1c MFr Bld 02/22/2015 5.6  4.6 - 6.5 % Final   Glycemic Control Guidelines for People with Diabetes:Non Diabetic:  <6%Goal of Therapy: <7%Additional Action Suggested:  >8%   . Sodium 02/22/2015 139  135 - 145 mEq/L Final  . Potassium 02/22/2015 3.8  3.5 - 5.1 mEq/L Final  . Chloride 02/22/2015 105  96 - 112 mEq/L Final  . CO2 02/22/2015 26  19 - 32 mEq/L Final  . Glucose, Bld 02/22/2015 92  70 - 99  mg/dL Final  . BUN 02/22/2015 18  6 - 23 mg/dL Final  . Creatinine, Ser 02/22/2015 1.10  0.40 - 1.20 mg/dL Final  . Total Bilirubin 02/22/2015 0.4  0.2 - 1.2 mg/dL Final  . Alkaline Phosphatase 02/22/2015 92  39 - 117 U/L Final  . AST 02/22/2015 27  0 - 37 U/L Final  . ALT 02/22/2015 24  0 - 35 U/L Final  . Total Protein 02/22/2015 7.9  6.0 - 8.3 g/dL Final  . Albumin 02/22/2015 4.1  3.5 - 5.2 g/dL Final  . Calcium 02/22/2015 9.8  8.4 - 10.5 mg/dL Final  . GFR 02/22/2015 63.14  >60.00 mL/min Final  . Cholesterol 02/22/2015 124  0 - 200 mg/dL Final   ATP III Classification       Desirable:  < 200 mg/dL               Borderline High:  200 - 239 mg/dL          High:  > = 240 mg/dL  . Triglycerides 02/22/2015 100.0  0.0 - 149.0 mg/dL Final   Normal:  <150 mg/dLBorderline High:  150 - 199 mg/dL  . HDL 02/22/2015 52.90  >39.00 mg/dL Final  .  VLDL 02/22/2015 20.0  0.0 - 40.0 mg/dL Final  . LDL Cholesterol 02/22/2015 51  0 - 99 mg/dL Final  . Total CHOL/HDL Ratio 02/22/2015 2   Final                  Men          Women1/2 Average Risk     3.4          3.3Average Risk          5.0          4.42X Average Risk          9.6          7.13X Average Risk          15.0          11.0                      . NonHDL 02/22/2015 71.43   Final   NOTE:  Non-HDL goal should be 30 mg/dL higher than patient's LDL goal (i.e. LDL goal of < 70 mg/dL, would have non-HDL goal of < 100 mg/dL)      Medication List       This list is accurate as of: 02/27/15 10:02 AM.  Always use your most recent med list.               acetaminophen 650 MG CR tablet  Commonly known as:  TYLENOL  Take 650 mg by mouth every 8 (eight) hours as needed for pain.     aspirin 81 MG tablet  Take 81 mg by mouth daily.     bisoprolol-hydrochlorothiazide 2.5-6.25 MG per tablet  Commonly known as:  ZIAC  Take 1 tablet by mouth daily.     CALCIUM + D PO  Take by mouth.     DEXILANT 60 MG capsule  Generic drug:  dexlansoprazole    Take 60 mg by mouth daily.     dicyclomine 10 MG capsule  Commonly known as:  BENTYL  Take 1 tab twice daily as needed .     EAR WAX REMOVAL DROPS 6.5 % otic solution  Generic drug:  carbamide peroxide     glucosamine-chondroitin 500-400 MG tablet  Take 1 tablet by mouth 3 (three) times daily.     levofloxacin 250 MG tablet  Commonly known as:  LEVAQUIN  Take 1 tablet (250 mg total) by mouth daily.     losartan 50 MG tablet  Commonly known as:  COZAAR  Take 1 tablet (50 mg total) by mouth daily.     metFORMIN 500 MG tablet  Commonly known as:  GLUCOPHAGE  Take 1 tablet (500 mg total) by mouth 2 (two) times daily with a meal.     multivitamin tablet  Take 1 tablet by mouth daily.     ONE TOUCH ULTRA TEST test strip  Generic drug:  glucose blood  USE AS DIRECTED EVERY DAY     pioglitazone 15 MG tablet  Commonly known as:  ACTOS  Take 1 tablet (15 mg total) by mouth daily.     pravastatin 20 MG tablet  Commonly known as:  PRAVACHOL  Take 1 tablet by mouth  daily     traMADol 50 MG tablet  Commonly known as:  ULTRAM  Take 1 tablet (50 mg total) by mouth every 8 (eight) hours as needed.     verapamil 240 MG 24 hr capsule  Commonly known as:  VERELAN PM  Take 1 capsule (240 mg total) by mouth at bedtime.     Vitamin D3 1000 UNITS Caps  Take 1 capsule by mouth daily.        Allergies:  Allergies  Allergen Reactions  . Codeine Phosphate     REACTION: unspecified, nausea, vomiting    Past Medical History  Diagnosis Date  . Diabetes mellitus   . IBS (irritable bowel syndrome)   . GERD (gastroesophageal reflux disease)   . Elevated cholesterol   . Hiatal hernia   . Gastritis   . Diverticulosis of colon (without mention of hemorrhage)   . Stricture and stenosis of esophagus 09/03/2005  . Hypertension   . Fibroid   . Urinary incontinence   . Positive ANA (antinuclear antibody) 04/23/2012    Past Surgical History  Procedure Laterality Date  . Abdominal  hysterectomy  1985  . Cholecystectomy    . Knee surgery Right     arthroscopic  . Colonoscopy  12/15/2007    diverticulosis  . Esophagogastroduodenoscopy  12/24/2012    normal     Family History  Problem Relation Age of Onset  . Diabetes Mother   . Hypertension Mother   . Breast cancer Maternal Aunt     Age 9's  . Stroke Brother     x 2  . Colon cancer Neg Hx   . Stomach cancer Neg Hx   . Throat cancer Neg Hx   . Liver disease Neg Hx     Social History:  reports that she has never smoked. She has never used smokeless tobacco. She reports that she does not drink alcohol or use illicit drugs.  Review of Systems:  HYPERTENSION:   She takes losartan 50 mg, diltiazem and Ziac. Was previously having hypokalemia with Dyazide She was told to get a new monitor at home but has not done so  Lab Results  Component Value Date   CREATININE 1.10 02/22/2015   BUN 18 02/22/2015   NA 139 02/22/2015   K 3.8 02/22/2015   CL 105 02/22/2015   CO2 26 02/22/2015     HYPERLIPIDEMIA: The lipid abnormality consists of elevated LDL which is well-controlled on pravastatin 20 mg  Lab Results  Component Value Date   CHOL 124 02/22/2015   HDL 52.90 02/22/2015   LDLCALC 51 02/22/2015   TRIG 100.0 02/22/2015   CHOLHDL 2 02/22/2015         Examination:   Ht 5\' 5"  (1.651 m)  Wt 191 lb 9.6 oz (86.909 kg)  BMI 31.88 kg/m2  Body mass index is 31.88 kg/(m^2).   ASSESSMENT/ PLAN:   Diabetes type 2 with mild obesity She is on metformin 1000 mg a day and low dose Actos She is tolerating these well, has been taking these long-term  The patient's diabetes control appears to be excellent with normal A1c again She has had relatively mild diabetes without any progression over several years She is usually compliant with diet However she has still not been exercising because of low back pain and other issues  She'll continue the same regimen but given her chair exercises to  do  HYPERLIPIDEMIA: Well controlled with LDL at target using low dose Pravachol  Hypertension: Appears well controlled with Ziac, diltiazem and losartan .    Follow-up in 6 months again   [facility] 02/27/2015, 10:02 AM

## 2015-02-28 ENCOUNTER — Other Ambulatory Visit: Payer: Self-pay | Admitting: *Deleted

## 2015-02-28 ENCOUNTER — Telehealth: Payer: Self-pay | Admitting: *Deleted

## 2015-02-28 MED ORDER — LEVOFLOXACIN 250 MG PO TABS: 250.0000 mg | ORAL_TABLET | Freq: Two times a day (BID) | ORAL | Status: DC

## 2015-02-28 NOTE — Telephone Encounter (Signed)
Receive call pt states she is still having some ear pain. MD had rx antibiotic but she sent the medicine back because she didn't think it was hers, but know she see on her after summary visit that he wanted her to take Levofloaxin. Verified which local pharmacy she use inform will send to [place]...Johny Chess

## 2015-03-21 ENCOUNTER — Other Ambulatory Visit: Payer: Self-pay | Admitting: Endocrinology

## 2015-03-23 ENCOUNTER — Other Ambulatory Visit: Payer: Self-pay | Admitting: Endocrinology

## 2015-04-13 ENCOUNTER — Other Ambulatory Visit: Payer: Self-pay | Admitting: Endocrinology

## 2015-04-19 ENCOUNTER — Encounter: Payer: Self-pay | Admitting: Gynecology

## 2015-04-19 ENCOUNTER — Ambulatory Visit (INDEPENDENT_AMBULATORY_CARE_PROVIDER_SITE_OTHER): Payer: Medicare Other | Admitting: Gynecology

## 2015-04-19 VITALS — BP 126/80 | Ht 65.0 in | Wt 189.6 lb

## 2015-04-19 DIAGNOSIS — Z23 Encounter for immunization: Secondary | ICD-10-CM | POA: Diagnosis not present

## 2015-04-19 DIAGNOSIS — N952 Postmenopausal atrophic vaginitis: Secondary | ICD-10-CM

## 2015-04-19 DIAGNOSIS — Z01419 Encounter for gynecological examination (general) (routine) without abnormal findings: Secondary | ICD-10-CM | POA: Diagnosis not present

## 2015-04-19 NOTE — Progress Notes (Signed)
Daisy Dillon Jan 26, 1946 [id number]   History:    69 y.o.  for annual gyn exam with no complaints today. Her record indicated in the past she had tried a Climara transdermal patch but had to discontinue because he continued to follow-up. Also it became too expensive for her. We also had prescribed her heel Elestrin 0.025% transdermal estrogen twice a week and she never started. She states her vasomotor symptoms of very far in between and she is not sexually active. She denies any vaginal bleeding. Last year she was experiencing nocturia and some stretching or incontinence and week of her information on Keagle exercises and is not having those problems currently.She had a total Abdominal hysterectomy in 1985 for fibroids. She has never had cervical or vaginal dysplasia. Her last Pap smear was 2011. She is up-to-date on mammograms. She is having no pelvic pain.Patient's last colonoscopy was less than 10 years and she states that she has never had any polyps or any family history of GI malignancy. She states that her shingles and Tdap vaccine are up-to-date and she would like have the flu vaccine today. Patient's last bone density study was in 2014 and was normal.  Past medical history,surgical history, family history and social history were all reviewed and documented in the EPIC chart.  Gynecologic History No LMP recorded. Patient has had a hysterectomy. Contraception: post menopausal status Last Pap: 2011. Results were: normal Last mammogram: 2016. Results were: normal  Obstetric History OB History  Gravida Para Term Preterm AB SAB TAB Ectopic Multiple Living  2 2 2       2     # Outcome Date GA Lbr Len/2nd Weight Sex Delivery Anes PTL Lv  2 Term           1 Term                ROS: A ROS was performed and pertinent positives and negatives are included in the history.  GENERAL: No fevers or chills. HEENT: No change in vision, no earache, sore throat or sinus congestion. NECK: No pain  or stiffness. CARDIOVASCULAR: No chest pain or pressure. No palpitations. PULMONARY: No shortness of breath, cough or wheeze. GASTROINTESTINAL: No abdominal pain, nausea, vomiting or diarrhea, melena or bright red blood per rectum. GENITOURINARY: No urinary frequency, urgency, hesitancy or dysuria. MUSCULOSKELETAL: No joint or muscle pain, no back pain, no recent trauma. DERMATOLOGIC: No rash, no itching, no lesions. ENDOCRINE: No polyuria, polydipsia, no heat or cold intolerance. No recent change in weight. HEMATOLOGICAL: No anemia or easy bruising or bleeding. NEUROLOGIC: No headache, seizures, numbness, tingling or weakness. PSYCHIATRIC: No depression, no loss of interest in normal activity or change in sleep pattern.     Exam: chaperone present  BP 126/80 mmHg  Ht 5\' 5"  (1.651 m)  Wt 189 lb 9.6 oz (86.002 kg)  BMI 31.55 kg/m2  Body mass index is 31.55 kg/(m^2).  General appearance : Well developed well nourished female. No acute distress HEENT: Eyes: no retinal hemorrhage or exudates,  Neck supple, trachea midline, no carotid bruits, no thyroidmegaly Lungs: Clear to auscultation, no rhonchi or wheezes, or rib retractions  Heart: Regular rate and rhythm, no murmurs or gallops Breast:Examined in sitting and supine position were symmetrical in appearance, no palpable masses or tenderness,  no skin retraction, no nipple inversion, no nipple discharge, no skin discoloration, no axillary or supraclavicular lymphadenopathy Abdomen: no palpable masses or tenderness, no rebound or guarding Extremities: no edema or skin discoloration  or tenderness  Pelvic:  Bartholin, Urethra, Skene Glands: Within normal limits             Vagina: No gross lesions or discharge, atrophic changes  Cervix: Absent  Uterus  absent  Adnexa  Without masses or tenderness  Anus and perineum  normal   Rectovaginal  normal sphincter tone without palpated masses or tenderness             Hemoccult PCP provides      Assessment/Plan:  69 y.o. female for annual exam will schedule her bone density study for later in December. We discussed importance of calcium vitamin D and weightbearing exercises for osteoporosis prevention. Patient lost 5 pounds from last year. Her PCP Dr. Jenny Reichmann is been doing her blood work. Pap smear no longer indicated according to the new guidelines. Patient was reminded on the importance of monthly breast exams. Patient received the flu vaccine today.   Terrance Mass MD, 3:51 PM 04/19/2015

## 2015-04-28 ENCOUNTER — Other Ambulatory Visit: Payer: Self-pay | Admitting: Internal Medicine

## 2015-04-28 ENCOUNTER — Other Ambulatory Visit: Payer: Self-pay | Admitting: Endocrinology

## 2015-04-30 ENCOUNTER — Other Ambulatory Visit: Payer: Self-pay | Admitting: *Deleted

## 2015-04-30 MED ORDER — GLUCOSE BLOOD VI STRP: ORAL_STRIP | Status: DC

## 2015-05-08 ENCOUNTER — Encounter: Payer: Self-pay | Admitting: Internal Medicine

## 2015-05-08 ENCOUNTER — Ambulatory Visit (INDEPENDENT_AMBULATORY_CARE_PROVIDER_SITE_OTHER): Payer: Medicare Other | Admitting: Internal Medicine

## 2015-05-08 VITALS — BP 128/74 | HR 69 | Temp 98.7°F | Ht 65.0 in | Wt 189.0 lb

## 2015-05-08 DIAGNOSIS — E119 Type 2 diabetes mellitus without complications: Secondary | ICD-10-CM | POA: Diagnosis not present

## 2015-05-08 DIAGNOSIS — Z0000 Encounter for general adult medical examination without abnormal findings: Secondary | ICD-10-CM

## 2015-05-08 DIAGNOSIS — Z23 Encounter for immunization: Secondary | ICD-10-CM | POA: Diagnosis not present

## 2015-05-08 NOTE — Assessment & Plan Note (Signed)
stable overall by history and exam, recent data reviewed with pt, and pt to continue medical treatment as before,  to f/u any worsening symptoms or concerns Lab Results  Component Value Date   HGBA1C 5.6 02/22/2015

## 2015-05-08 NOTE — Addendum Note (Signed)
Addended by: Lyman Bishop on: 05/08/2015 10:27 AM   Modules accepted: Orders

## 2015-05-08 NOTE — Progress Notes (Signed)
Subjective:    Patient ID: Daisy Dillon, female    DOB: [birth year], 69 y.o.   MRN: [id number]  HPI  Here for wellness and f/u;  Overall doing ok;  Pt denies Chest pain, worsening SOB, DOE, wheezing, orthopnea, PND, worsening LE edema, palpitations, dizziness or syncope.  Pt denies neurological change such as new headache, facial or extremity weakness.  Pt denies polydipsia, polyuria, or low sugar symptoms. Pt states overall good compliance with treatment and medications, good tolerability, and has been trying to follow appropriate diet.  Pt denies worsening depressive symptoms, suicidal ideation or panic. No fever, night sweats, wt loss, loss of appetite, or other constitutional symptoms.  Pt states good ability with ADL's, has low fall risk, home safety reviewed and adequate, no other significant changes in hearing or vision, and only occasionally active with exercise. No current complaints  Pt continues to have recurring LBP without change in severity, bowel or bladder change, fever, wt loss,  worsening LE pain/numbness/weakness, gait change or falls, seeing Dr Rolena Infante and Dr Gloris Manchester for pain but not improved so far, hard to be active with standing.  Given chair excercises per Dr Valeta Harms Readings from Last 3 Encounters:  05/08/15 189 lb (85.73 kg)  04/19/15 189 lb 9.6 oz (86.002 kg)  02/27/15 191 lb 9.6 oz (86.909 kg)   BP Readings from Last 3 Encounters:  05/08/15 128/74  04/19/15 126/80  02/27/15 122/82   Past Medical History  Diagnosis Date  . Diabetes mellitus   . IBS (irritable bowel syndrome)   . GERD (gastroesophageal reflux disease)   . Elevated cholesterol   . Hiatal hernia   . Gastritis   . Diverticulosis of colon (without mention of hemorrhage)   . Stricture and stenosis of esophagus 09/03/2005  . Hypertension   . Fibroid   . Urinary incontinence   . Positive ANA (antinuclear antibody) 04/23/2012   Past Surgical History  Procedure Laterality Date  . Abdominal  hysterectomy  1985  . Cholecystectomy    . Knee surgery Right     arthroscopic  . Colonoscopy  12/15/2007    diverticulosis  . Esophagogastroduodenoscopy  12/24/2012    normal     reports that she has never smoked. She has never used smokeless tobacco. She reports that she does not drink alcohol or use illicit drugs. family history includes Breast cancer in her maternal aunt; Diabetes in her mother; Hypertension in her mother; Stroke in her brother. There is no history of Colon cancer, Stomach cancer, Throat cancer, or Liver disease. Allergies  Allergen Reactions  . Codeine Phosphate     REACTION: unspecified, nausea, vomiting   Current Outpatient Prescriptions on File Prior to Visit  Medication Sig Dispense Refill  . acetaminophen (TYLENOL) 650 MG CR tablet Take 650 mg by mouth every 8 (eight) hours as needed for pain.    Marland Kitchen aspirin 81 MG tablet Take 81 mg by mouth daily.      . bisoprolol-hydrochlorothiazide (ZIAC) 2.5-6.25 MG per tablet Take 1 tablet by mouth daily. 90 tablet 1  . Calcium Carbonate-Vitamin D (CALCIUM + D PO) Take by mouth.      . Cholecalciferol (VITAMIN D3) 1000 UNITS CAPS Take 1 capsule by mouth daily.      Marland Kitchen dexlansoprazole (DEXILANT) 60 MG capsule Take 60 mg by mouth daily.    Marland Kitchen dicyclomine (BENTYL) 10 MG capsule Take 1 tab twice daily as needed . 60 capsule 5  . EAR WAX REMOVAL DROPS 6.5 % otic  solution     . glucosamine-chondroitin 500-400 MG tablet Take 1 tablet by mouth 3 (three) times daily.    Marland Kitchen glucose blood (ONE TOUCH ULTRA TEST) test strip USE AS DIRECTED EVERY DAY Dx code E11.9 50 each 3  . levofloxacin (LEVAQUIN) 250 MG tablet Take 1 tablet (250 mg total) by mouth 2 (two) times daily. Take 1 tablet twice a day (Patient not taking: Reported on 05/08/2015) 10 tablet 0  . losartan (COZAAR) 50 MG tablet Take 1 tablet by mouth  daily 90 tablet 1  . metFORMIN (GLUCOPHAGE) 500 MG tablet Take 1 tablet (500 mg total) by mouth 2 (two) times daily with a meal. 60  tablet 1  . Multiple Vitamin (MULTIVITAMIN) tablet Take 1 tablet by mouth daily.      . pioglitazone (ACTOS) 15 MG tablet Take 1 tablet by mouth  daily 90 tablet 1  . pravastatin (PRAVACHOL) 20 MG tablet Take 1 tablet by mouth  daily 90 tablet 1  . traMADol (ULTRAM) 50 MG tablet Take 1 tablet (50 mg total) by mouth every 8 (eight) hours as needed. 120 tablet 2  . verapamil (CALAN-SR) 240 MG CR tablet Take 1 tablet by mouth at  bedtime 90 tablet 1  . verapamil (VERELAN PM) 240 MG 24 hr capsule Take 1 capsule (240 mg total) by mouth at bedtime. 90 capsule 1   No current facility-administered medications on file prior to visit.    Review of Systems Constitutional: Negative for increased diaphoresis, other activity, appetite or siginficant weight change other than noted HENT: Negative for worsening hearing loss, ear pain, facial swelling, mouth sores and neck stiffness.   Eyes: Negative for other worsening pain, redness or visual disturbance.  Respiratory: Negative for shortness of breath and wheezing  Cardiovascular: Negative for chest pain and palpitations.  Gastrointestinal: Negative for diarrhea, blood in stool, abdominal distention or other pain Genitourinary: Negative for hematuria, flank pain or change in urine volume.  Musculoskeletal: Negative for myalgias or other joint complaints.  Skin: Negative for color change and wound or drainage.  Neurological: Negative for syncope and numbness. other than noted Hematological: Negative for adenopathy. or other swelling Psychiatric/Behavioral: Negative for hallucinations, SI, self-injury, decreased concentration or other worsening agitation.      Objective:   Physical Exam BP 128/74 mmHg  Pulse 69  Temp(Src) 98.7 F (37.1 C) (Oral)  Ht 5\' 5"  (1.651 m)  Wt 189 lb (85.73 kg)  BMI 31.45 kg/m2  SpO2 98% VS noted,  Constitutional: Pt is oriented to person, place, and time. Appears well-developed and well-nourished, in no significant  distress Head: Normocephalic and atraumatic.  Right Ear: External ear normal.  Left Ear: External ear normal.  Nose: Nose normal.  Mouth/Throat: Oropharynx is clear and moist.  Eyes: Conjunctivae and EOM are normal. Pupils are equal, round, and reactive to light.  Neck: Normal range of motion. Neck supple. No JVD present. No tracheal deviation present or significant neck LA or mass Cardiovascular: Normal rate, regular rhythm, normal heart sounds and intact distal pulses.   Pulmonary/Chest: Effort normal and breath sounds without rales or wheezing  Abdominal: Soft. Bowel sounds are normal. NT. No HSM  Musculoskeletal: Normal range of motion. Exhibits no edema.  Lymphadenopathy:  Has no cervical adenopathy.  Neurological: Pt is alert and oriented to person, place, and time. Pt has normal reflexes. No cranial nerve deficit. Motor grossly intact Skin: Skin is warm and dry. No rash noted.  Psychiatric:  Has normal mood and affect. Behavior  is normal.     Assessment & Plan:

## 2015-05-08 NOTE — Progress Notes (Signed)
Pre visit review using our clinic review tool, if applicable. No additional management support is needed unless otherwise documented below in the visit note. 

## 2015-05-08 NOTE — Patient Instructions (Addendum)
You had the pneumovax shot today  Please continue all other medications as before, and refills have been done if requested.  Please have the pharmacy call with any other refills you may need.  Please continue your efforts at being more active, low cholesterol diet, and weight control.  You are otherwise up to date with prevention measures today.  Please keep your appointments with your specialists as you may have planned  Please return in 1 year for your yearly visit, or sooner if needed, with Lab testing done 3-5 days before

## 2015-05-08 NOTE — Assessment & Plan Note (Signed)

## 2015-05-14 ENCOUNTER — Other Ambulatory Visit: Payer: Self-pay | Admitting: Gynecology

## 2015-05-14 DIAGNOSIS — Z780 Asymptomatic menopausal state: Secondary | ICD-10-CM

## 2015-05-18 DIAGNOSIS — M4317 Spondylolisthesis, lumbosacral region: Secondary | ICD-10-CM | POA: Diagnosis not present

## 2015-05-23 DIAGNOSIS — M4806 Spinal stenosis, lumbar region: Secondary | ICD-10-CM | POA: Diagnosis not present

## 2015-05-23 DIAGNOSIS — M4317 Spondylolisthesis, lumbosacral region: Secondary | ICD-10-CM | POA: Diagnosis not present

## 2015-05-25 DIAGNOSIS — M4806 Spinal stenosis, lumbar region: Secondary | ICD-10-CM | POA: Diagnosis not present

## 2015-05-28 DIAGNOSIS — M4317 Spondylolisthesis, lumbosacral region: Secondary | ICD-10-CM | POA: Diagnosis not present

## 2015-05-28 DIAGNOSIS — M4806 Spinal stenosis, lumbar region: Secondary | ICD-10-CM | POA: Diagnosis not present

## 2015-05-30 DIAGNOSIS — M4806 Spinal stenosis, lumbar region: Secondary | ICD-10-CM | POA: Diagnosis not present

## 2015-05-30 DIAGNOSIS — M4316 Spondylolisthesis, lumbar region: Secondary | ICD-10-CM | POA: Diagnosis not present

## 2015-06-04 DIAGNOSIS — M4316 Spondylolisthesis, lumbar region: Secondary | ICD-10-CM | POA: Diagnosis not present

## 2015-06-04 DIAGNOSIS — M4806 Spinal stenosis, lumbar region: Secondary | ICD-10-CM | POA: Diagnosis not present

## 2015-06-05 ENCOUNTER — Ambulatory Visit (INDEPENDENT_AMBULATORY_CARE_PROVIDER_SITE_OTHER): Payer: Medicare Other

## 2015-06-05 DIAGNOSIS — Z780 Asymptomatic menopausal state: Secondary | ICD-10-CM | POA: Diagnosis not present

## 2015-06-05 DIAGNOSIS — Z13820 Encounter for screening for osteoporosis: Secondary | ICD-10-CM

## 2015-06-06 DIAGNOSIS — M4806 Spinal stenosis, lumbar region: Secondary | ICD-10-CM | POA: Diagnosis not present

## 2015-06-08 DIAGNOSIS — E119 Type 2 diabetes mellitus without complications: Secondary | ICD-10-CM | POA: Diagnosis not present

## 2015-07-05 ENCOUNTER — Other Ambulatory Visit: Payer: Self-pay | Admitting: Endocrinology

## 2015-07-10 DIAGNOSIS — M4806 Spinal stenosis, lumbar region: Secondary | ICD-10-CM | POA: Diagnosis not present

## 2015-07-10 DIAGNOSIS — M4316 Spondylolisthesis, lumbar region: Secondary | ICD-10-CM | POA: Diagnosis not present

## 2015-07-20 DIAGNOSIS — M47816 Spondylosis without myelopathy or radiculopathy, lumbar region: Secondary | ICD-10-CM | POA: Diagnosis not present

## 2015-07-23 ENCOUNTER — Other Ambulatory Visit: Payer: Self-pay | Admitting: Endocrinology

## 2015-08-09 DIAGNOSIS — M47816 Spondylosis without myelopathy or radiculopathy, lumbar region: Secondary | ICD-10-CM | POA: Diagnosis not present

## 2015-08-13 ENCOUNTER — Other Ambulatory Visit: Payer: Self-pay | Admitting: Endocrinology

## 2015-08-21 ENCOUNTER — Other Ambulatory Visit: Payer: Self-pay | Admitting: *Deleted

## 2015-08-21 MED ORDER — METFORMIN HCL 500 MG PO TABS: ORAL_TABLET | ORAL | Status: DC

## 2015-08-23 ENCOUNTER — Other Ambulatory Visit (INDEPENDENT_AMBULATORY_CARE_PROVIDER_SITE_OTHER): Payer: Medicare Other

## 2015-08-23 DIAGNOSIS — E119 Type 2 diabetes mellitus without complications: Secondary | ICD-10-CM | POA: Diagnosis not present

## 2015-08-23 LAB — COMPREHENSIVE METABOLIC PANEL
ALT: 21 U/L (ref 0–35)
AST: 21 U/L (ref 0–37)
Albumin: 4 g/dL (ref 3.5–5.2)
Alkaline Phosphatase: 92 U/L (ref 39–117)
BUN: 18 mg/dL (ref 6–23)
CO2: 29 mEq/L (ref 19–32)
Calcium: 9.6 mg/dL (ref 8.4–10.5)
Chloride: 106 mEq/L (ref 96–112)
Creatinine, Ser: 1.13 mg/dL (ref 0.40–1.20)
GFR: 61.12 mL/min (ref >60.00)
Glucose, Bld: 96 mg/dL (ref 70–99)
Potassium: 3.7 mEq/L (ref 3.5–5.1)
Sodium: 144 mEq/L (ref 135–145)
Total Bilirubin: 0.4 mg/dL (ref 0.2–1.2)
Total Protein: 7.7 g/dL (ref 6.0–8.3)

## 2015-08-23 LAB — HEMOGLOBIN A1C: Hgb A1c MFr Bld: 5.8 % (ref 4.6–6.5)

## 2015-08-28 ENCOUNTER — Encounter: Payer: Self-pay | Admitting: Endocrinology

## 2015-08-28 ENCOUNTER — Ambulatory Visit (INDEPENDENT_AMBULATORY_CARE_PROVIDER_SITE_OTHER): Payer: Medicare Other | Admitting: Endocrinology

## 2015-08-28 VITALS — BP 138/82 | HR 83 | Temp 98.9°F | Resp 16 | Ht 65.0 in | Wt 196.6 lb

## 2015-08-28 DIAGNOSIS — E119 Type 2 diabetes mellitus without complications: Secondary | ICD-10-CM

## 2015-08-28 LAB — POCT URINALYSIS DIPSTICK
Blood, UA: NEGATIVE
Glucose, UA: NEGATIVE
Ketones, UA: NEGATIVE
Leukocytes, UA: NEGATIVE
Nitrite, UA: NEGATIVE
Protein, UA: NEGATIVE
Spec Grav, UA: 1.025
Urobilinogen, UA: 0.2
pH, UA: 6

## 2015-08-28 LAB — MICROALBUMIN / CREATININE URINE RATIO
Creatinine,U: 266 mg/dL
Microalb Creat Ratio: 0.5 mg/g (ref 0.0–30.0)
Microalb, Ur: 1.3 mg/dL (ref 0.0–1.9)

## 2015-08-28 NOTE — Progress Notes (Signed)
Patient ID: Daisy Dillon, female   DOB: 18-Jan-1946, 70 y.o.   MRN: [id number]   Reason for Appointment: Diabetes follow-up   History of Present Illness   Diagnosis: Type 2 DIABETES MELITUS, date of diagnosis:   2002   She has had stable control of her mild diabetes with combination of low-dose Actos and metformin She usually has had a normal A1c and this is stable at 5.8  She has checked her sugar periodically as directed including after meals She had only one relatively high reading possibly the week she had a steroid injection in her back  She has gained weight progressively Still not able to exercise because of low back pain Not clear if she is following her diet She is fairly good about following her diet usually  Monitors blood glucose: Less than once a day .    Glucometer: One touch ultra mini         Blood Glucose readings from download:  Range 91-156 with several paired readings before and after meals, has only one high reading after evening meal on 2/28 Overall median 112        Physical activity: exercise: Minimal because of lower back pain           Complications: are: No history of neuropathy     Wt Readings from Last 3 Encounters:  08/28/15 196 lb 9.6 oz (89.177 kg)  05/08/15 189 lb (85.73 kg)  04/19/15 189 lb 9.6 oz (86.002 kg)    Lab Results  Component Value Date   HGBA1C 5.8 08/23/2015   HGBA1C 5.6 02/22/2015   HGBA1C 5.6 10/19/2014   Lab Results  Component Value Date   MICROALBUR 1.0 05/19/2014   LDLCALC 51 02/22/2015   CREATININE 1.13 08/23/2015     Office Visit on 08/28/2015  Component Date Value Ref Range Status  . Color, UA 08/28/2015 yellow   Final  . Clarity, UA 08/28/2015 clear   Final  . Glucose, UA 08/28/2015 negative   Final  . Bilirubin, UA 08/28/2015 small   Final  . Ketones, UA 08/28/2015 negative   Final  . Spec Grav, UA 08/28/2015 1.025   Final  . Blood, UA 08/28/2015 negative   Final  . pH, UA 08/28/2015 6.0    Final  . Protein, UA 08/28/2015 negative   Final  . Urobilinogen, UA 08/28/2015 0.2   Final  . Nitrite, UA 08/28/2015 negative   Final  . Leukocytes, UA 08/28/2015 Negative  Negative Final  Appointment on 08/23/2015  Component Date Value Ref Range Status  . Hgb A1c MFr Bld 08/23/2015 5.8  4.6 - 6.5 % Final   Glycemic Control Guidelines for People with Diabetes:Non Diabetic:  <6%Goal of Therapy: <7%Additional Action Suggested:  >8%   . Sodium 08/23/2015 144  135 - 145 mEq/L Final  . Potassium 08/23/2015 3.7  3.5 - 5.1 mEq/L Final  . Chloride 08/23/2015 106  96 - 112 mEq/L Final  . CO2 08/23/2015 29  19 - 32 mEq/L Final  . Glucose, Bld 08/23/2015 96  70 - 99 mg/dL Final  . BUN 08/23/2015 18  6 - 23 mg/dL Final  . Creatinine, Ser 08/23/2015 1.13  0.40 - 1.20 mg/dL Final  . Total Bilirubin 08/23/2015 0.4  0.2 - 1.2 mg/dL Final  . Alkaline Phosphatase 08/23/2015 92  39 - 117 U/L Final  . AST 08/23/2015 21  0 - 37 U/L Final  . ALT 08/23/2015 21  0 - 35 U/L Final  .  Total Protein 08/23/2015 7.7  6.0 - 8.3 g/dL Final  . Albumin 08/23/2015 4.0  3.5 - 5.2 g/dL Final  . Calcium 08/23/2015 9.6  8.4 - 10.5 mg/dL Final  . GFR 08/23/2015 61.12  >60.00 mL/min Final      Medication List       This list is accurate as of: 08/28/15  2:03 PM.  Always use your most recent med list.               acetaminophen 650 MG CR tablet  Commonly known as:  TYLENOL  Take 650 mg by mouth every 8 (eight) hours as needed for pain.     aspirin 81 MG tablet  Take 81 mg by mouth daily.     bisoprolol-hydrochlorothiazide 2.5-6.25 MG tablet  Commonly known as:  ZIAC  Take 1 tablet by mouth daily.     CALCIUM + D PO  Take by mouth.     DEXILANT 60 MG capsule  Generic drug:  dexlansoprazole  Take 60 mg by mouth daily.     dicyclomine 10 MG capsule  Commonly known as:  BENTYL  Take 1 tab twice daily as needed .     EAR WAX REMOVAL DROPS 6.5 % otic solution  Generic drug:  carbamide peroxide      glucose blood test strip  Commonly known as:  ONE TOUCH ULTRA TEST  USE AS DIRECTED EVERY DAY Dx code E11.9     losartan 50 MG tablet  Commonly known as:  COZAAR  Take 1 tablet by mouth  daily     metFORMIN 500 MG tablet  Commonly known as:  GLUCOPHAGE  TAKE 1 TABLET BY MOUTH 2  TIMES DAILY WITH A MEAL.     multivitamin tablet  Take 1 tablet by mouth daily.     pioglitazone 15 MG tablet  Commonly known as:  ACTOS  Take 1 tablet by mouth  daily     pravastatin 20 MG tablet  Commonly known as:  PRAVACHOL  Take 1 tablet by mouth  daily     traMADol 50 MG tablet  Commonly known as:  ULTRAM  Take 1 tablet (50 mg total) by mouth every 8 (eight) hours as needed.     verapamil 240 MG CR tablet  Commonly known as:  CALAN-SR  Take 1 tablet by mouth at  bedtime     Vitamin D3 1000 units Caps  Take 1 capsule by mouth daily.        Allergies:  Allergies  Allergen Reactions  . Codeine Phosphate     REACTION: unspecified, nausea, vomiting    Past Medical History  Diagnosis Date  . Diabetes mellitus   . IBS (irritable bowel syndrome)   . GERD (gastroesophageal reflux disease)   . Elevated cholesterol   . Hiatal hernia   . Gastritis   . Diverticulosis of colon (without mention of hemorrhage)   . Stricture and stenosis of esophagus 09/03/2005  . Hypertension   . Fibroid   . Urinary incontinence   . Positive ANA (antinuclear antibody) 04/23/2012    Past Surgical History  Procedure Laterality Date  . Abdominal hysterectomy  1985  . Cholecystectomy    . Knee surgery Right     arthroscopic  . Colonoscopy  12/15/2007    diverticulosis  . Esophagogastroduodenoscopy  12/24/2012    normal     Family History  Problem Relation Age of Onset  . Diabetes Mother   . Hypertension Mother   .  Breast cancer Maternal Aunt     Age 69's  . Stroke Brother     x 2  . Colon cancer Neg Hx   . Stomach cancer Neg Hx   . Throat cancer Neg Hx   . Liver disease Neg Hx     Social  History:  reports that she has never smoked. She has never used smokeless tobacco. She reports that she does not drink alcohol or use illicit drugs.  Review of Systems:  HYPERTENSION:   She takes losartan 50 mg, Verapamil 240 mg and Ziac. Was previously having hypokalemia with Dyazide Currently not monitoring blood pressure at home Recent chemistry normal:  Lab Results  Component Value Date   CREATININE 1.13 08/23/2015   BUN 18 08/23/2015   NA 144 08/23/2015   K 3.7 08/23/2015   CL 106 08/23/2015   CO2 29 08/23/2015     HYPERLIPIDEMIA: The lipid abnormality consists of elevated LDL which is well-controlled on pravastatin 20 mg  Lab Results  Component Value Date   CHOL 124 02/22/2015   HDL 52.90 02/22/2015   LDLCALC 51 02/22/2015   TRIG 100.0 02/22/2015   CHOLHDL 2 02/22/2015         Examination:   BP 138/82 mmHg  Pulse 83  Temp(Src) 98.9 F (37.2 C)  Resp 16  Ht 5\' 5"  (1.651 m)  Wt 196 lb 9.6 oz (89.177 kg)  BMI 32.72 kg/m2  SpO2 97%  Body mass index is 32.72 kg/(m^2).   ASSESSMENT/ PLAN:   Diabetes type 2 with  obesity She is on metformin 1000 mg a day and 15 mg 15 mg Actos She has been taking these medications long-term with continued good efficacy   The patient's diabetes control appears to be excellent with upper normal A1c again She has had relatively mild diabetes without any progression over several years However she continues to gain weight gradually and is not able to do much exercise even know she has been given chair exercises previously Advised her to look at her diet more consistently and consider nutritional consultation  Urine microalbumin to be checked today  HYPERLIPIDEMIA: Well controlled with LDL at target using 20 mg Pravachol  Hypertension: Appears well controlled with Ziac, verapamil and losartan 50 mg, followed by PCP .    Follow-up in 6 months again   [facility] 08/28/2015, 2:03 PM

## 2015-08-31 ENCOUNTER — Other Ambulatory Visit: Payer: Self-pay | Admitting: Nurse Practitioner

## 2015-09-03 ENCOUNTER — Telehealth: Payer: Self-pay | Admitting: Gastroenterology

## 2015-09-03 DIAGNOSIS — M47816 Spondylosis without myelopathy or radiculopathy, lumbar region: Secondary | ICD-10-CM | POA: Diagnosis not present

## 2015-09-03 NOTE — Telephone Encounter (Signed)
Patient states she needs her Dexilant patient assistance filled out some time this week. She states she was set for an appt in May but wants to know if she has to come for the appt before her paperwork can be filled out. I informed her that she can bring her paperwork by the front desk this week and ask for Estill Bamberg and I will fill out it out and fax back. She does need to keep her appt in May but I will fill out before she runs out of medication. Pt verbalized understanding and will bring her paperwork by our office.

## 2015-09-18 DIAGNOSIS — M47816 Spondylosis without myelopathy or radiculopathy, lumbar region: Secondary | ICD-10-CM | POA: Diagnosis not present

## 2015-09-20 ENCOUNTER — Other Ambulatory Visit: Payer: Self-pay | Admitting: Endocrinology

## 2015-10-03 DIAGNOSIS — M47816 Spondylosis without myelopathy or radiculopathy, lumbar region: Secondary | ICD-10-CM | POA: Diagnosis not present

## 2015-10-05 ENCOUNTER — Other Ambulatory Visit: Payer: Self-pay | Admitting: Internal Medicine

## 2015-10-05 NOTE — Telephone Encounter (Signed)
Please advise as Dr. Jenny Reichmann is out of the office until Tuesday

## 2015-10-05 NOTE — Telephone Encounter (Signed)
Medication sent.

## 2015-10-15 ENCOUNTER — Other Ambulatory Visit: Payer: Self-pay | Admitting: Endocrinology

## 2015-10-15 ENCOUNTER — Other Ambulatory Visit: Payer: Self-pay | Admitting: Gastroenterology

## 2015-10-17 ENCOUNTER — Ambulatory Visit: Payer: Medicare Other | Admitting: Gastroenterology

## 2015-10-23 DIAGNOSIS — M47816 Spondylosis without myelopathy or radiculopathy, lumbar region: Secondary | ICD-10-CM | POA: Diagnosis not present

## 2015-10-30 ENCOUNTER — Other Ambulatory Visit: Payer: Self-pay | Admitting: Endocrinology

## 2015-11-02 ENCOUNTER — Encounter (HOSPITAL_COMMUNITY): Payer: Self-pay

## 2015-11-02 ENCOUNTER — Emergency Department (HOSPITAL_COMMUNITY)
Admission: EM | Admit: 2015-11-02 | Discharge: 2015-11-02 | Disposition: A | Discharge status: Home / Self Care | Payer: Medicare Other | Attending: Emergency Medicine | Admitting: Emergency Medicine

## 2015-11-02 ENCOUNTER — Emergency Department (HOSPITAL_COMMUNITY): Payer: Medicare Other

## 2015-11-02 ENCOUNTER — Telehealth: Payer: Self-pay | Admitting: Internal Medicine

## 2015-11-02 DIAGNOSIS — I10 Essential (primary) hypertension: Secondary | ICD-10-CM | POA: Insufficient documentation

## 2015-11-02 DIAGNOSIS — E7800 Pure hypercholesterolemia, unspecified: Secondary | ICD-10-CM | POA: Insufficient documentation

## 2015-11-02 DIAGNOSIS — Z7984 Long term (current) use of oral hypoglycemic drugs: Secondary | ICD-10-CM | POA: Diagnosis not present

## 2015-11-02 DIAGNOSIS — E119 Type 2 diabetes mellitus without complications: Secondary | ICD-10-CM | POA: Insufficient documentation

## 2015-11-02 DIAGNOSIS — Z79899 Other long term (current) drug therapy: Secondary | ICD-10-CM | POA: Insufficient documentation

## 2015-11-02 DIAGNOSIS — R209 Unspecified disturbances of skin sensation: Secondary | ICD-10-CM | POA: Diagnosis not present

## 2015-11-02 DIAGNOSIS — R200 Anesthesia of skin: Secondary | ICD-10-CM

## 2015-11-02 DIAGNOSIS — Z7982 Long term (current) use of aspirin: Secondary | ICD-10-CM | POA: Insufficient documentation

## 2015-11-02 LAB — URINALYSIS, ROUTINE W REFLEX MICROSCOPIC
Bilirubin Urine: NEGATIVE
Glucose, UA: NEGATIVE mg/dL
Hgb urine dipstick: NEGATIVE
Ketones, ur: NEGATIVE mg/dL
Leukocytes, UA: NEGATIVE
Nitrite: NEGATIVE
Protein, ur: NEGATIVE mg/dL
Specific Gravity, Urine: 1.017 (ref 1.005–1.030)
pH: 5.5 (ref 5.0–8.0)

## 2015-11-02 LAB — I-STAT CHEM 8, ED
BUN: 19 mg/dL (ref 6–20)
Calcium, Ion: 1.17 mmol/L (ref 1.13–1.30)
Chloride: 106 mmol/L (ref 101–111)
Creatinine, Ser: 1.1 mg/dL — ABNORMAL HIGH (ref 0.44–1.00)
Glucose, Bld: 93 mg/dL (ref 65–99)
HCT: 42 % (ref 36.0–46.0)
Hemoglobin: 14.3 g/dL (ref 12.0–15.0)
Potassium: 3.8 mmol/L (ref 3.5–5.1)
Sodium: 143 mmol/L (ref 135–145)
TCO2: 22 mmol/L (ref 0–100)

## 2015-11-02 LAB — RAPID URINE DRUG SCREEN, HOSP PERFORMED
Amphetamines: NOT DETECTED
Barbiturates: NOT DETECTED
Benzodiazepines: NOT DETECTED
Cocaine: NOT DETECTED
Opiates: NOT DETECTED
Tetrahydrocannabinol: NOT DETECTED

## 2015-11-02 LAB — PROTIME-INR
INR: 1.22 (ref 0.00–1.49)
Prothrombin Time: 15.1 seconds (ref 11.6–15.2)

## 2015-11-02 LAB — COMPREHENSIVE METABOLIC PANEL
ALT: 31 U/L (ref 14–54)
AST: 33 U/L (ref 15–41)
Albumin: 4.1 g/dL (ref 3.5–5.0)
Alkaline Phosphatase: 91 U/L (ref 38–126)
Anion gap: 10 (ref 5–15)
BUN: 19 mg/dL (ref 6–20)
CO2: 23 mmol/L (ref 22–32)
Calcium: 9.7 mg/dL (ref 8.9–10.3)
Chloride: 107 mmol/L (ref 101–111)
Creatinine, Ser: 1.08 mg/dL — ABNORMAL HIGH (ref 0.44–1.00)
GFR calc Af Amer: 59 mL/min — ABNORMAL LOW (ref >60)
GFR calc non Af Amer: 51 mL/min — ABNORMAL LOW (ref >60)
Glucose, Bld: 97 mg/dL (ref 65–99)
Potassium: 3.7 mmol/L (ref 3.5–5.1)
Sodium: 140 mmol/L (ref 135–145)
Total Bilirubin: 0.6 mg/dL (ref 0.3–1.2)
Total Protein: 8 g/dL (ref 6.5–8.1)

## 2015-11-02 LAB — DIFFERENTIAL
Basophils Absolute: 0 10*3/uL (ref 0.0–0.1)
Basophils Relative: 0 %
Eosinophils Absolute: 0 10*3/uL (ref 0.0–0.7)
Eosinophils Relative: 1 %
Lymphocytes Relative: 43 %
Lymphs Abs: 1.3 10*3/uL (ref 0.7–4.0)
Monocytes Absolute: 0.2 10*3/uL (ref 0.1–1.0)
Monocytes Relative: 8 %
Neutro Abs: 1.5 10*3/uL — ABNORMAL LOW (ref 1.7–7.7)
Neutrophils Relative %: 48 %

## 2015-11-02 LAB — ETHANOL: Alcohol, Ethyl (B): <5 mg/dL (ref <5)

## 2015-11-02 LAB — CBC
HCT: 40.7 % (ref 36.0–46.0)
Hemoglobin: 13.2 g/dL (ref 12.0–15.0)
MCH: 28 pg (ref 26.0–34.0)
MCHC: 32.4 g/dL (ref 30.0–36.0)
MCV: 86.4 fL (ref 78.0–100.0)
Platelets: 220 10*3/uL (ref 150–400)
RBC: 4.71 MIL/uL (ref 3.87–5.11)
RDW: 14.4 % (ref 11.5–15.5)
WBC: 3 10*3/uL — ABNORMAL LOW (ref 4.0–10.5)

## 2015-11-02 LAB — APTT: aPTT: 22 seconds — ABNORMAL LOW (ref 24–37)

## 2015-11-02 LAB — I-STAT TROPONIN, ED: Troponin i, poc: 0 ng/mL (ref 0.00–0.08)

## 2015-11-02 NOTE — ED Notes (Signed)
Pt states facial numbness to left lower face.  Feels like getting shot in dentist.  Started approx 1 hour ago.  No neuro deficits noted.  No numbness down either arm/leg.  No changes in speech or gait.

## 2015-11-02 NOTE — ED Notes (Signed)
Pt ok to go to MRI prior to other treatment testing per MD.  Labs drawn prior to transport.

## 2015-11-02 NOTE — ED Notes (Signed)
PT DISCHARGED. INSTRUCTIONS GIVEN. AAOX3. PT IN NO APPARENT DISTRESS OR PAIN. THE OPPORTUNITY TO ASK QUESTIONS WAS PROVIDED. 

## 2015-11-02 NOTE — Telephone Encounter (Signed)
[place]  Patient Name: Daisy Dillon  Gender: Female  DOB: 25-Aug-1945   Age: 70 Y 65 M 4 D  Return Phone Number: [phone] (Primary)  Address:   City/State/Zip: [place] [address]   Client [facility] - Client  Client Site [facility] - Day  Physician Cathlean Cower - MD  Contact Type Call  Who Is Calling Patient / Member / Family / Caregiver  Call Type Triage / Clinical  Caller Name Damali Denard  Relationship To Patient Self  Return Phone Number [phone] (Primary)  Chief Complaint Numbness  Reason for Call Symptomatic / Request for [place] states she keeps getting numbness under her chin every ten to fifteen minutes. It has happened three or four times.  Appointment Disposition EMR Appointment Not Necessary  Info pasted into Epic Yes  PreDisposition Call another nurse  Translation No   Nurse Assessment  Nurse: Genoveva Ill, RN, Lattie Haw Date/Time (Eastern Time): 11/02/2015 4:28:33 PM  Confirm and document reason for call. If symptomatic, describe symptoms. You must click the next button to save text entered. ---Caller states she keeps getting numbness under her left chin every ten to fifteen minutes. It has happened three or four times  Has the patient traveled out of the country within the last 30 days? ---Not Applicable  Does the patient have any new or worsening symptoms? ---Yes  Will a triage be completed? ---Yes  Related visit to physician within the last 2 weeks? ---No  Does the PT have any chronic conditions? (i.e. diabetes, asthma, etc.) ---Yes  List chronic conditions. ---diabetes, HTN, high cholesterol, IBS, acid reflux  Is this a behavioral health or substance abuse call? ---No     Guidelines      Guideline Title Affirmed Question Affirmed Notes Nurse Date/Time (Eastern Time)  Neurologic Deficit [1] Numbness (i.e., loss of sensation) of the face, arm / hand, or leg / foot on  one side of the body AND [2] sudden onset AND [3] brief (now gone)  Burress, RN, Lattie Haw 11/02/2015 4:30:14 PM   Disp. Time Eilene Ghazi Time) Disposition Final User          11/02/2015 4:34:09 PM Go to ED Now (or PCP triage) Yes Burress, RN, Leland Johns Understands: Yes  Disagree/Comply: Comply     Care Advice Given Per Guideline    GO TO ED NOW (OR PCP TRIAGE): * IF NO PCP TRIAGE: You need to be seen. Go to the [facility] at [facility] within the next hour. Leave as soon as you can. DRIVING: Another adult should drive. CARE ADVICE given per Neurologic Deficit (Adult) guideline.

## 2015-11-02 NOTE — ED Provider Notes (Signed)
CSN: [id number]     Arrival date & time 11/02/15  25 History   First MD Initiated Contact with Patient 11/02/15 1725     Chief Complaint  Patient presents with  . Numbness     (Consider location/radiation/quality/duration/timing/severity/associated sxs/prior Treatment) The history is provided by the patient.  Daisy Dillon is a 70 y.o. female hx of DM, IBS, GERD, Who presenting with left lower face numbness. Started around 4:30 PM today and it is intermittent and comes and goes. Felt that she had a numbing shot at the dentist. Denies any changes in speech or any slurred speech or any weakness. She has history of diabetes and hypertension and many of her family members other had a stroke or mini stroke. She denies any history of stroke in the past. States that she was watching TV when this happened.    Past Medical History  Diagnosis Date  . Diabetes mellitus   . IBS (irritable bowel syndrome)   . GERD (gastroesophageal reflux disease)   . Elevated cholesterol   . Hiatal hernia   . Gastritis   . Diverticulosis of colon (without mention of hemorrhage)   . Stricture and stenosis of esophagus 09/03/2005  . Hypertension   . Fibroid   . Urinary incontinence   . Positive ANA (antinuclear antibody) 04/23/2012   Past Surgical History  Procedure Laterality Date  . Abdominal hysterectomy  1985  . Cholecystectomy    . Knee surgery Right     arthroscopic  . Colonoscopy  12/15/2007    diverticulosis  . Esophagogastroduodenoscopy  12/24/2012    normal    Family History  Problem Relation Age of Onset  . Diabetes Mother   . Hypertension Mother   . Breast cancer Maternal Aunt     Age 37's  . Stroke Brother     x 2  . Colon cancer Neg Hx   . Stomach cancer Neg Hx   . Throat cancer Neg Hx   . Liver disease Neg Hx    Social History  Substance Use Topics  . Smoking status: Never Smoker   . Smokeless tobacco: Never Used  . Alcohol Use: No   OB History    Gravida Para Term  Preterm AB TAB SAB Ectopic Multiple Living   2 2 2       2      Review of Systems  Neurological: Positive for numbness.  All other systems reviewed and are negative.     Allergies  Codeine phosphate  Home Medications   Prior to Admission medications   Medication Sig Start Date End Date Taking? Authorizing Provider  acetaminophen (TYLENOL) 650 MG CR tablet Take 650 mg by mouth every 8 (eight) hours as needed for pain.    Historical Provider, MD  aspirin 81 MG tablet Take 81 mg by mouth daily.      Historical Provider, MD  bisoprolol-hydrochlorothiazide [facility]) 2.5-6.25 MG per tablet Take 1 tablet by mouth daily. 07/13/14   Elayne Snare, MD  Calcium Carbonate-Vitamin D (CALCIUM + D PO) Take by mouth.      Historical Provider, MD  Cholecalciferol (VITAMIN D3) 1000 UNITS CAPS Take 1 capsule by mouth daily.      Historical Provider, MD  dexlansoprazole (DEXILANT) 60 MG capsule Take 60 mg by mouth daily.    Historical Provider, MD  dicyclomine (BENTYL) 10 MG capsule Take 1 capsule by mouth  twice daily as needed 10/16/15   Ladene Artist, MD  EAR WAX REMOVAL DROPS 6.5 %  otic solution  07/07/13   Historical Provider, MD  glucose blood (ONE TOUCH ULTRA TEST) test strip USE AS DIRECTED EVERY DAY Dx code E11.9 04/30/15   Elayne Snare, MD  losartan (COZAAR) 50 MG tablet Take 1 tablet by mouth  daily 10/30/15   Elayne Snare, MD  metFORMIN (GLUCOPHAGE) 500 MG tablet TAKE 1 TABLET BY MOUTH 2  TIMES DAILY WITH A MEAL. 08/21/15   Elayne Snare, MD  Multiple Vitamin (MULTIVITAMIN) tablet Take 1 tablet by mouth daily.      Historical Provider, MD  pioglitazone (ACTOS) 15 MG tablet Take 1 tablet by mouth  daily 09/20/15   Elayne Snare, MD  pravastatin (PRAVACHOL) 20 MG tablet Take 1 tablet by mouth  daily 10/16/15   Elayne Snare, MD  traMADol (ULTRAM) 50 MG tablet TAKE ONE TABLET BY MOUTH EVERY 8 HOURS AS NEEDED 10/05/15   Golden Circle, FNP  verapamil (CALAN-SR) 240 MG CR tablet Take 1 tablet by mouth at  bedtime 08/13/15    Elayne Snare, MD   BP 132/83 mmHg  Pulse 75  Temp(Src) 98.6 F (37 C) (Oral)  Resp 22  SpO2 96% Physical Exam  Constitutional: She is oriented to person, place, and time. She appears well-developed and well-nourished.  HENT:  Head: Normocephalic.  Right Ear: External ear normal.  Left Ear: External ear normal.  Mouth/Throat: Oropharynx is clear and moist.  Eyes: Conjunctivae are normal. Pupils are equal, round, and reactive to light.  Neck: Normal range of motion. Neck supple.  Cardiovascular: Normal rate, regular rhythm and normal heart sounds.   Pulmonary/Chest: Effort normal and breath sounds normal. No respiratory distress. She has no wheezes. She has no rales.  Abdominal: Soft. Bowel sounds are normal. She exhibits no distension. There is no tenderness. There is no rebound.  Musculoskeletal: Normal range of motion.  Neurological: She is alert and oriented to person, place, and time.  CN 2-12 intact. Nl sensation throughout the face and extremities. No facial droop. Nl strength throughout. Nl gait   Skin: Skin is warm and dry.  Psychiatric: She has a normal mood and affect. Her behavior is normal. Judgment and thought content normal.  Nursing note and vitals reviewed.   ED Course  Procedures (including critical care time) Labs Review Labs Reviewed  APTT - Abnormal; Notable for the following:    aPTT 22 (*)    All other components within normal limits  CBC - Abnormal; Notable for the following:    WBC 3.0 (*)    All other components within normal limits  DIFFERENTIAL - Abnormal; Notable for the following:    Neutro Abs 1.5 (*)    All other components within normal limits  COMPREHENSIVE METABOLIC PANEL - Abnormal; Notable for the following:    Creatinine, Ser 1.08 (*)    GFR calc non Af Amer 51 (*)    GFR calc Af Amer 59 (*)    All other components within normal limits  I-STAT CHEM 8, ED - Abnormal; Notable for the following:    Creatinine, Ser 1.10 (*)    All other  components within normal limits  ETHANOL  PROTIME-INR  URINE RAPID DRUG SCREEN, HOSP PERFORMED  URINALYSIS, ROUTINE W REFLEX MICROSCOPIC (NOT AT [facility])  I-STAT TROPOININ, ED    Imaging Review Ct Head Wo Contrast  11/02/2015  CLINICAL DATA:  Numbness in left face. EXAM: CT HEAD WITHOUT CONTRAST TECHNIQUE: Contiguous axial images were obtained from the base of the skull through the vertex without intravenous  contrast. COMPARISON:  None FINDINGS: Paranasal sinuses, mastoid air cells, bones, and extracranial soft tissues are within normal limits. No subdural, epidural, or subarachnoid hemorrhage. No mass, mass effect, or midline shift. The left lateral ventricle is larger than the right posteriorly, likely normal for this patient. No evidence of hydrocephalus is identified on this study. The cerebellum, brainstem, and basal cisterns are within normal limits. No acute cortical ischemia or infarct. IMPRESSION: No acute abnormality. Electronically Signed   By: Dorise Bullion III M.D   On: 11/02/2015 19:19   Mr Angiogram Head Wo Contrast  11/02/2015  CLINICAL DATA:  Facial numbness left lower jaw in beginning at 4:30 p.m. today. EXAM: MRI HEAD WITHOUT CONTRAST MRA HEAD WITHOUT CONTRAST TECHNIQUE: Multiplanar, multiecho pulse sequences of the brain and surrounding structures were obtained without intravenous contrast. Angiographic images of the head were obtained using MRA technique without contrast. COMPARISON:  CT head without contrast from the same day. FINDINGS: MRI HEAD FINDINGS Diffusion-weighted images demonstrate no evidence for acute or subacute infarction. Moderate periventricular and scattered subcortical white matter changes are advanced for age. No acute hemorrhage or mass lesion is present. The ventricles are proportionate to the degree of atrophy. No significant extra-axial fluid collection is present. The internal auditory canals are within normal limits bilaterally. Flow is present in the major  intracranial arteries. The globes and orbits are intact. The paranasal sinuses and mastoid air cells are clear. MRA HEAD FINDINGS The internal carotid arteries are within normal limits from the high cervical segments through the ICA termini bilaterally. The left A1 segment is dominant. There is a high-grade stenosis in the distal right A1 segment. The MCA bifurcations are intact. There is mild attenuation of distal MCA branch vessels bilaterally. The right vertebral artery is dominant. The PICA origins are visualized and normal. The basilar artery is within normal limits. There is some attenuation of distal small vessels. IMPRESSION: 1. Mild distal small vessel disease and age advanced scattered subcortical white matter disease. 2. No acute intracranial abnormality. Electronically Signed   By: San Morelle M.D.   On: 11/02/2015 20:03   Mr Brain Wo Contrast  11/02/2015  CLINICAL DATA:  Facial numbness left lower jaw in beginning at 4:30 p.m. today. EXAM: MRI HEAD WITHOUT CONTRAST MRA HEAD WITHOUT CONTRAST TECHNIQUE: Multiplanar, multiecho pulse sequences of the brain and surrounding structures were obtained without intravenous contrast. Angiographic images of the head were obtained using MRA technique without contrast. COMPARISON:  CT head without contrast from the same day. FINDINGS: MRI HEAD FINDINGS Diffusion-weighted images demonstrate no evidence for acute or subacute infarction. Moderate periventricular and scattered subcortical white matter changes are advanced for age. No acute hemorrhage or mass lesion is present. The ventricles are proportionate to the degree of atrophy. No significant extra-axial fluid collection is present. The internal auditory canals are within normal limits bilaterally. Flow is present in the major intracranial arteries. The globes and orbits are intact. The paranasal sinuses and mastoid air cells are clear. MRA HEAD FINDINGS The internal carotid arteries are within normal  limits from the high cervical segments through the ICA termini bilaterally. The left A1 segment is dominant. There is a high-grade stenosis in the distal right A1 segment. The MCA bifurcations are intact. There is mild attenuation of distal MCA branch vessels bilaterally. The right vertebral artery is dominant. The PICA origins are visualized and normal. The basilar artery is within normal limits. There is some attenuation of distal small vessels. IMPRESSION: 1. Mild distal small vessel  disease and age advanced scattered subcortical white matter disease. 2. No acute intracranial abnormality. Electronically Signed   By: San Morelle M.D.   On: 11/02/2015 20:03   I have personally reviewed and evaluated these images and lab results as part of my medical decision-making.   EKG Interpretation None      MDM   Final diagnoses:  Numbness   Helem Kwasnik Starnes is a 71 y.o. female here with L lower face numbness. No sensory or motor deficit on my exam currently. I discussed case with Dr. Silverio Decamp at 5:30 pm who wants to hold off on code stroke and just do CT head, MRI brain/MRA brain.   8:43 PM Labs unremarkable. MRI showed no acute stroke. Numbness completely resolved. Likely TIA vs peripheral neuropathy. Since symptoms resolved, doesn't have to be admitted for TIA workup. She prefers to see PCP and neurologist outpatient.     Wandra Arthurs, MD 11/02/15 2044

## 2015-11-02 NOTE — Discharge Instructions (Signed)
See your doctor or neurologist for follow up for your numbness   Return to ER if you have worse numbness, trouble speaking, weakness, slurred speech

## 2015-11-08 ENCOUNTER — Ambulatory Visit (INDEPENDENT_AMBULATORY_CARE_PROVIDER_SITE_OTHER): Payer: Medicare Other | Admitting: Neurology

## 2015-11-08 ENCOUNTER — Encounter: Payer: Self-pay | Admitting: Neurology

## 2015-11-08 VITALS — BP 122/76 | HR 76 | Ht 65.0 in | Wt 195.0 lb

## 2015-11-08 DIAGNOSIS — R202 Paresthesia of skin: Secondary | ICD-10-CM | POA: Diagnosis not present

## 2015-11-08 DIAGNOSIS — G451 Carotid artery syndrome (hemispheric): Secondary | ICD-10-CM | POA: Diagnosis not present

## 2015-11-08 NOTE — Progress Notes (Signed)
Reason for visit: Numbness  Referring physician: Adams  Daisy Dillon is a 70 y.o. female  History of present illness:  Ms. Eckersley is a 71 year old right-handed black female with a history of borderline diabetes, hypertension, and dyslipidemia. The patient experienced an episode of numbness and paresthesias involving the left chin that occurred on 11/02/2015. The episode lasted about 30 seconds, then cleared, and then recurred. The patient does not believe that there was any other issue with the numbness, she did not recall any weakness, slurred speech, vision changes, headache, or gait disturbance. The patient denies any dizziness. The patient is not had any recurrence since that time, she feels back to her usual baseline. The patient went to the emergency room and MRI of the brain was done, this shows mild small vessel disease, no acute changes are seen. MRA of the head does not show any significant large vessel or medium vessel stenosis. The patient is following up through this office for the events above. She has never had similar numbness on the face in the past. Her hemoglobin A1c runs about 5.8. Her blood pressures have been under good control. She is on aspirin therapy currently, and she was on aspirin at the time of the sensory event.   Past Medical History  Diagnosis Date  . Diabetes mellitus   . IBS (irritable bowel syndrome)   . GERD (gastroesophageal reflux disease)   . Elevated cholesterol   . Hiatal hernia   . Gastritis   . Diverticulosis of colon (without mention of hemorrhage)   . Stricture and stenosis of esophagus 09/03/2005  . Hypertension   . Fibroid   . Urinary incontinence   . Positive ANA (antinuclear antibody) 04/23/2012    Past Surgical History  Procedure Laterality Date  . Abdominal hysterectomy  1985  . Cholecystectomy    . Knee surgery Right     arthroscopic  . Colonoscopy  12/15/2007    diverticulosis  . Esophagogastroduodenoscopy   12/24/2012    normal     Family History  Problem Relation Age of Onset  . Diabetes Mother   . Hypertension Mother   . Breast cancer Maternal Aunt     Age 21's  . Stroke Brother     x 2  . Colon cancer Neg Hx   . Stomach cancer Neg Hx   . Throat cancer Neg Hx   . Liver disease Neg Hx   . Bipolar disorder Brother   . Stroke Brother   . Diabetes Brother     Social history:  reports that she has never smoked. She has never used smokeless tobacco. She reports that she does not drink alcohol or use illicit drugs.  Medications:  Prior to Admission medications   Medication Sig Start Date End Date Taking? Authorizing Provider  aspirin 81 MG tablet Take 81 mg by mouth daily.     Yes Historical Provider, MD  bisoprolol-hydrochlorothiazide (ZIAC) 2.5-6.25 MG per tablet Take 1 tablet by mouth daily. 07/13/14  Yes Elayne Snare, MD  Calcium Carbonate-Vitamin D (CALCIUM + D PO) Take by mouth.     Yes Historical Provider, MD  Cholecalciferol (VITAMIN D3) 1000 UNITS CAPS Take 1 capsule by mouth daily.     Yes Historical Provider, MD  dexlansoprazole (DEXILANT) 60 MG capsule Take 60 mg by mouth daily.   Yes Historical Provider, MD  dicyclomine (BENTYL) 10 MG capsule Take 1 capsule by mouth  twice daily as needed 10/16/15  Yes Ladene Artist,  MD  EAR WAX REMOVAL DROPS 6.5 % otic solution  07/07/13  Yes Historical Provider, MD  glucose blood (ONE TOUCH ULTRA TEST) test strip USE AS DIRECTED EVERY DAY Dx code E11.9 04/30/15  Yes Elayne Snare, MD  losartan (COZAAR) 50 MG tablet Take 1 tablet by mouth  daily 10/30/15  Yes Elayne Snare, MD  metFORMIN (GLUCOPHAGE) 500 MG tablet TAKE 1 TABLET BY MOUTH 2  TIMES DAILY WITH A MEAL. 08/21/15  Yes Elayne Snare, MD  Multiple Vitamin (MULTIVITAMIN) tablet Take 1 tablet by mouth daily.     Yes Historical Provider, MD  pioglitazone (ACTOS) 15 MG tablet Take 1 tablet by mouth  daily 09/20/15  Yes Elayne Snare, MD  pravastatin (PRAVACHOL) 20 MG tablet Take 1 tablet by mouth  daily  10/16/15  Yes Elayne Snare, MD  traMADol (ULTRAM) 50 MG tablet TAKE ONE TABLET BY MOUTH EVERY 8 HOURS AS NEEDED 10/05/15  Yes Golden Circle, FNP  verapamil (CALAN-SR) 240 MG CR tablet Take 1 tablet by mouth at  bedtime 08/13/15  Yes Elayne Snare, MD  acetaminophen (TYLENOL) 650 MG CR tablet Take 650 mg by mouth every 8 (eight) hours as needed for pain. Reported on 11/08/2015    Historical Provider, MD      Allergies  Allergen Reactions  . Codeine Phosphate     REACTION: unspecified, nausea, vomiting    ROS:  Out of a complete 14 system review of symptoms, the patient complains only of the following symptoms, and all other reviewed systems are negative.  Numbness  Blood pressure 122/76, pulse 76, height 5\' 5"  (1.651 m), weight 195 lb (88.451 kg).  Physical Exam  General: The patient is alert and cooperative at the time of the examination.  Eyes: Pupils are equal, round, and reactive to light. Discs are flat bilaterally.  Neck: The neck is supple, no carotid bruits are noted.  Respiratory: The respiratory examination is clear.  Cardiovascular: The cardiovascular examination reveals a regular rate and rhythm, no obvious murmurs or rubs are noted.  Skin: Extremities are without significant edema.  Neurologic Exam  Mental status: The patient is alert and oriented x 3 at the time of the examination. The patient has apparent normal recent and remote memory, with an apparently normal attention span and concentration ability.  Cranial nerves: Facial symmetry is present. There is good sensation of the face to pinprick and soft touch bilaterally. The strength of the facial muscles and the muscles to head turning and shoulder shrug are normal bilaterally. Speech is well enunciated, no aphasia or dysarthria is noted. Extraocular movements are full. Visual fields are full. The tongue is midline, and the patient has symmetric elevation of the soft palate. No obvious hearing deficits are  noted.  Motor: The motor testing reveals 5 over 5 strength of all 4 extremities. Good symmetric motor tone is noted throughout.  Sensory: Sensory testing is intact to pinprick, soft touch, vibration sensation, and position sense on all 4 extremities. No evidence of extinction is noted.  Coordination: Cerebellar testing reveals good finger-nose-finger and heel-to-shin bilaterally.  Gait and station: Gait is normal. Tandem gait is normal. Romberg is negative. No drift is seen.  Reflexes: Deep tendon reflexes are symmetric and normal bilaterally. Toes are downgoing bilaterally.   MRI brain and MRA head 11/02/15:  IMPRESSION: 1. Mild distal small vessel disease and age advanced scattered subcortical white matter disease. 2. No acute intracranial abnormality.  * MRI scan images were reviewed online. I agree with the  written report.    Assessment/Plan:  1. Transient left lower face numbness  The above event is of unclear clinical significance. This I suppose could have represented a TIA. The patient will be sent for carotid Doppler study and if this is unremarkable, I would not pursue any further evaluation. The patient is to remain on aspirin therapy for now. She is controlling her stroke risk factors well. She will follow-up through this office if needed.  Jill Alexanders MD 11/08/2015 6:45 PM  [facility] [address] [place], [place] [address]  Phone [phone] Fax [phone]

## 2015-11-14 ENCOUNTER — Ambulatory Visit (INDEPENDENT_AMBULATORY_CARE_PROVIDER_SITE_OTHER): Payer: Medicare Other

## 2015-11-14 DIAGNOSIS — R202 Paresthesia of skin: Secondary | ICD-10-CM | POA: Diagnosis not present

## 2015-11-14 DIAGNOSIS — G451 Carotid artery syndrome (hemispheric): Secondary | ICD-10-CM | POA: Diagnosis not present

## 2015-11-20 DIAGNOSIS — M47816 Spondylosis without myelopathy or radiculopathy, lumbar region: Secondary | ICD-10-CM | POA: Diagnosis not present

## 2015-11-21 ENCOUNTER — Telehealth: Payer: Self-pay | Admitting: Neurology

## 2015-11-21 ENCOUNTER — Other Ambulatory Visit: Payer: Self-pay | Admitting: Endocrinology

## 2015-11-21 NOTE — Telephone Encounter (Signed)
I called the patient. The carotid Doppler study was unremarkable, no evidence of stenosis seen on either side. The patient will continue aspirin therapy for now, contact me if any new issues arise.

## 2015-12-07 ENCOUNTER — Ambulatory Visit (INDEPENDENT_AMBULATORY_CARE_PROVIDER_SITE_OTHER): Payer: Medicare Other | Admitting: Gastroenterology

## 2015-12-07 ENCOUNTER — Encounter: Payer: Self-pay | Admitting: Gastroenterology

## 2015-12-07 VITALS — BP 124/74 | HR 84 | Ht 65.35 in | Wt 196.0 lb

## 2015-12-07 DIAGNOSIS — K219 Gastro-esophageal reflux disease without esophagitis: Secondary | ICD-10-CM

## 2015-12-07 DIAGNOSIS — K589 Irritable bowel syndrome without diarrhea: Secondary | ICD-10-CM | POA: Diagnosis not present

## 2015-12-07 MED ORDER — DICYCLOMINE HCL 10 MG PO CAPS: 10.0000 mg | ORAL_CAPSULE | Freq: Three times a day (TID) | ORAL | Status: DC

## 2015-12-07 MED ORDER — DEXLANSOPRAZOLE 60 MG PO CPDR: 60.0000 mg | DELAYED_RELEASE_CAPSULE | Freq: Every day | ORAL | Status: DC

## 2015-12-07 NOTE — Patient Instructions (Addendum)
We have sent the following prescriptions to your mail in pharmacy:Dexilant and Bentyl.   If you have not heard from your mail in pharmacy within 1 week or if you have not received your medication in the mail, please contact us at [phone] so we may find out why.   Normal BMI (Body Mass Index- based on height and weight) is between 23 and 30. Your BMI today is Body mass index is 32.26 kg/(m^2). Marland Kitchen Please consider follow up  regarding your BMI with your Primary Care Provider.   Thank you for choosing me and [facility].  Pricilla Riffle. Dagoberto Ligas., MD., Marval Regal

## 2015-12-07 NOTE — Progress Notes (Signed)
    History of Present Illness: This is a 70 year old female previously followed by Dr. Verl Blalock with GERD and IBS. She returns today complaining of intermittent periumbilical cramping. The symptoms respond to dicyclomine. He generally has 2-3 bowel movements each day often following meals. Her reflux is well controlled. No globus complaints recently. Denies weight loss, abdominal pain, constipation, diarrhea, change in stool caliber, melena, hematochezia, nausea, vomiting, dysphagia, reflux symptoms, chest pain.   Current Medications, Allergies, Past Medical History, Past Surgical History, Family History and Social History were reviewed in [employer] record.  Physical Exam: General: Well developed, well nourished, no acute distress Head: Normocephalic and atraumatic Eyes:  sclerae anicteric, EOMI Ears: Normal auditory acuity Mouth: No deformity or lesions Lungs: Clear throughout to auscultation Heart: Regular rate and rhythm; no murmurs, rubs or bruits Abdomen: Soft, non tender and non distended. No masses, hepatosplenomegaly or hernias noted. Normal Bowel sounds Musculoskeletal: Symmetrical with no gross deformities  Pulses:  Normal pulses noted Extremities: No clubbing, cyanosis, edema or deformities noted Neurological: Alert oriented x 4, grossly nonfocal Psychological:  Alert and cooperative. Normal mood and affect  Assessment and Recommendations:  1. GERD with history of globus sensation. Continue dexlansoprazole 60 mg daily and standard antireflux measures.  2. IBS. Increase dicyclomine to 10 mg qid, ac and hs as needed. She is advised to contact us if her abdominal symptoms are not well controlled on this regimen. REV in 1 year.   2. CRC screening, average risk. 10 year interval screening colonoscopy recommended in July 2019.

## 2015-12-10 ENCOUNTER — Telehealth: Payer: Self-pay | Admitting: Gastroenterology

## 2015-12-10 MED ORDER — DICYCLOMINE HCL 10 MG PO CAPS: 10.0000 mg | ORAL_CAPSULE | Freq: Three times a day (TID) | ORAL | Status: DC

## 2015-12-10 NOTE — Telephone Encounter (Signed)
Prescription resent to [facility] and patient notified.

## 2015-12-21 ENCOUNTER — Other Ambulatory Visit: Payer: Self-pay | Admitting: Endocrinology

## 2015-12-24 DIAGNOSIS — M47816 Spondylosis without myelopathy or radiculopathy, lumbar region: Secondary | ICD-10-CM | POA: Diagnosis not present

## 2016-01-10 DIAGNOSIS — M47816 Spondylosis without myelopathy or radiculopathy, lumbar region: Secondary | ICD-10-CM | POA: Diagnosis not present

## 2016-01-11 ENCOUNTER — Other Ambulatory Visit: Payer: Self-pay | Admitting: Gynecology

## 2016-01-11 DIAGNOSIS — Z1231 Encounter for screening mammogram for malignant neoplasm of breast: Secondary | ICD-10-CM

## 2016-01-12 ENCOUNTER — Other Ambulatory Visit: Payer: Self-pay | Admitting: Endocrinology

## 2016-02-04 ENCOUNTER — Ambulatory Visit
Admission: RE | Admit: 2016-02-04 | Discharge: 2016-02-04 | Disposition: A | Discharge status: Home / Self Care | Payer: Medicare Other | Source: Ambulatory Visit | Attending: Gynecology | Admitting: Gynecology

## 2016-02-04 DIAGNOSIS — Z1231 Encounter for screening mammogram for malignant neoplasm of breast: Secondary | ICD-10-CM

## 2016-02-04 DIAGNOSIS — M47816 Spondylosis without myelopathy or radiculopathy, lumbar region: Secondary | ICD-10-CM | POA: Diagnosis not present

## 2016-02-07 ENCOUNTER — Other Ambulatory Visit: Payer: Self-pay | Admitting: Endocrinology

## 2016-02-20 DIAGNOSIS — M47816 Spondylosis without myelopathy or radiculopathy, lumbar region: Secondary | ICD-10-CM | POA: Diagnosis not present

## 2016-02-22 ENCOUNTER — Other Ambulatory Visit: Payer: Self-pay | Admitting: Endocrinology

## 2016-02-25 ENCOUNTER — Other Ambulatory Visit: Payer: Medicare Other

## 2016-02-28 ENCOUNTER — Ambulatory Visit: Payer: Medicare Other | Admitting: Endocrinology

## 2016-03-04 ENCOUNTER — Other Ambulatory Visit (INDEPENDENT_AMBULATORY_CARE_PROVIDER_SITE_OTHER): Payer: Medicare Other

## 2016-03-04 DIAGNOSIS — E119 Type 2 diabetes mellitus without complications: Secondary | ICD-10-CM

## 2016-03-04 LAB — COMPREHENSIVE METABOLIC PANEL
ALT: 29 U/L (ref 0–35)
AST: 26 U/L (ref 0–37)
Albumin: 3.8 g/dL (ref 3.5–5.2)
Alkaline Phosphatase: 98 U/L (ref 39–117)
BUN: 19 mg/dL (ref 6–23)
CO2: 29 mEq/L (ref 19–32)
Calcium: 9.3 mg/dL (ref 8.4–10.5)
Chloride: 107 mEq/L (ref 96–112)
Creatinine, Ser: 1.08 mg/dL (ref 0.40–1.20)
GFR: 64.3 mL/min (ref >60.00)
Glucose, Bld: 89 mg/dL (ref 70–99)
Potassium: 3.7 mEq/L (ref 3.5–5.1)
Sodium: 143 mEq/L (ref 135–145)
Total Bilirubin: 0.3 mg/dL (ref 0.2–1.2)
Total Protein: 7.6 g/dL (ref 6.0–8.3)

## 2016-03-04 LAB — LIPID PANEL
Cholesterol: 130 mg/dL (ref 0–200)
HDL: 50.7 mg/dL (ref >39.00)
LDL Cholesterol: 61 mg/dL (ref 0–99)
NonHDL: 79.31
Total CHOL/HDL Ratio: 3
Triglycerides: 91 mg/dL (ref 0.0–149.0)
VLDL: 18.2 mg/dL (ref 0.0–40.0)

## 2016-03-04 LAB — HEMOGLOBIN A1C: Hgb A1c MFr Bld: 5.7 % (ref 4.6–6.5)

## 2016-03-06 ENCOUNTER — Other Ambulatory Visit: Payer: Self-pay | Admitting: *Deleted

## 2016-03-06 ENCOUNTER — Ambulatory Visit (INDEPENDENT_AMBULATORY_CARE_PROVIDER_SITE_OTHER): Payer: Medicare Other | Admitting: Endocrinology

## 2016-03-06 ENCOUNTER — Encounter: Payer: Self-pay | Admitting: Endocrinology

## 2016-03-06 VITALS — BP 140/80 | HR 79 | Temp 98.2°F | Resp 16 | Ht 65.0 in | Wt 192.8 lb

## 2016-03-06 DIAGNOSIS — E119 Type 2 diabetes mellitus without complications: Secondary | ICD-10-CM | POA: Diagnosis not present

## 2016-03-06 DIAGNOSIS — E7800 Pure hypercholesterolemia, unspecified: Secondary | ICD-10-CM

## 2016-03-06 MED ORDER — ONETOUCH DELICA LANCETS 33G MISC: 1 refills | Status: DC

## 2016-03-06 NOTE — Progress Notes (Signed)
Patient ID: Daisy Dillon, female   DOB: June 14, 1946, 70 y.o.   MRN: [id number]   Reason for Appointment: Diabetes follow-up   History of Present Illness   Diagnosis: Type 2 DIABETES MELITUS, date of diagnosis:   2002   She has had stable control of her mild diabetes with combination of low-dose Actos and metformin She usually has had a normal A1c and this is stable at 5.7, previously 5.8  She has checked her sugar periodically as directed including after meals  She has finally lost a little weight and she thinks she is following her diet usually Still not able to exercise because of low back pain and has not looked at other forms of exercise than walking  Monitors blood glucose: Less than once a day .    Glucometer: One touch ultra mini         Blood Glucose readings from download:   Range 99-179  with several paired readings before and after meals, has only one high reading after eating Overall median 114        Physical activity: exercise: Minimal because of lower back pain           Complications: are: No history of neuropathy     Wt Readings from Last 3 Encounters:  03/06/16 192 lb 12.8 oz (87.5 kg)  12/07/15 196 lb (88.9 kg)  11/08/15 195 lb (88.5 kg)    Lab Results  Component Value Date   HGBA1C 5.7 03/04/2016   HGBA1C 5.8 08/23/2015   HGBA1C 5.6 02/22/2015   Lab Results  Component Value Date   MICROALBUR 1.3 08/28/2015   LDLCALC 61 03/04/2016   CREATININE 1.08 03/04/2016     Lab on 03/04/2016  Component Date Value Ref Range Status  . Hgb A1c MFr Bld 03/04/2016 5.7  4.6 - 6.5 % Final  . Sodium 03/04/2016 143  135 - 145 mEq/L Final  . Potassium 03/04/2016 3.7  3.5 - 5.1 mEq/L Final  . Chloride 03/04/2016 107  96 - 112 mEq/L Final  . CO2 03/04/2016 29  19 - 32 mEq/L Final  . Glucose, Bld 03/04/2016 89  70 - 99 mg/dL Final  . BUN 03/04/2016 19  6 - 23 mg/dL Final  . Creatinine, Ser 03/04/2016 1.08  0.40 - 1.20 mg/dL Final  . Total Bilirubin  03/04/2016 0.3  0.2 - 1.2 mg/dL Final  . Alkaline Phosphatase 03/04/2016 98  39 - 117 U/L Final  . AST 03/04/2016 26  0 - 37 U/L Final  . ALT 03/04/2016 29  0 - 35 U/L Final  . Total Protein 03/04/2016 7.6  6.0 - 8.3 g/dL Final  . Albumin 03/04/2016 3.8  3.5 - 5.2 g/dL Final  . Calcium 03/04/2016 9.3  8.4 - 10.5 mg/dL Final  . GFR 03/04/2016 64.30  >60.00 mL/min Final  . Cholesterol 03/04/2016 130  0 - 200 mg/dL Final  . Triglycerides 03/04/2016 91.0  0.0 - 149.0 mg/dL Final  . HDL 03/04/2016 50.70  >39.00 mg/dL Final  . VLDL 03/04/2016 18.2  0.0 - 40.0 mg/dL Final  . LDL Cholesterol 03/04/2016 61  0 - 99 mg/dL Final  . Total CHOL/HDL Ratio 03/04/2016 3   Final  . NonHDL 03/04/2016 79.31   Final      Medication List       Accurate as of 03/06/16  9:22 AM. Always use your most recent med list.          acetaminophen 650 MG CR tablet  Commonly known as:  TYLENOL Take 650 mg by mouth every 8 (eight) hours as needed for pain. Reported on 11/08/2015   aspirin 81 MG tablet Take 81 mg by mouth daily.   bisoprolol-hydrochlorothiazide 2.5-6.25 MG tablet Commonly known as:  ZIAC Take 1 tablet by mouth daily.   bisoprolol-hydrochlorothiazide 2.5-6.25 MG tablet Commonly known as:  ZIAC TAKE ONE TABLET BY MOUTH ONCE DAILY   CALCIUM + D PO Take by mouth.   dexlansoprazole 60 MG capsule Commonly known as:  DEXILANT Take 1 capsule (60 mg total) by mouth daily.   dicyclomine 10 MG capsule Commonly known as:  BENTYL Take 1 capsule (10 mg total) by mouth 4 (four) times daily -  before meals and at bedtime.   EAR WAX REMOVAL DROPS 6.5 % otic solution Generic drug:  carbamide peroxide   glucose blood test strip Commonly known as:  ONE TOUCH ULTRA TEST USE AS DIRECTED EVERY DAY Dx code E11.9   losartan 50 MG tablet Commonly known as:  COZAAR Take 1 tablet by mouth  daily   metFORMIN 500 MG tablet Commonly known as:  GLUCOPHAGE Take 1 tablet by mouth two  times daily with  meals   multivitamin tablet Take 1 tablet by mouth daily.   ONETOUCH DELICA LANCETS 99991111 Misc Use to check blood sugar 2 times a week .Dx code E11.9   pioglitazone 15 MG tablet Commonly known as:  ACTOS Take 1 tablet by mouth  daily   pravastatin 20 MG tablet Commonly known as:  PRAVACHOL Take 1 tablet by mouth  daily   traMADol 50 MG tablet Commonly known as:  ULTRAM TAKE ONE TABLET BY MOUTH EVERY 8 HOURS AS NEEDED   verapamil 240 MG CR tablet Commonly known as:  CALAN-SR Take 1 tablet by mouth at  bedtime   Vitamin D3 1000 units Caps Take 1 capsule by mouth daily.       Allergies:  Allergies  Allergen Reactions  . Codeine Phosphate     REACTION: unspecified, nausea, vomiting    Past Medical History:  Diagnosis Date  . Diabetes mellitus   . Diverticulosis of colon (without mention of hemorrhage)   . Elevated cholesterol   . Fibroid   . Gastritis   . GERD (gastroesophageal reflux disease)   . Hiatal hernia   . Hypertension   . IBS (irritable bowel syndrome)   . Positive ANA (antinuclear antibody) 04/23/2012  . Stricture and stenosis of esophagus 09/03/2005  . Urinary incontinence     Past Surgical History:  Procedure Laterality Date  . ABDOMINAL HYSTERECTOMY  1985  . CHOLECYSTECTOMY    . COLONOSCOPY  12/15/2007   diverticulosis  . ESOPHAGOGASTRODUODENOSCOPY  12/24/2012   normal   . KNEE SURGERY Right    arthroscopic    Family History  Problem Relation Age of Onset  . Diabetes Mother   . Hypertension Mother   . Breast cancer Maternal Aunt     Age 66's  . Stroke Brother     x 2  . Colon cancer Neg Hx   . Stomach cancer Neg Hx   . Throat cancer Neg Hx   . Liver disease Neg Hx   . Bipolar disorder Brother   . Stroke Brother   . Diabetes Brother     Social History:  reports that she has never smoked. She has never used smokeless tobacco. She reports that she does not drink alcohol or use drugs.  Review of Systems:  HYPERTENSION:   She  takes losartan 50 mg, Verapamil 240 mg and Ziac. Was previously having hypokalemia with Dyazide She has a monitor at home but recently has not checked  Recent chemistry normal:  Lab Results  Component Value Date   CREATININE 1.08 03/04/2016   BUN 19 03/04/2016   NA 143 03/04/2016   K 3.7 03/04/2016   CL 107 03/04/2016   CO2 29 03/04/2016     HYPERLIPIDEMIA: The lipid abnormality consists of elevated LDL which is well-controlled on pravastatin 20 mg  Lab Results  Component Value Date   CHOL 130 03/04/2016   HDL 50.70 03/04/2016   LDLCALC 61 03/04/2016   TRIG 91.0 03/04/2016   CHOLHDL 3 03/04/2016     Eye exam 12/16    Examination:   BP 140/80   Pulse 79   Temp 98.2 F (36.8 C)   Resp 16   Ht 5\' 5"  (1.651 m)   Wt 192 lb 12.8 oz (87.5 kg)   SpO2 94%   BMI 32.08 kg/m   Body mass index is 32.08 kg/m.   ASSESSMENT/ PLAN:   Diabetes type 2 with  obesity She is on metformin 1000 mg a day and  15 mg Actos She has been taking these medications long-term with continued good Control and A1c in the normal range  The patient's diabetes control appears to be again consistent with good readings at home also She has had relatively mild diabetes without any progression over several years  Although her weight is slightly better she does need to start an exercise program Discussed that she can try to get an exercise program started at the [facility] if they have a program for Medicare patients Encouraged her to look into water aerobics  HYPERLIPIDEMIA: Well controlled with LDL at target using 20 mg Pravachol  Hypertension:  well controlled with Ziac, verapamil and losartan 50 mg, followed by PCP .    Follow-up in 6 months again   [facility] 03/06/2016, 9:22 AM

## 2016-03-07 ENCOUNTER — Encounter: Payer: Self-pay | Admitting: Internal Medicine

## 2016-03-19 ENCOUNTER — Other Ambulatory Visit: Payer: Self-pay | Admitting: Endocrinology

## 2016-03-21 ENCOUNTER — Other Ambulatory Visit: Payer: Self-pay | Admitting: *Deleted

## 2016-03-21 MED ORDER — VERAPAMIL HCL ER 240 MG PO TBCR: 240.0000 mg | EXTENDED_RELEASE_TABLET | Freq: Every day | ORAL | 0 refills | Status: DC

## 2016-04-22 ENCOUNTER — Encounter: Payer: Self-pay | Admitting: Gynecology

## 2016-04-22 ENCOUNTER — Ambulatory Visit (INDEPENDENT_AMBULATORY_CARE_PROVIDER_SITE_OTHER): Payer: Medicare Other | Admitting: Gynecology

## 2016-04-22 ENCOUNTER — Other Ambulatory Visit: Payer: Self-pay | Admitting: Endocrinology

## 2016-04-22 VITALS — BP 130/82 | Ht 65.5 in | Wt 198.0 lb

## 2016-04-22 DIAGNOSIS — Z01419 Encounter for gynecological examination (general) (routine) without abnormal findings: Secondary | ICD-10-CM

## 2016-04-22 DIAGNOSIS — Z23 Encounter for immunization: Secondary | ICD-10-CM

## 2016-04-22 NOTE — Addendum Note (Signed)
Addended by: Thurnell Garbe A on: 04/22/2016 03:24 PM   Modules accepted: Orders

## 2016-04-22 NOTE — Patient Instructions (Signed)

## 2016-04-22 NOTE — Progress Notes (Signed)
Daisy Dillon 1945/09/27 [id number]   History:    70 y.o.  for annual gyn exam who has no complaints today. Patient is no longer any hormone replacement therapy. She is not sexually active. Review of her record indicated that she has a past history of total abdominal hysterectomy 1985 as a result of symptom and fibroids. She has never had any vaginal cervical dysplasia. Her last Pap smear was normal in 2011. Her mammograms are up-to-date. Her primary care physician is Dr. Ronnald Ramp is been doing her blood work. She's requesting her flu vaccine today. Her last colonoscopy was in 2009 which was normal. All her vaccines are otherwise up-to-date by her PCP. Her last bone density study was normal in 2016.  Past medical history,surgical history, family history and social history were all reviewed and documented in the EPIC chart.  Gynecologic History No LMP recorded. Patient has had a hysterectomy. Contraception: post menopausal status Last Pap: 2011. Results were: normal Last mammogram: 2017. Results were: normal  Obstetric History OB History  Gravida Para Term Preterm AB Living  2 2 2     2   SAB TAB Ectopic Multiple Live Births               # Outcome Date GA Lbr Len/2nd Weight Sex Delivery Anes PTL Lv  2 Term           1 Term                ROS: A ROS was performed and pertinent positives and negatives are included in the history.  GENERAL: No fevers or chills. HEENT: No change in vision, no earache, sore throat or sinus congestion. NECK: No pain or stiffness. CARDIOVASCULAR: No chest pain or pressure. No palpitations. PULMONARY: No shortness of breath, cough or wheeze. GASTROINTESTINAL: No abdominal pain, nausea, vomiting or diarrhea, melena or bright red blood per rectum. GENITOURINARY: No urinary frequency, urgency, hesitancy or dysuria. MUSCULOSKELETAL: No joint or muscle pain, no back pain, no recent trauma. DERMATOLOGIC: No rash, no itching, no lesions. ENDOCRINE: No polyuria,  polydipsia, no heat or cold intolerance. No recent change in weight. HEMATOLOGICAL: No anemia or easy bruising or bleeding. NEUROLOGIC: No headache, seizures, numbness, tingling or weakness. PSYCHIATRIC: No depression, no loss of interest in normal activity or change in sleep pattern.     Exam: chaperone present  BP 130/82   Ht 5' 5.5" (1.664 m)   Wt 198 lb (89.8 kg)   BMI 32.45 kg/m   Body mass index is 32.45 kg/m.  General appearance : Well developed well nourished female. No acute distress HEENT: Eyes: no retinal hemorrhage or exudates,  Neck supple, trachea midline, no carotid bruits, no thyroidmegaly Lungs: Clear to auscultation, no rhonchi or wheezes, or rib retractions  Heart: Regular rate and rhythm, no murmurs or gallops Breast:Examined in sitting and supine position were symmetrical in appearance, no palpable masses or tenderness,  no skin retraction, no nipple inversion, no nipple discharge, no skin discoloration, no axillary or supraclavicular lymphadenopathy Abdomen: no palpable masses or tenderness, no rebound or guarding Extremities: no edema or skin discoloration or tenderness  Pelvic:  Bartholin, Urethra, Skene Glands: Within normal limits             Vagina: No gross lesions or discharge, atrophic changes  Cervix: Absent  Uterus absent  Adnexa  Without masses or tenderness  Anus and perineum  normal   Rectovaginal  normal sphincter tone without palpated masses or tenderness  Hemoccult PCP provides     Assessment/Plan:  70 y.o. female for annual exam doing well otherwise received the flu vaccine today after she was counseled and consent form signed. She was reminded of the importance of calcium vitamin D and weightbearing exercises for osteoporosis prevention. She will be due for her next bone density study next year. Pap smear no longer indicated according to the new guidelines. Patient due for her next colonoscopy in 2019. Patient was reminded on the  importance of monthly breast exams as well.   Terrance Mass MD, 3:04 PM 04/22/2016

## 2016-04-23 ENCOUNTER — Telehealth: Payer: Self-pay | Admitting: *Deleted

## 2016-04-23 NOTE — Telephone Encounter (Signed)
Pt said took her weight at home and it said 193, not 198 as in office visit on 04/22/16

## 2016-04-25 ENCOUNTER — Other Ambulatory Visit: Payer: Self-pay

## 2016-04-25 ENCOUNTER — Telehealth: Payer: Self-pay | Admitting: Endocrinology

## 2016-04-25 MED ORDER — GLUCOSE BLOOD VI STRP: ORAL_STRIP | 3 refills | Status: DC

## 2016-04-25 NOTE — Telephone Encounter (Signed)
ordered

## 2016-04-25 NOTE — Telephone Encounter (Signed)
Patient need refill of ONETOUCH DELICA LANCETS [id number] MISC  She is out...  Daisy Dillon [id number] [phone]

## 2016-05-28 ENCOUNTER — Other Ambulatory Visit (INDEPENDENT_AMBULATORY_CARE_PROVIDER_SITE_OTHER): Payer: Medicare Other

## 2016-05-28 ENCOUNTER — Ambulatory Visit (INDEPENDENT_AMBULATORY_CARE_PROVIDER_SITE_OTHER): Payer: Medicare Other | Admitting: Internal Medicine

## 2016-05-28 ENCOUNTER — Encounter: Payer: Self-pay | Admitting: Internal Medicine

## 2016-05-28 VITALS — BP 130/80 | HR 86 | Temp 98.4°F | Resp 20 | Wt 190.0 lb

## 2016-05-28 DIAGNOSIS — I10 Essential (primary) hypertension: Secondary | ICD-10-CM | POA: Diagnosis not present

## 2016-05-28 DIAGNOSIS — Z0000 Encounter for general adult medical examination without abnormal findings: Secondary | ICD-10-CM | POA: Diagnosis not present

## 2016-05-28 DIAGNOSIS — E119 Type 2 diabetes mellitus without complications: Secondary | ICD-10-CM

## 2016-05-28 DIAGNOSIS — E785 Hyperlipidemia, unspecified: Secondary | ICD-10-CM

## 2016-05-28 LAB — URINALYSIS, ROUTINE W REFLEX MICROSCOPIC
Bilirubin Urine: NEGATIVE
Hgb urine dipstick: NEGATIVE
Leukocytes, UA: NEGATIVE
Nitrite: NEGATIVE
Specific Gravity, Urine: >=1.03 — AB (ref 1.000–1.030)
Total Protein, Urine: NEGATIVE
Urine Glucose: NEGATIVE
Urobilinogen, UA: 0.2 (ref 0.0–1.0)
pH: 5.5 (ref 5.0–8.0)

## 2016-05-28 LAB — BASIC METABOLIC PANEL
BUN: 17 mg/dL (ref 6–23)
CO2: 27 mEq/L (ref 19–32)
Calcium: 9.7 mg/dL (ref 8.4–10.5)
Chloride: 106 mEq/L (ref 96–112)
Creatinine, Ser: 1.11 mg/dL (ref 0.40–1.20)
GFR: 62.26 mL/min (ref >60.00)
Glucose, Bld: 100 mg/dL — ABNORMAL HIGH (ref 70–99)
Potassium: 3.7 mEq/L (ref 3.5–5.1)
Sodium: 141 mEq/L (ref 135–145)

## 2016-05-28 LAB — CBC WITH DIFFERENTIAL/PLATELET
Basophils Absolute: 0 10*3/uL (ref 0.0–0.1)
Basophils Relative: 1 % (ref 0.0–3.0)
Eosinophils Absolute: 0 10*3/uL (ref 0.0–0.7)
Eosinophils Relative: 0.8 % (ref 0.0–5.0)
HCT: 39.5 % (ref 36.0–46.0)
Hemoglobin: 13 g/dL (ref 12.0–15.0)
Lymphocytes Relative: 37.2 % (ref 12.0–46.0)
Lymphs Abs: 1.4 10*3/uL (ref 0.7–4.0)
MCHC: 33 g/dL (ref 30.0–36.0)
MCV: 84.6 fl (ref 78.0–100.0)
Monocytes Absolute: 0.3 10*3/uL (ref 0.1–1.0)
Monocytes Relative: 8.4 % (ref 3.0–12.0)
Neutro Abs: 2 10*3/uL (ref 1.4–7.7)
Neutrophils Relative %: 52.6 % (ref 43.0–77.0)
Platelets: 232 10*3/uL (ref 150.0–400.0)
RBC: 4.67 Mil/uL (ref 3.87–5.11)
RDW: 13.9 % (ref 11.5–15.5)
WBC: 3.7 10*3/uL — ABNORMAL LOW (ref 4.0–10.5)

## 2016-05-28 LAB — HEPATIC FUNCTION PANEL
ALT: 31 U/L (ref 0–35)
AST: 24 U/L (ref 0–37)
Albumin: 4.2 g/dL (ref 3.5–5.2)
Alkaline Phosphatase: 102 U/L (ref 39–117)
Bilirubin, Direct: 0.1 mg/dL (ref 0.0–0.3)
Total Bilirubin: 0.3 mg/dL (ref 0.2–1.2)
Total Protein: 7.9 g/dL (ref 6.0–8.3)

## 2016-05-28 LAB — MICROALBUMIN / CREATININE URINE RATIO
Creatinine,U: 604 mg/dL
Microalb Creat Ratio: 0.3 mg/g (ref 0.0–30.0)
Microalb, Ur: 1.9 mg/dL (ref 0.0–1.9)

## 2016-05-28 LAB — LIPID PANEL
Cholesterol: 133 mg/dL (ref 0–200)
HDL: 57.4 mg/dL (ref >39.00)
LDL Cholesterol: 59 mg/dL (ref 0–99)
NonHDL: 75.85
Total CHOL/HDL Ratio: 2
Triglycerides: 85 mg/dL (ref 0.0–149.0)
VLDL: 17 mg/dL (ref 0.0–40.0)

## 2016-05-28 LAB — TSH: TSH: 1.42 u[IU]/mL (ref 0.35–4.50)

## 2016-05-28 LAB — HEMOGLOBIN A1C: Hgb A1c MFr Bld: 5.7 % (ref 4.6–6.5)

## 2016-05-28 NOTE — Progress Notes (Signed)
Pre visit review using our clinic review tool, if applicable. No additional management support is needed unless otherwise documented below in the visit note. 

## 2016-05-28 NOTE — Progress Notes (Signed)
Subjective:    Patient ID: Daisy Dillon, female    DOB: Oct 28, 1945, 70 y.o.   MRN: [id number]  HPI  Here for wellness and f/u;  Overall doing ok;  Pt denies Chest pain, worsening SOB, DOE, wheezing, orthopnea, PND, worsening LE edema, palpitations, dizziness or syncope.  Pt denies neurological change such as new headache, facial or extremity weakness.  Pt denies polydipsia, polyuria, or low sugar symptoms. Pt states overall good compliance with treatment and medications, good tolerability, and has been trying to follow appropriate diet.  Pt denies worsening depressive symptoms, suicidal ideation or panic. No fever, night sweats, wt loss, loss of appetite, or other constitutional symptoms.  Pt states good ability with ADL's, has low fall risk, home safety reviewed and adequate, no other significant changes in hearing or vision, and only occasionally active with exercise. No other changes to history except: .Pt continues to have recurring LBP without change in severity, bowel or bladder change, fever, wt loss,  worsening LE pain/numbness/weakness, gait change or falls.  Seeing several providers with different recommendations ranging from surgury to nerve ablation. S/p nerve ablation recent but no improvement so far Plans to see optho this month. Sees Dr Dwyane Dee for DM  Past Medical History:  Diagnosis Date  . Diabetes mellitus   . Diverticulosis of colon (without mention of hemorrhage)   . Elevated cholesterol   . Fibroid   . Gastritis   . GERD (gastroesophageal reflux disease)   . Hiatal hernia   . Hypertension   . IBS (irritable bowel syndrome)   . Positive ANA (antinuclear antibody) 04/23/2012  . Stricture and stenosis of esophagus 09/03/2005  . Urinary incontinence    Past Surgical History:  Procedure Laterality Date  . ABDOMINAL HYSTERECTOMY  1985  . CHOLECYSTECTOMY    . COLONOSCOPY  12/15/2007   diverticulosis  . ESOPHAGOGASTRODUODENOSCOPY  12/24/2012   normal   . KNEE SURGERY  Right    arthroscopic    reports that she has never smoked. She has never used smokeless tobacco. She reports that she does not drink alcohol or use drugs. family history includes Bipolar disorder in her brother; Breast cancer in her maternal aunt; Diabetes in her brother and mother; Hypertension in her mother; Stroke in her brother and brother. Allergies  Allergen Reactions  . Codeine Phosphate     REACTION: unspecified, nausea, vomiting   Current Outpatient Prescriptions on File Prior to Visit  Medication Sig Dispense Refill  . acetaminophen (TYLENOL) 650 MG CR tablet Take 650 mg by mouth every 8 (eight) hours as needed for pain. Reported on 11/08/2015    . aspirin 81 MG tablet Take 81 mg by mouth daily.      . bisoprolol-hydrochlorothiazide (ZIAC) 2.5-6.25 MG per tablet Take 1 tablet by mouth daily. 90 tablet 1  . bisoprolol-hydrochlorothiazide (ZIAC) 2.5-6.25 MG tablet TAKE ONE TABLET BY MOUTH ONCE DAILY 30 tablet 1  . Calcium Carbonate-Vitamin D (CALCIUM + D PO) Take by mouth.      . Cholecalciferol (VITAMIN D3) 1000 UNITS CAPS Take 1 capsule by mouth daily.      Marland Kitchen dexlansoprazole (DEXILANT) 60 MG capsule Take 1 capsule (60 mg total) by mouth daily. 90 capsule 3  . dicyclomine (BENTYL) 10 MG capsule Take 1 capsule (10 mg total) by mouth 4 (four) times daily -  before meals and at bedtime. 360 capsule 3  . EAR WAX REMOVAL DROPS 6.5 % otic solution     . glucose blood (ONE TOUCH ULTRA  TEST) test strip USE AS DIRECTED EVERY DAY Dx code E11.9 50 each 3  . losartan (COZAAR) 50 MG tablet Take 1 tablet by mouth  daily 90 tablet 1  . metFORMIN (GLUCOPHAGE) 500 MG tablet Take 1 tablet by mouth two  times daily with meals 180 tablet 1  . Multiple Vitamin (MULTIVITAMIN) tablet Take 1 tablet by mouth daily.      Glory Rosebush DELICA LANCETS 99991111 MISC Use to check blood sugar 2 times a week .Dx code E11.9 100 each 1  . pioglitazone (ACTOS) 15 MG tablet TAKE 1 TABLET BY MOUTH  DAILY 90 tablet 2  .  pravastatin (PRAVACHOL) 20 MG tablet Take 1 tablet by mouth  daily 90 tablet 1  . traMADol (ULTRAM) 50 MG tablet TAKE ONE TABLET BY MOUTH EVERY 8 HOURS AS NEEDED 120 tablet 0  . verapamil (CALAN-SR) 240 MG CR tablet Take 1 tablet (240 mg total) by mouth at bedtime. 90 tablet 0   No current facility-administered medications on file prior to visit.     Review of Systems Constitutional: Negative for increased diaphoresis, or other activity, appetite or siginficant weight change other than noted HENT: Negative for worsening hearing loss, ear pain, facial swelling, mouth sores and neck stiffness.   Eyes: Negative for other worsening pain, redness or visual disturbance.  Respiratory: Negative for choking or stridor Cardiovascular: Negative for other chest pain and palpitations.  Gastrointestinal: Negative for worsening diarrhea, blood in stool, or abdominal distention Genitourinary: Negative for hematuria, flank pain or change in urine volume.  Musculoskeletal: Negative for myalgias or other joint complaints.  Skin: Negative for other color change and wound or drainage.  Neurological: Negative for syncope and numbness. other than noted Hematological: Negative for adenopathy. or other swelling Psychiatric/Behavioral: Negative for hallucinations, SI, self-injury, decreased concentration or other worsening agitation.  All other system neg per pt    Objective:   Physical Exam BP 130/80   Pulse 86   Temp 98.4 F (36.9 C) (Oral)   Resp 20   Wt 190 lb (86.2 kg)   SpO2 96%   BMI 31.14 kg/m  VS noted,  Constitutional: Pt is oriented to person, place, and time. Appears well-developed and well-nourished, in no significant distress Head: Normocephalic and atraumatic  Eyes: Conjunctivae and EOM are normal. Pupils are equal, round, and reactive to light Right Ear: External ear normal.  Left Ear: External ear normal Nose: Nose normal.  Mouth/Throat: Oropharynx is clear and moist  Neck: Normal  range of motion. Neck supple. No JVD present. No tracheal deviation present or significant neck LA or mass Cardiovascular: Normal rate, regular rhythm, normal heart sounds and intact distal pulses.   Pulmonary/Chest: Effort normal and breath sounds without rales or wheezing  Abdominal: Soft. Bowel sounds are normal. NT. No HSM  Musculoskeletal: Normal range of motion. Exhibits no edema Lymphadenopathy: Has no cervical adenopathy.  Neurological: Pt is alert and oriented to person, place, and time. Pt has normal reflexes. No cranial nerve deficit. Motor grossly intact Skin: Skin is warm and dry. No rash noted or new ulcers Psychiatric:  Has normal mood and affect. Behavior is normal.  No other new exam findings    Assessment & Plan:

## 2016-05-28 NOTE — Patient Instructions (Signed)

## 2016-06-01 NOTE — Assessment & Plan Note (Signed)
stable overall by history and exam, recent data reviewed with pt, and pt to continue medical treatment as before,  to f/u any worsening symptoms or concerns Lab Results  Component Value Date   HGBA1C 5.7 05/28/2016

## 2016-06-01 NOTE — Assessment & Plan Note (Signed)

## 2016-06-01 NOTE — Assessment & Plan Note (Signed)
stable overall by history and exam, recent data reviewed with pt, and pt to continue medical treatment as before,  to f/u any worsening symptoms or concerns BP Readings from Last 3 Encounters:  05/28/16 130/80  04/22/16 130/82  03/06/16 140/80

## 2016-06-01 NOTE — Assessment & Plan Note (Signed)
stable overall by history and exam, recent data reviewed with pt, and pt to continue medical treatment as before,  to f/u any worsening symptoms or concerns Lab Results  Component Value Date   LDLCALC 59 05/28/2016

## 2016-06-03 ENCOUNTER — Other Ambulatory Visit: Payer: Self-pay | Admitting: Endocrinology

## 2016-06-04 DIAGNOSIS — E119 Type 2 diabetes mellitus without complications: Secondary | ICD-10-CM | POA: Diagnosis not present

## 2016-06-20 ENCOUNTER — Other Ambulatory Visit: Payer: Self-pay | Admitting: Endocrinology

## 2016-06-23 ENCOUNTER — Other Ambulatory Visit: Payer: Self-pay | Admitting: Endocrinology

## 2016-07-09 ENCOUNTER — Other Ambulatory Visit: Payer: Self-pay

## 2016-07-09 ENCOUNTER — Other Ambulatory Visit: Payer: Self-pay | Admitting: Endocrinology

## 2016-07-09 ENCOUNTER — Telehealth: Payer: Self-pay | Admitting: Endocrinology

## 2016-07-09 MED ORDER — GLUCOSE BLOOD VI STRP: ORAL_STRIP | 3 refills | Status: DC

## 2016-07-09 NOTE — Telephone Encounter (Signed)
Pt needs her test strips sent to the [facility] on [address] [place].

## 2016-07-09 NOTE — Telephone Encounter (Signed)
Ordered 07/09/16

## 2016-07-10 ENCOUNTER — Telehealth: Payer: Self-pay | Admitting: Endocrinology

## 2016-07-10 NOTE — Telephone Encounter (Signed)
Pt is changing her request for the test strips rx to be sent to [facility]

## 2016-07-10 NOTE — Telephone Encounter (Signed)
Pt called and said that [facility] does not have her test strips, could you please resubmit that prescription?

## 2016-07-11 ENCOUNTER — Other Ambulatory Visit: Payer: Self-pay

## 2016-07-11 MED ORDER — GLUCOSE BLOOD VI STRP: ORAL_STRIP | 3 refills | Status: DC

## 2016-07-11 NOTE — Telephone Encounter (Signed)
Ordered 07/11/16 

## 2016-07-22 ENCOUNTER — Other Ambulatory Visit: Payer: Self-pay | Admitting: Endocrinology

## 2016-08-25 ENCOUNTER — Other Ambulatory Visit: Payer: Self-pay

## 2016-08-25 MED ORDER — BISOPROLOL-HYDROCHLOROTHIAZIDE 2.5-6.25 MG PO TABS: 1.0000 | ORAL_TABLET | Freq: Every day | ORAL | 1 refills | Status: DC

## 2016-08-27 ENCOUNTER — Telehealth: Payer: Self-pay

## 2016-08-27 NOTE — Telephone Encounter (Signed)
Informed patient that we received the approval for patient for [facility] patient assistance for Dexilant. Case number [id number] Also informed patient that I am mailing her personal info and forms back to her home address.

## 2016-08-28 DIAGNOSIS — M48062 Spinal stenosis, lumbar region with neurogenic claudication: Secondary | ICD-10-CM | POA: Diagnosis not present

## 2016-08-28 DIAGNOSIS — M545 Low back pain: Secondary | ICD-10-CM | POA: Diagnosis not present

## 2016-08-28 DIAGNOSIS — M4316 Spondylolisthesis, lumbar region: Secondary | ICD-10-CM | POA: Diagnosis not present

## 2016-08-28 DIAGNOSIS — M47896 Other spondylosis, lumbar region: Secondary | ICD-10-CM | POA: Diagnosis not present

## 2016-08-29 ENCOUNTER — Other Ambulatory Visit (INDEPENDENT_AMBULATORY_CARE_PROVIDER_SITE_OTHER): Payer: Medicare Other

## 2016-08-29 DIAGNOSIS — E119 Type 2 diabetes mellitus without complications: Secondary | ICD-10-CM | POA: Diagnosis not present

## 2016-08-29 LAB — COMPREHENSIVE METABOLIC PANEL
ALT: 27 U/L (ref 0–35)
AST: 29 U/L (ref 0–37)
Albumin: 4 g/dL (ref 3.5–5.2)
Alkaline Phosphatase: 98 U/L (ref 39–117)
BUN: 18 mg/dL (ref 6–23)
CO2: 27 mEq/L (ref 19–32)
Calcium: 9.7 mg/dL (ref 8.4–10.5)
Chloride: 104 mEq/L (ref 96–112)
Creatinine, Ser: 1.1 mg/dL (ref 0.40–1.20)
GFR: 62.86 mL/min (ref >60.00)
Glucose, Bld: 90 mg/dL (ref 70–99)
Potassium: 3.5 mEq/L (ref 3.5–5.1)
Sodium: 138 mEq/L (ref 135–145)
Total Bilirubin: 0.3 mg/dL (ref 0.2–1.2)
Total Protein: 7.7 g/dL (ref 6.0–8.3)

## 2016-08-29 LAB — URINALYSIS, ROUTINE W REFLEX MICROSCOPIC
Bilirubin Urine: NEGATIVE
Hgb urine dipstick: NEGATIVE
Ketones, ur: NEGATIVE
Leukocytes, UA: NEGATIVE
Nitrite: NEGATIVE
Specific Gravity, Urine: >=1.03 — AB (ref 1.000–1.030)
Total Protein, Urine: NEGATIVE
Urine Glucose: NEGATIVE
Urobilinogen, UA: 0.2 (ref 0.0–1.0)
pH: 5.5 (ref 5.0–8.0)

## 2016-08-29 LAB — MICROALBUMIN / CREATININE URINE RATIO
Creatinine,U: 295.2 mg/dL
Microalb Creat Ratio: 0.3 mg/g (ref 0.0–30.0)
Microalb, Ur: 0.9 mg/dL (ref 0.0–1.9)

## 2016-08-29 LAB — HEMOGLOBIN A1C: Hgb A1c MFr Bld: 5.8 % (ref 4.6–6.5)

## 2016-09-03 ENCOUNTER — Encounter: Payer: Self-pay | Admitting: Endocrinology

## 2016-09-03 ENCOUNTER — Ambulatory Visit (INDEPENDENT_AMBULATORY_CARE_PROVIDER_SITE_OTHER): Payer: Medicare Other | Admitting: Endocrinology

## 2016-09-03 VITALS — BP 122/72 | HR 86 | Temp 99.1°F | Ht 65.5 in | Wt 196.2 lb

## 2016-09-03 DIAGNOSIS — E119 Type 2 diabetes mellitus without complications: Secondary | ICD-10-CM | POA: Diagnosis not present

## 2016-09-03 NOTE — Progress Notes (Signed)
150/72  l

## 2016-09-03 NOTE — Progress Notes (Signed)
Patient ID: Daisy Dillon, female   DOB: Apr 11, 1946, 71 y.o.   MRN: [id number]   Reason for Appointment: follow-up   History of Present Illness   Diagnosis: Type 2 DIABETES MELITUS, date of diagnosis:   2002   She has had stable control of her mild diabetes with combination of low-doses of Actos and metformin She usually has had a normal A1c and this stays in the 5.7-5.8 range fairly consistently  She has checked her sugar periodically as directed including after meals, results below  She has gained back the weight she had lost last year She does not really have meal plan and has not seen a dietitian in several years She is not able to exercise because of low back pain and only occasionally doing chair exercises as discussed before  Monitors blood glucose: Less than once a day .    Glucometer: One touch ultra mini         Blood Glucose readings from download:   Range 90-1 60 at various times  with several paired readings before and after meals, has only one high reading over 150 at night after eating Overall median 115        Physical activity: exercise: Minimal because of lower back pain           Complications: are: No history of neuropathy     Wt Readings from Last 3 Encounters:  09/03/16 196 lb 3.2 oz (89 kg)  05/28/16 190 lb (86.2 kg)  04/22/16 198 lb (89.8 kg)    Lab Results  Component Value Date   HGBA1C 5.8 08/29/2016   HGBA1C 5.7 05/28/2016   HGBA1C 5.7 03/04/2016   Lab Results  Component Value Date   MICROALBUR 0.9 08/29/2016   LDLCALC 59 05/28/2016   CREATININE 1.10 08/29/2016    OTHER active problems: See review of systems   Lab on 08/29/2016  Component Date Value Ref Range Status  . Hgb A1c MFr Bld 08/29/2016 5.8  4.6 - 6.5 % Final  . Sodium 08/29/2016 138  135 - 145 mEq/L Final  . Potassium 08/29/2016 3.5  3.5 - 5.1 mEq/L Final  . Chloride 08/29/2016 104  96 - 112 mEq/L Final  . CO2 08/29/2016 27  19 - 32 mEq/L Final  . Glucose, Bld  08/29/2016 90  70 - 99 mg/dL Final  . BUN 08/29/2016 18  6 - 23 mg/dL Final  . Creatinine, Ser 08/29/2016 1.10  0.40 - 1.20 mg/dL Final  . Total Bilirubin 08/29/2016 0.3  0.2 - 1.2 mg/dL Final  . Alkaline Phosphatase 08/29/2016 98  39 - 117 U/L Final  . AST 08/29/2016 29  0 - 37 U/L Final  . ALT 08/29/2016 27  0 - 35 U/L Final  . Total Protein 08/29/2016 7.7  6.0 - 8.3 g/dL Final  . Albumin 08/29/2016 4.0  3.5 - 5.2 g/dL Final  . Calcium 08/29/2016 9.7  8.4 - 10.5 mg/dL Final  . GFR 08/29/2016 62.86  >60.00 mL/min Final  . Microalb, Ur 08/29/2016 0.9  0.0 - 1.9 mg/dL Final  . Creatinine,U 08/29/2016 295.2  mg/dL Final  . Microalb Creat Ratio 08/29/2016 0.3  0.0 - 30.0 mg/g Final  . Color, Urine 08/29/2016 YELLOW  Yellow;Lt. Yellow Final  . APPearance 08/29/2016 CLEAR  Clear Final  . Specific Gravity, Urine 08/29/2016 >=1.030* 1.000 - 1.030 Final  . pH 08/29/2016 5.5  5.0 - 8.0 Final  . Total Protein, Urine 08/29/2016 NEGATIVE  Negative Final  .  Urine Glucose 08/29/2016 NEGATIVE  Negative Final  . Ketones, ur 08/29/2016 NEGATIVE  Negative Final  . Bilirubin Urine 08/29/2016 NEGATIVE  Negative Final  . Hgb urine dipstick 08/29/2016 NEGATIVE  Negative Final  . Urobilinogen, UA 08/29/2016 0.2  0.0 - 1.0 Final  . Leukocytes, UA 08/29/2016 NEGATIVE  Negative Final  . Nitrite 08/29/2016 NEGATIVE  Negative Final  . WBC, UA 08/29/2016 0-2/hpf  0-2/hpf Final  . RBC / HPF 08/29/2016 0-2/hpf  0-2/hpf Final  . Squamous Epithelial / LPF 08/29/2016 Few(5-10/hpf)* Rare(0-4/hpf) Final    Allergies as of 09/03/2016      Reactions   Codeine Phosphate    REACTION: unspecified, nausea, vomiting      Medication List       Accurate as of 09/03/16  1:17 PM. Always use your most recent med list.          acetaminophen 650 MG CR tablet Commonly known as:  TYLENOL Take 650 mg by mouth every 8 (eight) hours as needed for pain. Reported on 11/08/2015   aspirin 81 MG tablet Take 81 mg by mouth  daily.   bisoprolol-hydrochlorothiazide 2.5-6.25 MG tablet Commonly known as:  ZIAC TAKE ONE TABLET BY MOUTH ONCE DAILY   bisoprolol-hydrochlorothiazide 2.5-6.25 MG tablet Commonly known as:  ZIAC Take 1 tablet by mouth daily.   CALCIUM + D PO Take by mouth.   dexlansoprazole 60 MG capsule Commonly known as:  DEXILANT Take 1 capsule (60 mg total) by mouth daily.   dicyclomine 10 MG capsule Commonly known as:  BENTYL Take 1 capsule (10 mg total) by mouth 4 (four) times daily -  before meals and at bedtime.   EAR WAX REMOVAL DROPS 6.5 % otic solution Generic drug:  carbamide peroxide   glucose blood test strip Commonly known as:  ONE TOUCH ULTRA TEST USE AS DIRECTED EVERY DAY Dx code E11.9   losartan 50 MG tablet Commonly known as:  COZAAR TAKE 1 TABLET BY MOUTH  DAILY   metFORMIN 500 MG tablet Commonly known as:  GLUCOPHAGE TAKE 1 TABLET BY MOUTH TWO  TIMES DAILY WITH MEALS   multivitamin tablet Take 1 tablet by mouth daily.   ONETOUCH DELICA LANCETS 06T Misc Use to check blood sugar 2 times a week .Dx code E11.9   pioglitazone 15 MG tablet Commonly known as:  ACTOS TAKE 1 TABLET BY MOUTH  DAILY   pravastatin 20 MG tablet Commonly known as:  PRAVACHOL TAKE 1 TABLET BY MOUTH  DAILY   traMADol 50 MG tablet Commonly known as:  ULTRAM TAKE ONE TABLET BY MOUTH EVERY 8 HOURS AS NEEDED   verapamil 240 MG CR tablet Commonly known as:  CALAN-SR TAKE 1 TABLET BY MOUTH AT  BEDTIME   Vitamin D3 1000 units Caps Take 1 capsule by mouth daily.       Allergies:  Allergies  Allergen Reactions  . Codeine Phosphate     REACTION: unspecified, nausea, vomiting    Past Medical History:  Diagnosis Date  . Diabetes mellitus   . Diverticulosis of colon (without mention of hemorrhage)   . Elevated cholesterol   . Fibroid   . Gastritis   . GERD (gastroesophageal reflux disease)   . Hiatal hernia   . Hypertension   . IBS (irritable bowel syndrome)   . Positive ANA  (antinuclear antibody) 04/23/2012  . Stricture and stenosis of esophagus 09/03/2005  . Urinary incontinence     Past Surgical History:  Procedure Laterality Date  . ABDOMINAL HYSTERECTOMY  [place]    . COLONOSCOPY  12/15/2007   diverticulosis  . ESOPHAGOGASTRODUODENOSCOPY  12/24/2012   normal   . KNEE SURGERY Right    arthroscopic    Family History  Problem Relation Age of Onset  . Diabetes Mother   . Hypertension Mother   . Stroke Brother     x 2  . Bipolar disorder Brother   . Stroke Brother   . Diabetes Brother   . Breast cancer Maternal Aunt     Age 62's  . Colon cancer Neg Hx   . Stomach cancer Neg Hx   . Throat cancer Neg Hx   . Liver disease Neg Hx     Social History:  reports that she has never smoked. She has never used smokeless tobacco. She reports that she does not drink alcohol or use drugs.  Review of Systems:  HYPERTENSION:   She takes losartan 50 mg, Verapamil 240 mg and Ziac. Blood pressure is consistently controlled Was previously having hypokalemia with Dyazide Does not remember her recent blood pressure reading at home  BP Readings from Last 3 Encounters:  09/03/16 122/72  05/28/16 130/80  04/22/16 130/82   Renal functions:  Lab Results  Component Value Date   CREATININE 1.10 08/29/2016   BUN 18 08/29/2016   NA 138 08/29/2016   K 3.5 08/29/2016   CL 104 08/29/2016   CO2 27 08/29/2016     HYPERLIPIDEMIA: The lipid abnormality consists of elevated LDL which is well-controlled on pravastatin 20 mg  Lab Results  Component Value Date   CHOL 133 05/28/2016   HDL 57.40 05/28/2016   LDLCALC 59 05/28/2016   TRIG 85.0 05/28/2016   CHOLHDL 2 05/28/2016     Eye examLast: 12/16      Examination:   BP 122/72 (Cuff Size: Large)   Pulse 86   Temp 99.1 F (37.3 C) (Oral)   Ht 5' 5.5" (1.664 m)   Wt 196 lb 3.2 oz (89 kg)   SpO2 98%   BMI 32.15 kg/m   Body mass index is 32.15 kg/m.   Diabetic Foot Exam - Simple    Simple Foot Form Diabetic Foot exam was performed with the following findings:  Yes 09/03/2016 10:40 AM  Visual Inspection No deformities, no ulcerations, no other skin breakdown bilaterally:  Yes Sensation Testing Intact to touch and monofilament testing bilaterally:  Yes Pulse Check Posterior Tibialis and Dorsalis pulse intact bilaterally:  Yes Comments    No pedal edema  ASSESSMENT/ PLAN:   Diabetes type 2 with  obesity and BMI 32 She is on metformin 1000 mg a day and  15 mg Actos She has been taking these medications with stable control over long-term  Again her A1c is in the normal range She has fairly good blood sugars at home also  Discussed doing chair exercises that she is starting to gain some weight Also she can see the dietitian for review of diet again   Hypertension:  well controlled with Ziac, verapamil and losartan 50 mg, followed by PCP .  Normal urine microalbumin  Will need to continue monitoring potassium which is borderline   Follow-up in 6 months again   [facility] 09/03/2016, 1:17 PM

## 2016-09-03 NOTE — Patient Instructions (Signed)
Chair exercises daily

## 2016-10-02 ENCOUNTER — Encounter: Payer: Self-pay | Admitting: Dietician

## 2016-10-02 ENCOUNTER — Other Ambulatory Visit: Payer: Self-pay | Admitting: Endocrinology

## 2016-10-02 ENCOUNTER — Encounter: Payer: Medicare Other | Attending: Endocrinology | Admitting: Dietician

## 2016-10-02 DIAGNOSIS — Z6832 Body mass index (BMI) 32.0-32.9, adult: Secondary | ICD-10-CM | POA: Insufficient documentation

## 2016-10-02 DIAGNOSIS — E119 Type 2 diabetes mellitus without complications: Secondary | ICD-10-CM | POA: Diagnosis not present

## 2016-10-02 DIAGNOSIS — Z713 Dietary counseling and surveillance: Secondary | ICD-10-CM | POA: Diagnosis not present

## 2016-10-02 NOTE — Patient Instructions (Addendum)
Consider adding a small portion of nuts to your oatmeal. Rethink what you drink. Continue to read labels for carbohydrate, portion size, sodium and fat/saturated fat. Continue to watch your fat and sodium intake as well as added sugar.   Aim for 2-3 Carb Choices per meal (30-45 grams) +/- 1 either way  Aim for 0-1 Carbs per snack if hungry  Include protein in moderation with your meals and snacks Consider reading food labels for Total Carbohydrate and Fat Grams of foods Consider  increasing your activity level by walking for 5 minutes daily as tolerated and increase as tolerated or do arm chair exercises twice per day. Cotinue checking BG at alternate times per day as directed by MD  Consider taking medication as directed by MD

## 2016-10-03 DIAGNOSIS — M47896 Other spondylosis, lumbar region: Secondary | ICD-10-CM | POA: Diagnosis not present

## 2016-10-03 DIAGNOSIS — M48062 Spinal stenosis, lumbar region with neurogenic claudication: Secondary | ICD-10-CM | POA: Diagnosis not present

## 2016-10-03 DIAGNOSIS — M545 Low back pain: Secondary | ICD-10-CM | POA: Diagnosis not present

## 2016-10-03 DIAGNOSIS — M4316 Spondylolisthesis, lumbar region: Secondary | ICD-10-CM | POA: Diagnosis not present

## 2016-10-03 NOTE — Progress Notes (Signed)
Diabetes Self-Management Education  Visit Type: First/Initial  Appt. Start Time: 0086 Appt. End Time: 1440  10/03/2016  [name], identified by name and date of birth, is a 71 y.o. female with a diagnosis of Diabetes: Type 2. Other hx includes HTN, Hyperlipidemia, GERD, IBS, and back problems. Medication includes Metformin and Actose.  Patient lives alone.  She is a retired Medical illustrator.  ASSESSMENT  Height 5\' 5"  (1.651 m), weight 196 lb (88.9 kg). Body mass index is 32.62 kg/m.  Lowest adult weight 150 lbs, Highest 198 lbs.  UBW 189-192.      Diabetes Self-Management Education - 10/02/16 1315      Visit Information   Visit Type First/Initial     Initial Visit   Diabetes Type Type 2   Are you currently following a meal plan? No   Are you taking your medications as prescribed? Yes   Date Diagnosed 2002     Health Coping   How would you rate your overall health? Excellent     Psychosocial Assessment   Patient Belief/Attitude about Diabetes Motivated to manage diabetes   Self-care barriers None   Self-management support Doctor's office   Other persons present Patient   Patient Concerns Nutrition/Meal planning   Special Needs None   Preferred Learning Style No preference indicated   Learning Readiness Ready   How often do you need to have someone help you when you read instructions, pamphlets, or other written materials from your doctor or pharmacy? 1 - Never   What is the last grade level you completed in school? 2 years college     Pre-Education Assessment   Patient understands the diabetes disease and treatment process. Needs Review   Patient understands incorporating nutritional management into lifestyle. Needs Review   Patient undertands incorporating physical activity into lifestyle. Needs Review   Patient understands using medications safely. Needs Review   Patient understands monitoring blood glucose, interpreting and using results Needs Review   Patient understands prevention, detection, and treatment of acute complications. Needs Review   Patient understands prevention, detection, and treatment of chronic complications. Needs Review   Patient understands how to develop strategies to address psychosocial issues. Needs Review   Patient understands how to develop strategies to promote health/change behavior. Needs Review     Complications   Last HgB A1C per patient/outside source 5.8 %  08/29/16   How often do you check your blood sugar? 3-4 times / week  2 times per week   Fasting Blood glucose range (mg/dL) 70-129   Postprandial Blood glucose range (mg/dL) 130-179;70-129   Number of hypoglycemic episodes per month 0   Number of hyperglycemic episodes per week 0   Have you had a dilated eye exam in the past 12 months? Yes   Have you had a dental exam in the past 12 months? Yes   Are you checking your feet? Yes   How many days per week are you checking your feet? 7     Dietary Intake   Breakfast [place] with cinnamon and 1/2 banana, honey OR Mandarine Orange cup or grapefruit and combination of multi grain cheerios and honey nut cheerios OR yogurt with or without fruit OR egg, 2 slices bacon, 1 English Muffin   Lunch non starchy vegetables, lima beans, baked fish or chicken or small amount of beef steak occasionally OR salad with chicken   Snack (afternoon) fruit OR smoothie (1 cup frozen fruit, 1/2 banana, sometimes spinach, 2% milk or juice)  Dinner simular to lunch   Snack (evening) fruit occasionally   Beverage(s) smoohtie occasionally, juice, water,  sweet tea with lemonade, occcasional decaf coffee with small amount of milk and 2 tsp sugar     Exercise   Exercise Type ADL's  secondary to back problems   How many days per week to you exercise? 0   How many minutes per day do you exercise? 0   Total minutes per week of exercise 0     Patient Education   Previous Diabetes Education Yes (please comment)  a while ago    Disease state  Definition of diabetes, type 1 and 2, and the diagnosis of diabetes   Nutrition management  Role of diet in the treatment of diabetes and the relationship between the three main macronutrients and blood glucose level;Food label reading, portion sizes and measuring food.;Carbohydrate counting;Information on hints to eating out and maintain blood glucose control.;Meal options for control of blood glucose level and chronic complications.   Physical activity and exercise  Role of exercise on diabetes management, blood pressure control and cardiac health.   Medications Reviewed patients medication for diabetes, action, purpose, timing of dose and side effects.   Monitoring Purpose and frequency of SMBG.;Identified appropriate SMBG and/or A1C goals.;Daily foot exams   Acute complications Taught treatment of hypoglycemia - the 15 rule.   Chronic complications Relationship between chronic complications and blood glucose control   Psychosocial adjustment Worked with patient to identify barriers to care and solutions;Role of stress on diabetes     Individualized Goals (developed by patient)   Nutrition General guidelines for healthy choices and portions discussed   Physical Activity Exercise 3-5 times per week;15 minutes per day   Monitoring  test my blood glucose as discussed   Problem Solving beverages   Reducing Risk examine blood glucose patterns   Health Coping discuss diabetes with (comment)  MD/RD     Post-Education Assessment   Patient understands the diabetes disease and treatment process. Demonstrates understanding / competency   Patient understands incorporating nutritional management into lifestyle. Demonstrates understanding / competency   Patient undertands incorporating physical activity into lifestyle. Demonstrates understanding / competency   Patient understands using medications safely. Demonstrates understanding / competency   Patient understands monitoring blood  glucose, interpreting and using results Demonstrates understanding / competency   Patient understands prevention, detection, and treatment of acute complications. Demonstrates understanding / competency   Patient understands prevention, detection, and treatment of chronic complications. Demonstrates understanding / competency   Patient understands how to develop strategies to address psychosocial issues. Demonstrates understanding / competency   Patient understands how to develop strategies to promote health/change behavior. Demonstrates understanding / competency     Outcomes   Expected Outcomes Demonstrated interest in learning. Expect positive outcomes   Future DMSE PRN   Program Status Completed      Individualized Plan for Diabetes Self-Management Training:   Learning Objective:  Patient will have a greater understanding of diabetes self-management. Patient education plan is to attend individual and/or group sessions per assessed needs and concerns.   Plan:   Patient Instructions  Consider adding a small portion of nuts to your oatmeal. Rethink what you drink. Continue to read labels for carbohydrate, portion size, sodium and fat/saturated fat. Continue to watch your fat and sodium intake as well as added sugar.   Aim for 2-3 Carb Choices per meal (30-45 grams) +/- 1 either way  Aim for 0-1 Carbs per snack if hungry  Include  protein in moderation with your meals and snacks Consider reading food labels for Total Carbohydrate and Fat Grams of foods Consider  increasing your activity level by walking for 5 minutes daily as tolerated and increase as tolerated or do arm chair exercises twice per day. Cotinue checking BG at alternate times per day as directed by MD  Consider taking medication as directed by MD       Expected Outcomes:  Demonstrated interest in learning. Expect positive outcomes  Education material provided: Living Well with Diabetes, Food label handouts, A1C  conversion sheet, Meal plan card, My Plate and Snack sheet  If problems or questions, patient to contact team via:  Phone  Future DSME appointment: PRN

## 2016-10-22 ENCOUNTER — Other Ambulatory Visit: Payer: Self-pay | Admitting: Endocrinology

## 2016-10-29 ENCOUNTER — Encounter: Payer: Self-pay | Admitting: Gynecology

## 2016-10-30 DIAGNOSIS — M48062 Spinal stenosis, lumbar region with neurogenic claudication: Secondary | ICD-10-CM | POA: Diagnosis not present

## 2016-10-30 DIAGNOSIS — M4316 Spondylolisthesis, lumbar region: Secondary | ICD-10-CM | POA: Diagnosis not present

## 2016-11-04 ENCOUNTER — Other Ambulatory Visit: Payer: Self-pay | Admitting: Orthopaedic Surgery

## 2016-11-04 DIAGNOSIS — M4716 Other spondylosis with myelopathy, lumbar region: Secondary | ICD-10-CM

## 2016-11-14 HISTORY — PX: BACK SURGERY: SHX140

## 2016-11-19 ENCOUNTER — Ambulatory Visit
Admission: RE | Admit: 2016-11-19 | Discharge: 2016-11-19 | Disposition: A | Discharge status: Home / Self Care | Payer: Medicare Other | Source: Ambulatory Visit | Attending: Orthopaedic Surgery | Admitting: Orthopaedic Surgery

## 2016-11-19 DIAGNOSIS — M4716 Other spondylosis with myelopathy, lumbar region: Secondary | ICD-10-CM

## 2016-11-19 DIAGNOSIS — M48061 Spinal stenosis, lumbar region without neurogenic claudication: Secondary | ICD-10-CM | POA: Diagnosis not present

## 2016-11-21 DIAGNOSIS — M4716 Other spondylosis with myelopathy, lumbar region: Secondary | ICD-10-CM | POA: Diagnosis not present

## 2016-11-21 DIAGNOSIS — M48062 Spinal stenosis, lumbar region with neurogenic claudication: Secondary | ICD-10-CM | POA: Diagnosis not present

## 2016-11-21 DIAGNOSIS — M5137 Other intervertebral disc degeneration, lumbosacral region: Secondary | ICD-10-CM | POA: Diagnosis not present

## 2016-11-21 DIAGNOSIS — M545 Low back pain: Secondary | ICD-10-CM | POA: Diagnosis not present

## 2016-11-26 DIAGNOSIS — Z01818 Encounter for other preprocedural examination: Secondary | ICD-10-CM | POA: Diagnosis not present

## 2016-11-26 DIAGNOSIS — M4316 Spondylolisthesis, lumbar region: Secondary | ICD-10-CM | POA: Diagnosis not present

## 2016-12-02 DIAGNOSIS — M5137 Other intervertebral disc degeneration, lumbosacral region: Secondary | ICD-10-CM | POA: Diagnosis not present

## 2016-12-02 DIAGNOSIS — E119 Type 2 diabetes mellitus without complications: Secondary | ICD-10-CM | POA: Diagnosis not present

## 2016-12-02 DIAGNOSIS — M48062 Spinal stenosis, lumbar region with neurogenic claudication: Secondary | ICD-10-CM | POA: Diagnosis not present

## 2016-12-02 DIAGNOSIS — K219 Gastro-esophageal reflux disease without esophagitis: Secondary | ICD-10-CM | POA: Diagnosis not present

## 2016-12-02 DIAGNOSIS — Z4789 Encounter for other orthopedic aftercare: Secondary | ICD-10-CM | POA: Diagnosis not present

## 2016-12-02 DIAGNOSIS — M4316 Spondylolisthesis, lumbar region: Secondary | ICD-10-CM | POA: Diagnosis not present

## 2016-12-02 DIAGNOSIS — M48061 Spinal stenosis, lumbar region without neurogenic claudication: Secondary | ICD-10-CM | POA: Diagnosis not present

## 2016-12-02 DIAGNOSIS — K589 Irritable bowel syndrome without diarrhea: Secondary | ICD-10-CM | POA: Diagnosis not present

## 2016-12-02 DIAGNOSIS — K5900 Constipation, unspecified: Secondary | ICD-10-CM | POA: Diagnosis not present

## 2016-12-02 DIAGNOSIS — M4327 Fusion of spine, lumbosacral region: Secondary | ICD-10-CM | POA: Diagnosis not present

## 2016-12-02 DIAGNOSIS — M961 Postlaminectomy syndrome, not elsewhere classified: Secondary | ICD-10-CM | POA: Diagnosis not present

## 2016-12-02 DIAGNOSIS — M4716 Other spondylosis with myelopathy, lumbar region: Secondary | ICD-10-CM | POA: Diagnosis not present

## 2016-12-02 DIAGNOSIS — M6281 Muscle weakness (generalized): Secondary | ICD-10-CM | POA: Diagnosis not present

## 2016-12-02 DIAGNOSIS — Z7984 Long term (current) use of oral hypoglycemic drugs: Secondary | ICD-10-CM | POA: Diagnosis not present

## 2016-12-02 DIAGNOSIS — I10 Essential (primary) hypertension: Secondary | ICD-10-CM | POA: Diagnosis not present

## 2016-12-02 DIAGNOSIS — R2689 Other abnormalities of gait and mobility: Secondary | ICD-10-CM | POA: Diagnosis not present

## 2016-12-02 DIAGNOSIS — K449 Diaphragmatic hernia without obstruction or gangrene: Secondary | ICD-10-CM | POA: Diagnosis not present

## 2016-12-02 DIAGNOSIS — M545 Low back pain: Secondary | ICD-10-CM | POA: Diagnosis not present

## 2016-12-02 DIAGNOSIS — G5600 Carpal tunnel syndrome, unspecified upper limb: Secondary | ICD-10-CM | POA: Diagnosis not present

## 2016-12-02 DIAGNOSIS — M5106 Intervertebral disc disorders with myelopathy, lumbar region: Secondary | ICD-10-CM | POA: Diagnosis not present

## 2016-12-02 DIAGNOSIS — E785 Hyperlipidemia, unspecified: Secondary | ICD-10-CM | POA: Diagnosis not present

## 2016-12-04 DIAGNOSIS — M545 Low back pain: Secondary | ICD-10-CM | POA: Diagnosis not present

## 2016-12-04 DIAGNOSIS — M4327 Fusion of spine, lumbosacral region: Secondary | ICD-10-CM | POA: Diagnosis not present

## 2016-12-04 DIAGNOSIS — M6281 Muscle weakness (generalized): Secondary | ICD-10-CM | POA: Diagnosis not present

## 2016-12-04 DIAGNOSIS — K582 Mixed irritable bowel syndrome: Secondary | ICD-10-CM | POA: Diagnosis not present

## 2016-12-04 DIAGNOSIS — R2689 Other abnormalities of gait and mobility: Secondary | ICD-10-CM | POA: Diagnosis not present

## 2016-12-04 DIAGNOSIS — M4716 Other spondylosis with myelopathy, lumbar region: Secondary | ICD-10-CM | POA: Diagnosis not present

## 2016-12-04 DIAGNOSIS — K219 Gastro-esophageal reflux disease without esophagitis: Secondary | ICD-10-CM | POA: Diagnosis not present

## 2016-12-04 DIAGNOSIS — K5900 Constipation, unspecified: Secondary | ICD-10-CM | POA: Diagnosis not present

## 2016-12-04 DIAGNOSIS — M4316 Spondylolisthesis, lumbar region: Secondary | ICD-10-CM | POA: Diagnosis not present

## 2016-12-04 DIAGNOSIS — M48062 Spinal stenosis, lumbar region with neurogenic claudication: Secondary | ICD-10-CM | POA: Diagnosis not present

## 2016-12-04 DIAGNOSIS — Z4789 Encounter for other orthopedic aftercare: Secondary | ICD-10-CM | POA: Diagnosis not present

## 2016-12-04 DIAGNOSIS — M5137 Other intervertebral disc degeneration, lumbosacral region: Secondary | ICD-10-CM | POA: Diagnosis not present

## 2016-12-04 DIAGNOSIS — I10 Essential (primary) hypertension: Secondary | ICD-10-CM | POA: Diagnosis not present

## 2016-12-04 DIAGNOSIS — K589 Irritable bowel syndrome without diarrhea: Secondary | ICD-10-CM | POA: Diagnosis not present

## 2016-12-04 DIAGNOSIS — E119 Type 2 diabetes mellitus without complications: Secondary | ICD-10-CM | POA: Diagnosis not present

## 2016-12-04 DIAGNOSIS — G5600 Carpal tunnel syndrome, unspecified upper limb: Secondary | ICD-10-CM | POA: Diagnosis not present

## 2016-12-04 DIAGNOSIS — E785 Hyperlipidemia, unspecified: Secondary | ICD-10-CM | POA: Diagnosis not present

## 2016-12-04 DIAGNOSIS — M961 Postlaminectomy syndrome, not elsewhere classified: Secondary | ICD-10-CM | POA: Diagnosis not present

## 2016-12-10 DIAGNOSIS — K5900 Constipation, unspecified: Secondary | ICD-10-CM | POA: Diagnosis not present

## 2016-12-10 DIAGNOSIS — K219 Gastro-esophageal reflux disease without esophagitis: Secondary | ICD-10-CM | POA: Diagnosis not present

## 2016-12-10 DIAGNOSIS — K589 Irritable bowel syndrome without diarrhea: Secondary | ICD-10-CM | POA: Diagnosis not present

## 2016-12-18 DIAGNOSIS — M545 Low back pain: Secondary | ICD-10-CM | POA: Diagnosis not present

## 2016-12-18 DIAGNOSIS — M6281 Muscle weakness (generalized): Secondary | ICD-10-CM | POA: Diagnosis not present

## 2016-12-18 DIAGNOSIS — K582 Mixed irritable bowel syndrome: Secondary | ICD-10-CM | POA: Diagnosis not present

## 2016-12-18 DIAGNOSIS — M48062 Spinal stenosis, lumbar region with neurogenic claudication: Secondary | ICD-10-CM | POA: Diagnosis not present

## 2016-12-18 DIAGNOSIS — E119 Type 2 diabetes mellitus without complications: Secondary | ICD-10-CM | POA: Diagnosis not present

## 2016-12-21 DIAGNOSIS — Z4789 Encounter for other orthopedic aftercare: Secondary | ICD-10-CM | POA: Diagnosis not present

## 2016-12-21 DIAGNOSIS — K219 Gastro-esophageal reflux disease without esophagitis: Secondary | ICD-10-CM | POA: Diagnosis not present

## 2016-12-21 DIAGNOSIS — G5600 Carpal tunnel syndrome, unspecified upper limb: Secondary | ICD-10-CM | POA: Diagnosis not present

## 2016-12-21 DIAGNOSIS — K589 Irritable bowel syndrome without diarrhea: Secondary | ICD-10-CM | POA: Diagnosis not present

## 2016-12-21 DIAGNOSIS — E119 Type 2 diabetes mellitus without complications: Secondary | ICD-10-CM | POA: Diagnosis not present

## 2016-12-21 DIAGNOSIS — I10 Essential (primary) hypertension: Secondary | ICD-10-CM | POA: Diagnosis not present

## 2016-12-22 ENCOUNTER — Other Ambulatory Visit: Payer: Self-pay | Admitting: Gastroenterology

## 2016-12-22 ENCOUNTER — Other Ambulatory Visit: Payer: Self-pay | Admitting: Endocrinology

## 2016-12-23 DIAGNOSIS — G5600 Carpal tunnel syndrome, unspecified upper limb: Secondary | ICD-10-CM | POA: Diagnosis not present

## 2016-12-23 DIAGNOSIS — K589 Irritable bowel syndrome without diarrhea: Secondary | ICD-10-CM | POA: Diagnosis not present

## 2016-12-23 DIAGNOSIS — K219 Gastro-esophageal reflux disease without esophagitis: Secondary | ICD-10-CM | POA: Diagnosis not present

## 2016-12-23 DIAGNOSIS — E119 Type 2 diabetes mellitus without complications: Secondary | ICD-10-CM | POA: Diagnosis not present

## 2016-12-23 DIAGNOSIS — I10 Essential (primary) hypertension: Secondary | ICD-10-CM | POA: Diagnosis not present

## 2016-12-23 DIAGNOSIS — Z4789 Encounter for other orthopedic aftercare: Secondary | ICD-10-CM | POA: Diagnosis not present

## 2016-12-24 DIAGNOSIS — G5600 Carpal tunnel syndrome, unspecified upper limb: Secondary | ICD-10-CM | POA: Diagnosis not present

## 2016-12-24 DIAGNOSIS — K219 Gastro-esophageal reflux disease without esophagitis: Secondary | ICD-10-CM | POA: Diagnosis not present

## 2016-12-24 DIAGNOSIS — I10 Essential (primary) hypertension: Secondary | ICD-10-CM | POA: Diagnosis not present

## 2016-12-24 DIAGNOSIS — E119 Type 2 diabetes mellitus without complications: Secondary | ICD-10-CM | POA: Diagnosis not present

## 2016-12-24 DIAGNOSIS — Z4789 Encounter for other orthopedic aftercare: Secondary | ICD-10-CM | POA: Diagnosis not present

## 2016-12-24 DIAGNOSIS — K589 Irritable bowel syndrome without diarrhea: Secondary | ICD-10-CM | POA: Diagnosis not present

## 2016-12-25 DIAGNOSIS — Z4789 Encounter for other orthopedic aftercare: Secondary | ICD-10-CM | POA: Diagnosis not present

## 2016-12-25 DIAGNOSIS — E119 Type 2 diabetes mellitus without complications: Secondary | ICD-10-CM | POA: Diagnosis not present

## 2016-12-25 DIAGNOSIS — K219 Gastro-esophageal reflux disease without esophagitis: Secondary | ICD-10-CM | POA: Diagnosis not present

## 2016-12-25 DIAGNOSIS — G5600 Carpal tunnel syndrome, unspecified upper limb: Secondary | ICD-10-CM | POA: Diagnosis not present

## 2016-12-25 DIAGNOSIS — K589 Irritable bowel syndrome without diarrhea: Secondary | ICD-10-CM | POA: Diagnosis not present

## 2016-12-25 DIAGNOSIS — I10 Essential (primary) hypertension: Secondary | ICD-10-CM | POA: Diagnosis not present

## 2016-12-26 DIAGNOSIS — I10 Essential (primary) hypertension: Secondary | ICD-10-CM | POA: Diagnosis not present

## 2016-12-26 DIAGNOSIS — K219 Gastro-esophageal reflux disease without esophagitis: Secondary | ICD-10-CM | POA: Diagnosis not present

## 2016-12-26 DIAGNOSIS — Z4789 Encounter for other orthopedic aftercare: Secondary | ICD-10-CM | POA: Diagnosis not present

## 2016-12-26 DIAGNOSIS — G5600 Carpal tunnel syndrome, unspecified upper limb: Secondary | ICD-10-CM | POA: Diagnosis not present

## 2016-12-26 DIAGNOSIS — E119 Type 2 diabetes mellitus without complications: Secondary | ICD-10-CM | POA: Diagnosis not present

## 2016-12-26 DIAGNOSIS — K589 Irritable bowel syndrome without diarrhea: Secondary | ICD-10-CM | POA: Diagnosis not present

## 2016-12-30 DIAGNOSIS — M5137 Other intervertebral disc degeneration, lumbosacral region: Secondary | ICD-10-CM | POA: Diagnosis not present

## 2016-12-31 ENCOUNTER — Ambulatory Visit: Payer: Medicare Other | Admitting: Internal Medicine

## 2017-01-01 DIAGNOSIS — G5600 Carpal tunnel syndrome, unspecified upper limb: Secondary | ICD-10-CM | POA: Diagnosis not present

## 2017-01-01 DIAGNOSIS — I10 Essential (primary) hypertension: Secondary | ICD-10-CM | POA: Diagnosis not present

## 2017-01-01 DIAGNOSIS — E119 Type 2 diabetes mellitus without complications: Secondary | ICD-10-CM | POA: Diagnosis not present

## 2017-01-01 DIAGNOSIS — K589 Irritable bowel syndrome without diarrhea: Secondary | ICD-10-CM | POA: Diagnosis not present

## 2017-01-01 DIAGNOSIS — Z4789 Encounter for other orthopedic aftercare: Secondary | ICD-10-CM | POA: Diagnosis not present

## 2017-01-01 DIAGNOSIS — K219 Gastro-esophageal reflux disease without esophagitis: Secondary | ICD-10-CM | POA: Diagnosis not present

## 2017-01-05 DIAGNOSIS — K219 Gastro-esophageal reflux disease without esophagitis: Secondary | ICD-10-CM | POA: Diagnosis not present

## 2017-01-05 DIAGNOSIS — G5600 Carpal tunnel syndrome, unspecified upper limb: Secondary | ICD-10-CM | POA: Diagnosis not present

## 2017-01-05 DIAGNOSIS — Z4789 Encounter for other orthopedic aftercare: Secondary | ICD-10-CM | POA: Diagnosis not present

## 2017-01-05 DIAGNOSIS — I10 Essential (primary) hypertension: Secondary | ICD-10-CM | POA: Diagnosis not present

## 2017-01-05 DIAGNOSIS — E119 Type 2 diabetes mellitus without complications: Secondary | ICD-10-CM | POA: Diagnosis not present

## 2017-01-05 DIAGNOSIS — K589 Irritable bowel syndrome without diarrhea: Secondary | ICD-10-CM | POA: Diagnosis not present

## 2017-01-07 DIAGNOSIS — K219 Gastro-esophageal reflux disease without esophagitis: Secondary | ICD-10-CM | POA: Diagnosis not present

## 2017-01-07 DIAGNOSIS — Z4789 Encounter for other orthopedic aftercare: Secondary | ICD-10-CM | POA: Diagnosis not present

## 2017-01-07 DIAGNOSIS — E119 Type 2 diabetes mellitus without complications: Secondary | ICD-10-CM | POA: Diagnosis not present

## 2017-01-07 DIAGNOSIS — I10 Essential (primary) hypertension: Secondary | ICD-10-CM | POA: Diagnosis not present

## 2017-01-07 DIAGNOSIS — G5600 Carpal tunnel syndrome, unspecified upper limb: Secondary | ICD-10-CM | POA: Diagnosis not present

## 2017-01-07 DIAGNOSIS — K589 Irritable bowel syndrome without diarrhea: Secondary | ICD-10-CM | POA: Diagnosis not present

## 2017-01-09 DIAGNOSIS — K589 Irritable bowel syndrome without diarrhea: Secondary | ICD-10-CM | POA: Diagnosis not present

## 2017-01-09 DIAGNOSIS — E119 Type 2 diabetes mellitus without complications: Secondary | ICD-10-CM | POA: Diagnosis not present

## 2017-01-09 DIAGNOSIS — K219 Gastro-esophageal reflux disease without esophagitis: Secondary | ICD-10-CM | POA: Diagnosis not present

## 2017-01-09 DIAGNOSIS — Z4789 Encounter for other orthopedic aftercare: Secondary | ICD-10-CM | POA: Diagnosis not present

## 2017-01-09 DIAGNOSIS — G5600 Carpal tunnel syndrome, unspecified upper limb: Secondary | ICD-10-CM | POA: Diagnosis not present

## 2017-01-09 DIAGNOSIS — I10 Essential (primary) hypertension: Secondary | ICD-10-CM | POA: Diagnosis not present

## 2017-01-12 DIAGNOSIS — K589 Irritable bowel syndrome without diarrhea: Secondary | ICD-10-CM | POA: Diagnosis not present

## 2017-01-12 DIAGNOSIS — G5600 Carpal tunnel syndrome, unspecified upper limb: Secondary | ICD-10-CM | POA: Diagnosis not present

## 2017-01-12 DIAGNOSIS — I10 Essential (primary) hypertension: Secondary | ICD-10-CM | POA: Diagnosis not present

## 2017-01-12 DIAGNOSIS — K219 Gastro-esophageal reflux disease without esophagitis: Secondary | ICD-10-CM | POA: Diagnosis not present

## 2017-01-12 DIAGNOSIS — Z4789 Encounter for other orthopedic aftercare: Secondary | ICD-10-CM | POA: Diagnosis not present

## 2017-01-12 DIAGNOSIS — E119 Type 2 diabetes mellitus without complications: Secondary | ICD-10-CM | POA: Diagnosis not present

## 2017-01-13 DIAGNOSIS — G5600 Carpal tunnel syndrome, unspecified upper limb: Secondary | ICD-10-CM | POA: Diagnosis not present

## 2017-01-13 DIAGNOSIS — K589 Irritable bowel syndrome without diarrhea: Secondary | ICD-10-CM | POA: Diagnosis not present

## 2017-01-13 DIAGNOSIS — K219 Gastro-esophageal reflux disease without esophagitis: Secondary | ICD-10-CM | POA: Diagnosis not present

## 2017-01-13 DIAGNOSIS — E119 Type 2 diabetes mellitus without complications: Secondary | ICD-10-CM | POA: Diagnosis not present

## 2017-01-13 DIAGNOSIS — I10 Essential (primary) hypertension: Secondary | ICD-10-CM | POA: Diagnosis not present

## 2017-01-13 DIAGNOSIS — Z4789 Encounter for other orthopedic aftercare: Secondary | ICD-10-CM | POA: Diagnosis not present

## 2017-01-15 DIAGNOSIS — K219 Gastro-esophageal reflux disease without esophagitis: Secondary | ICD-10-CM | POA: Diagnosis not present

## 2017-01-15 DIAGNOSIS — E119 Type 2 diabetes mellitus without complications: Secondary | ICD-10-CM | POA: Diagnosis not present

## 2017-01-15 DIAGNOSIS — K589 Irritable bowel syndrome without diarrhea: Secondary | ICD-10-CM | POA: Diagnosis not present

## 2017-01-15 DIAGNOSIS — Z4789 Encounter for other orthopedic aftercare: Secondary | ICD-10-CM | POA: Diagnosis not present

## 2017-01-15 DIAGNOSIS — I10 Essential (primary) hypertension: Secondary | ICD-10-CM | POA: Diagnosis not present

## 2017-01-15 DIAGNOSIS — G5600 Carpal tunnel syndrome, unspecified upper limb: Secondary | ICD-10-CM | POA: Diagnosis not present

## 2017-01-16 ENCOUNTER — Other Ambulatory Visit: Payer: Self-pay | Admitting: Gynecology

## 2017-01-16 DIAGNOSIS — I10 Essential (primary) hypertension: Secondary | ICD-10-CM | POA: Diagnosis not present

## 2017-01-16 DIAGNOSIS — G5600 Carpal tunnel syndrome, unspecified upper limb: Secondary | ICD-10-CM | POA: Diagnosis not present

## 2017-01-16 DIAGNOSIS — Z4789 Encounter for other orthopedic aftercare: Secondary | ICD-10-CM | POA: Diagnosis not present

## 2017-01-16 DIAGNOSIS — Z1231 Encounter for screening mammogram for malignant neoplasm of breast: Secondary | ICD-10-CM

## 2017-01-16 DIAGNOSIS — K219 Gastro-esophageal reflux disease without esophagitis: Secondary | ICD-10-CM | POA: Diagnosis not present

## 2017-01-16 DIAGNOSIS — K589 Irritable bowel syndrome without diarrhea: Secondary | ICD-10-CM | POA: Diagnosis not present

## 2017-01-16 DIAGNOSIS — E119 Type 2 diabetes mellitus without complications: Secondary | ICD-10-CM | POA: Diagnosis not present

## 2017-01-21 ENCOUNTER — Other Ambulatory Visit: Payer: Self-pay | Admitting: Endocrinology

## 2017-01-22 DIAGNOSIS — E119 Type 2 diabetes mellitus without complications: Secondary | ICD-10-CM | POA: Diagnosis not present

## 2017-01-22 DIAGNOSIS — G5600 Carpal tunnel syndrome, unspecified upper limb: Secondary | ICD-10-CM | POA: Diagnosis not present

## 2017-01-22 DIAGNOSIS — K219 Gastro-esophageal reflux disease without esophagitis: Secondary | ICD-10-CM | POA: Diagnosis not present

## 2017-01-22 DIAGNOSIS — K589 Irritable bowel syndrome without diarrhea: Secondary | ICD-10-CM | POA: Diagnosis not present

## 2017-01-22 DIAGNOSIS — I10 Essential (primary) hypertension: Secondary | ICD-10-CM | POA: Diagnosis not present

## 2017-01-22 DIAGNOSIS — Z4789 Encounter for other orthopedic aftercare: Secondary | ICD-10-CM | POA: Diagnosis not present

## 2017-01-30 DIAGNOSIS — G5600 Carpal tunnel syndrome, unspecified upper limb: Secondary | ICD-10-CM | POA: Diagnosis not present

## 2017-01-30 DIAGNOSIS — K219 Gastro-esophageal reflux disease without esophagitis: Secondary | ICD-10-CM | POA: Diagnosis not present

## 2017-01-30 DIAGNOSIS — Z4789 Encounter for other orthopedic aftercare: Secondary | ICD-10-CM | POA: Diagnosis not present

## 2017-01-30 DIAGNOSIS — I10 Essential (primary) hypertension: Secondary | ICD-10-CM | POA: Diagnosis not present

## 2017-01-30 DIAGNOSIS — K589 Irritable bowel syndrome without diarrhea: Secondary | ICD-10-CM | POA: Diagnosis not present

## 2017-01-30 DIAGNOSIS — E119 Type 2 diabetes mellitus without complications: Secondary | ICD-10-CM | POA: Diagnosis not present

## 2017-02-05 ENCOUNTER — Ambulatory Visit
Admission: RE | Admit: 2017-02-05 | Discharge: 2017-02-05 | Disposition: A | Discharge status: Home / Self Care | Payer: Medicare Other | Source: Ambulatory Visit | Attending: Gynecology | Admitting: Gynecology

## 2017-02-05 DIAGNOSIS — Z1231 Encounter for screening mammogram for malignant neoplasm of breast: Secondary | ICD-10-CM | POA: Diagnosis not present

## 2017-02-25 DIAGNOSIS — M4326 Fusion of spine, lumbar region: Secondary | ICD-10-CM | POA: Diagnosis not present

## 2017-03-03 ENCOUNTER — Other Ambulatory Visit (INDEPENDENT_AMBULATORY_CARE_PROVIDER_SITE_OTHER): Payer: Medicare Other

## 2017-03-03 DIAGNOSIS — E119 Type 2 diabetes mellitus without complications: Secondary | ICD-10-CM

## 2017-03-03 LAB — COMPREHENSIVE METABOLIC PANEL
ALT: 27 U/L (ref 0–35)
AST: 28 U/L (ref 0–37)
Albumin: 3.9 g/dL (ref 3.5–5.2)
Alkaline Phosphatase: 110 U/L (ref 39–117)
BUN: 19 mg/dL (ref 6–23)
CO2: 24 mEq/L (ref 19–32)
Calcium: 9.5 mg/dL (ref 8.4–10.5)
Chloride: 106 mEq/L (ref 96–112)
Creatinine, Ser: 1.07 mg/dL (ref 0.40–1.20)
GFR: 64.81 mL/min (ref >60.00)
Glucose, Bld: 97 mg/dL (ref 70–99)
Potassium: 3.5 mEq/L (ref 3.5–5.1)
Sodium: 139 mEq/L (ref 135–145)
Total Bilirubin: 0.3 mg/dL (ref 0.2–1.2)
Total Protein: 7.8 g/dL (ref 6.0–8.3)

## 2017-03-03 LAB — LIPID PANEL
Cholesterol: 144 mg/dL (ref 0–200)
HDL: 69.8 mg/dL (ref >39.00)
LDL Cholesterol: 59 mg/dL (ref 0–99)
NonHDL: 74.18
Total CHOL/HDL Ratio: 2
Triglycerides: 75 mg/dL (ref 0.0–149.0)
VLDL: 15 mg/dL (ref 0.0–40.0)

## 2017-03-03 LAB — HEMOGLOBIN A1C: Hgb A1c MFr Bld: 5.8 % (ref 4.6–6.5)

## 2017-03-04 ENCOUNTER — Telehealth: Payer: Self-pay | Admitting: Gastroenterology

## 2017-03-04 MED ORDER — DEXLANSOPRAZOLE 60 MG PO CPDR: 60.0000 mg | DELAYED_RELEASE_CAPSULE | Freq: Every day | ORAL | 0 refills | Status: DC

## 2017-03-04 NOTE — Telephone Encounter (Signed)
Left a message for patient to call back. 

## 2017-03-04 NOTE — Telephone Encounter (Signed)
Informed patient she did not get refills because she is over due for a follow up visit. Patient scheduled appt for 04/27/17 9:45am. Patient states she needs her rx faxed to patient assistance at fax # [phone]. Informed patient that I will fax her prescription for 90 day supply until her appt, then I will give her a years worth of refills when she comes in for her visit. Patient verbalized understanding.

## 2017-03-06 ENCOUNTER — Encounter: Payer: Self-pay | Admitting: Endocrinology

## 2017-03-06 ENCOUNTER — Ambulatory Visit (INDEPENDENT_AMBULATORY_CARE_PROVIDER_SITE_OTHER): Payer: Medicare Other | Admitting: Endocrinology

## 2017-03-06 VITALS — BP 122/68 | HR 80 | Temp 99.0°F | Resp 15 | Ht 65.0 in | Wt 198.0 lb

## 2017-03-06 DIAGNOSIS — E119 Type 2 diabetes mellitus without complications: Secondary | ICD-10-CM

## 2017-03-06 MED ORDER — GLUCOSE BLOOD VI STRP: ORAL_STRIP | 1 refills | Status: DC

## 2017-03-06 NOTE — Progress Notes (Signed)
Patient ID: Daisy Dillon, female   DOB: September 05, 1945, 71 y.o.   MRN: [id number]   Reason for Appointment: follow-up   History of Present Illness   Diagnosis: Type 2 DIABETES MELITUS, date of diagnosis:   2002   She has had stable control of her mild diabetes with combination of low-doses of Actos and metformin She usually has had a normal A1c and this stays in the 5.7-5.8 range fairly consistently  She has checked her sugar periodically as directed including after meals, results below  She has gained back the weight she had lost last year She does not really have meal plan and has not seen a dietitian in several years She is not able to exercise because of low back pain and only occasionally doing chair exercises as discussed before  Monitors blood glucose: Less than once a day .    Glucometer: One touch ultra mini         Blood Glucose readings from download:   Range 93-1 63 at various times  with several paired readings before and after meals, has only one high reading over 150 at night after eating Overall median 115        Physical activity: exercise: Minimal because of back pain            Complications: are: No history of neuropathy     Wt Readings from Last 3 Encounters:  03/06/17 198 lb (89.8 kg)  10/02/16 196 lb (88.9 kg)  09/03/16 196 lb 3.2 oz (89 kg)    Lab Results  Component Value Date   HGBA1C 5.8 03/03/2017   HGBA1C 5.8 08/29/2016   HGBA1C 5.7 05/28/2016   Lab Results  Component Value Date   MICROALBUR 0.9 08/29/2016   LDLCALC 59 03/03/2017   CREATININE 1.07 03/03/2017    OTHER active problems: See review of systems   Lab on 03/03/2017  Component Date Value Ref Range Status  . Hgb A1c MFr Bld 03/03/2017 5.8  4.6 - 6.5 % Final   Glycemic Control Guidelines for People with Diabetes:Non Diabetic:  <6%Goal of Therapy: <7%Additional Action Suggested:  >8%   . Sodium 03/03/2017 139  135 - 145 mEq/L Final  . Potassium 03/03/2017 3.5  3.5 - 5.1  mEq/L Final  . Chloride 03/03/2017 106  96 - 112 mEq/L Final  . CO2 03/03/2017 24  19 - 32 mEq/L Final  . Glucose, Bld 03/03/2017 97  70 - 99 mg/dL Final  . BUN 03/03/2017 19  6 - 23 mg/dL Final  . Creatinine, Ser 03/03/2017 1.07  0.40 - 1.20 mg/dL Final  . Total Bilirubin 03/03/2017 0.3  0.2 - 1.2 mg/dL Final  . Alkaline Phosphatase 03/03/2017 110  39 - 117 U/L Final  . AST 03/03/2017 28  0 - 37 U/L Final  . ALT 03/03/2017 27  0 - 35 U/L Final  . Total Protein 03/03/2017 7.8  6.0 - 8.3 g/dL Final  . Albumin 03/03/2017 3.9  3.5 - 5.2 g/dL Final  . Calcium 03/03/2017 9.5  8.4 - 10.5 mg/dL Final  . GFR 03/03/2017 64.81  >60.00 mL/min Final  . Cholesterol 03/03/2017 144  0 - 200 mg/dL Final   ATP III Classification       Desirable:  < 200 mg/dL               Borderline High:  200 - 239 mg/dL          High:  > = 240 mg/dL  .  Triglycerides 03/03/2017 75.0  0.0 - 149.0 mg/dL Final   Normal:  <150 mg/dLBorderline High:  150 - 199 mg/dL  . HDL 03/03/2017 69.80  >39.00 mg/dL Final  . VLDL 03/03/2017 15.0  0.0 - 40.0 mg/dL Final  . LDL Cholesterol 03/03/2017 59  0 - 99 mg/dL Final  . Total CHOL/HDL Ratio 03/03/2017 2   Final                  Men          Women1/2 Average Risk     3.4          3.3Average Risk          5.0          4.42X Average Risk          9.6          7.13X Average Risk          15.0          11.0                      . NonHDL 03/03/2017 74.18   Final   NOTE:  Non-HDL goal should be 30 mg/dL higher than patient's LDL goal (i.e. LDL goal of < 70 mg/dL, would have non-HDL goal of < 100 mg/dL)    Allergies as of 03/06/2017      Reactions   Codeine Phosphate    REACTION: unspecified, nausea, vomiting      Medication List       Accurate as of 03/06/17 11:05 AM. Always use your most recent med list.          acetaminophen 650 MG CR tablet Commonly known as:  TYLENOL Take 650 mg by mouth every 8 (eight) hours as needed for pain. Reported on 11/08/2015   aspirin 81 MG  tablet Take 81 mg by mouth Dillon.   bisoprolol-hydrochlorothiazide 2.5-6.25 MG tablet Commonly known as:  ZIAC TAKE ONE TABLET BY MOUTH ONCE Dillon   CALCIUM + D PO Take by mouth.   dexlansoprazole 60 MG capsule Commonly known as:  DEXILANT Take 1 capsule (60 mg total) by mouth Dillon.   dicyclomine 10 MG capsule Commonly known as:  BENTYL TAKE 1 CAPSULE BY MOUTH 4  TIMES Dillon - BEFORE MEALS  AND AT BEDTIME.   EAR WAX REMOVAL DROPS 6.5 % OTIC solution Generic drug:  carbamide peroxide   glucose blood test strip Commonly known as:  ONE TOUCH ULTRA TEST USE AS DIRECTED EVERY DAY Dx code E11.9   losartan 50 MG tablet Commonly known as:  COZAAR TAKE 1 TABLET BY MOUTH  Dillon   metFORMIN 500 MG tablet Commonly known as:  GLUCOPHAGE TAKE 1 TABLET BY MOUTH TWO  TIMES Dillon WITH MEALS   multivitamin tablet Take 1 tablet by mouth Dillon.   ONETOUCH DELICA LANCETS 62X Misc Use to check blood sugar 2 times a week .Dx code E11.9   pioglitazone 15 MG tablet Commonly known as:  ACTOS TAKE 1 TABLET BY MOUTH  Dillon   pravastatin 20 MG tablet Commonly known as:  PRAVACHOL TAKE 1 TABLET BY MOUTH  Dillon   verapamil 240 MG CR tablet Commonly known as:  CALAN-SR TAKE 1 TABLET BY MOUTH AT  BEDTIME   vitamin C 1000 MG tablet Take 1,000 mg by mouth Dillon.   Vitamin D3 1000 units Caps Take 1 capsule by mouth Dillon.       Allergies:  Allergies  Allergen Reactions  . Codeine Phosphate     REACTION: unspecified, nausea, vomiting    Past Medical History:  Diagnosis Date  . Diabetes mellitus   . Diverticulosis of colon (without mention of hemorrhage)   . Elevated cholesterol   . Fibroid   . Gastritis   . GERD (gastroesophageal reflux disease)   . Hiatal hernia   . Hypertension   . IBS (irritable bowel syndrome)   . Positive ANA (antinuclear antibody) 04/23/2012  . Stricture and stenosis of esophagus 09/03/2005  . Urinary incontinence     Past Surgical History:    Procedure Laterality Date  . ABDOMINAL HYSTERECTOMY  1985  . CHOLECYSTECTOMY    . COLONOSCOPY  12/15/2007   diverticulosis  . ESOPHAGOGASTRODUODENOSCOPY  12/24/2012   normal   . KNEE SURGERY Right    arthroscopic    Family History  Problem Relation Age of Onset  . Diabetes Mother   . Hypertension Mother   . Stroke Brother        x 2  . Bipolar disorder Brother   . Stroke Brother   . Diabetes Brother   . Breast cancer Maternal Aunt        Age 63's  . Colon cancer Neg Hx   . Stomach cancer Neg Hx   . Throat cancer Neg Hx   . Liver disease Neg Hx     Social History:  reports that she has never smoked. She has never used smokeless tobacco. She reports that she does not drink alcohol or use drugs.  Review of Systems:  HYPERTENSION:   She takes losartan 50 mg, Verapamil 240 mg and Ziac. Blood pressure is consistently controlled Was previously having hypokalemia with Dyazide    BP Readings from Last 3 Encounters:  03/06/17 112/68  09/03/16 122/72  05/28/16 130/80   Renal functions:  Lab Results  Component Value Date   CREATININE 1.07 03/03/2017   BUN 19 03/03/2017   NA 139 03/03/2017   K 3.5 03/03/2017   CL 106 03/03/2017   CO2 24 03/03/2017     HYPERLIPIDEMIA: The lipid abnormality consists of elevated LDL which is well-controlled on pravastatin 20 mg  Lab Results  Component Value Date   CHOL 144 03/03/2017   HDL 69.80 03/03/2017   LDLCALC 59 03/03/2017   TRIG 75.0 03/03/2017   CHOLHDL 2 03/03/2017           Examination:   BP 112/68 (BP Location: Left Arm, Patient Position: Sitting, Cuff Size: Large)   Pulse 80   Temp 99 F (37.2 C) (Oral)   Resp 15   Ht 5\' 5"  (1.651 m)   Wt 198 lb (89.8 kg)   SpO2 97%   BMI 32.95 kg/m   Body mass index is 32.95 kg/m.    ASSESSMENT/ PLAN:   Diabetes type 2 with  obesity and BMI 32  Blood sugars are consistently well controlled with A1c again 5.8  She is on metformin 1000 mg a day and  15 mg  Actos Although she is not able to exercise she is maintaining her weight and is generally doing well with diet Only rarely will have a postprandial reading around 160 Fasting readings at home are high normal blood 97 in the lab  Since she has a very old 1 touch ultra monitor will switch her to the Verio   Hypertension:  well controlled with Ziac, verapamil and losartan 50 mg, followed by PCP .  Low normal potassium: She was encouraged to  keep a relatively high potassium diet  Follow-up in 6 months again   [facility] 03/06/2017, 11:05 AM

## 2017-03-09 ENCOUNTER — Telehealth: Payer: Self-pay

## 2017-03-09 MED ORDER — BISOPROLOL-HYDROCHLOROTHIAZIDE 2.5-6.25 MG PO TABS: 1.0000 | ORAL_TABLET | Freq: Every day | ORAL | 1 refills | Status: DC

## 2017-03-09 NOTE — Telephone Encounter (Signed)
MEDICATION: bisoprolol-hydrochlorothiazide 2.5-6.25mg  tabs  PHARMACY:  [facility] in [place] on [address] : no  IS PATIENT OUT OF MEDICATION: yes  IF NOT; HOW MUCH IS LEFT:   LAST APPOINTMENT DATE: @9 /21/2018  NEXT APPOINTMENT DATE:@3 /22/2019  OTHER COMMENTS: Patient would like a 2 week supply sent b/c she is waiting on [facility] to send the full 30 day supply.  ALSO  Patient is having issues troubleshooting her meter. Please call back to discuss.

## 2017-03-09 NOTE — Telephone Encounter (Signed)
This has been ordered 

## 2017-03-13 ENCOUNTER — Other Ambulatory Visit: Payer: Self-pay

## 2017-03-13 ENCOUNTER — Telehealth: Payer: Self-pay | Admitting: Endocrinology

## 2017-03-13 MED ORDER — GLUCOSE BLOOD VI STRP: ORAL_STRIP | 3 refills | Status: DC

## 2017-03-13 NOTE — Telephone Encounter (Signed)
Please review

## 2017-03-13 NOTE — Telephone Encounter (Signed)
Patient's meter is messed up and she can not use it. Call patient to advise on how this can be fixed.

## 2017-03-13 NOTE — Telephone Encounter (Signed)
Called patient and her new meter is not reading correctly. She is going to bring by the office today soon and if someone can check it to make sure it is working or give her another new meter and set the time and date before she leaves that would be great!! Thank you!

## 2017-04-12 ENCOUNTER — Other Ambulatory Visit: Payer: Self-pay | Admitting: Endocrinology

## 2017-04-24 ENCOUNTER — Encounter: Payer: Self-pay | Admitting: Obstetrics & Gynecology

## 2017-04-24 ENCOUNTER — Ambulatory Visit: Payer: Medicare Other | Admitting: Obstetrics & Gynecology

## 2017-04-24 VITALS — BP 130/78 | Ht 65.0 in | Wt 202.0 lb

## 2017-04-24 DIAGNOSIS — Z1272 Encounter for screening for malignant neoplasm of vagina: Secondary | ICD-10-CM

## 2017-04-24 DIAGNOSIS — Z23 Encounter for immunization: Secondary | ICD-10-CM

## 2017-04-24 DIAGNOSIS — Z780 Asymptomatic menopausal state: Secondary | ICD-10-CM | POA: Diagnosis not present

## 2017-04-24 DIAGNOSIS — Z01411 Encounter for gynecological examination (general) (routine) with abnormal findings: Secondary | ICD-10-CM

## 2017-04-24 DIAGNOSIS — Z90710 Acquired absence of both cervix and uterus: Secondary | ICD-10-CM

## 2017-04-24 NOTE — Progress Notes (Signed)
Daisy Dillon [birth year] [id number]   History:    71 y.o. G2P2  RP:  Established patient presenting for annual gyn exam   HPI:  S/P Hysterectomy.  Menopause.  Well-tolerated without hormone replacement therapy.  No pelvic pain.  Abstinent.  Breasts normal.  Urine and bowel movements normal.  Health labs with her family physician Dr. Ronnald Ramp.  Past medical history,surgical history, family history and social history were all reviewed and documented in the EPIC chart.  Gynecologic History No LMP recorded. Patient has had a hysterectomy. Contraception: status post hysterectomy Last Pap: 2011. Results were: normal Last mammogram: 2018. Results were: normal Bone Density 05/2015 Normal.  Will repeat at 2 yrs [place] 2009, will schedule in 2019  Obstetric History OB History  Gravida Para Term Preterm AB Living  2 2 2     2   SAB TAB Ectopic Multiple Live Births               # Outcome Date GA Lbr Len/2nd Weight Sex Delivery Anes PTL Lv  2 Term           1 Term                ROS: A ROS was performed and pertinent positives and negatives are included in the history.  GENERAL: No fevers or chills. HEENT: No change in vision, no earache, sore throat or sinus congestion. NECK: No pain or stiffness. CARDIOVASCULAR: No chest pain or pressure. No palpitations. PULMONARY: No shortness of breath, cough or wheeze. GASTROINTESTINAL: No abdominal pain, nausea, vomiting or diarrhea, melena or bright red blood per rectum. GENITOURINARY: No urinary frequency, urgency, hesitancy or dysuria. MUSCULOSKELETAL: No joint or muscle pain, no back pain, no recent trauma. DERMATOLOGIC: No rash, no itching, no lesions. ENDOCRINE: No polyuria, polydipsia, no heat or cold intolerance. No recent change in weight. HEMATOLOGICAL: No anemia or easy bruising or bleeding. NEUROLOGIC: No headache, seizures, numbness, tingling or weakness. PSYCHIATRIC: No depression, no loss of interest in normal activity or change in sleep  pattern.     Exam:   BP 130/78   Ht 5\' 5"  (1.651 m)   Wt 202 lb (91.6 kg)   BMI 33.61 kg/m   Body mass index is 33.61 kg/m.  General appearance : Well developed well nourished female. No acute distress HEENT: Eyes: no retinal hemorrhage or exudates,  Neck supple, trachea midline, no carotid bruits, no thyroidmegaly Lungs: Clear to auscultation, no rhonchi or wheezes, or rib retractions  Heart: Regular rate and rhythm, no murmurs or gallops Breast:Examined in sitting and supine position were symmetrical in appearance, no palpable masses or tenderness,  no skin retraction, no nipple inversion, no nipple discharge, no skin discoloration, no axillary or supraclavicular lymphadenopathy Abdomen: no palpable masses or tenderness, no rebound or guarding Extremities: no edema or skin discoloration or tenderness  Pelvic: Vulva normal  Bartholin, Urethra, Skene Glands: Within normal limits             Vagina: No gross lesions or discharge.  Pap reflex done.  Cervix/Uterus absent  Adnexa  Without masses or tenderness  Anus and perineum  normal    Assessment/Plan:  71 y.o. female for annual exam   1. Encounter for gynecological examination with abnormal finding Gynecologic exam status post total abdominal hysterectomy.  Pap reflex done.  Breast exam normal.  Will schedule next colonoscopy in 2019.  2. Menopause present Menopause well-tolerated without hormone replacement therapy.  Vitamin D supplements, calcium in food and  regular weightbearing physical activity recommended.  Last bone density was completely normal in 2016, will repeat at 3 years.  3. Hx of total hysterectomy  Princess Bruins MD, 11:02 AM 04/24/2017

## 2017-04-27 ENCOUNTER — Encounter: Payer: Self-pay | Admitting: Obstetrics & Gynecology

## 2017-04-27 ENCOUNTER — Ambulatory Visit: Payer: Medicare Other | Admitting: Gastroenterology

## 2017-04-27 ENCOUNTER — Encounter: Payer: Self-pay | Admitting: Gastroenterology

## 2017-04-27 VITALS — BP 126/74 | HR 68 | Ht 65.0 in | Wt 200.2 lb

## 2017-04-27 DIAGNOSIS — Z1211 Encounter for screening for malignant neoplasm of colon: Secondary | ICD-10-CM | POA: Diagnosis not present

## 2017-04-27 DIAGNOSIS — K219 Gastro-esophageal reflux disease without esophagitis: Secondary | ICD-10-CM | POA: Diagnosis not present

## 2017-04-27 DIAGNOSIS — Z1212 Encounter for screening for malignant neoplasm of rectum: Secondary | ICD-10-CM | POA: Diagnosis not present

## 2017-04-27 DIAGNOSIS — K580 Irritable bowel syndrome with diarrhea: Secondary | ICD-10-CM | POA: Diagnosis not present

## 2017-04-27 MED ORDER — DEXLANSOPRAZOLE 60 MG PO CPDR: 60.0000 mg | DELAYED_RELEASE_CAPSULE | Freq: Every day | ORAL | 3 refills | Status: DC

## 2017-04-27 MED ORDER — DICYCLOMINE HCL 20 MG PO TABS: 20.0000 mg | ORAL_TABLET | Freq: Three times a day (TID) | ORAL | 11 refills | Status: DC

## 2017-04-27 NOTE — Patient Instructions (Signed)
We have sent the following medications to your pharmacy for you to pick up at your convenience: [place].   We are increasing your Bentyl to 20 mg four times a day before meals and at bedtime. We sent a new prescription to your pharmacy.   Call back if your symptoms have not improved.   Normal BMI (Body Mass Index- based on height and weight) is between 23 and 30. Your BMI today is Body mass index is 33.32 kg/m. Marland Kitchen Please consider follow up  regarding your BMI with your Primary Care Provider.  Thank you for choosing me and [facility].  Pricilla Riffle. Dagoberto Ligas., MD., Marval Regal

## 2017-04-27 NOTE — Patient Instructions (Signed)
1. Encounter for gynecological examination with abnormal finding Gynecologic exam status post total abdominal hysterectomy.  Pap reflex done.  Breast exam normal.  Will schedule next colonoscopy in 2019.  2. Menopause present Menopause well-tolerated without hormone replacement therapy.  Vitamin D supplements, calcium in food and regular weightbearing physical activity recommended.  Last bone density was completely normal in 2016, will repeat at 3 years.  3. Hx of total hysterectomy  Daisy Dillon, it was a pleasure meeting you today!  I will inform you of your results as soon as available.   Health Maintenance for Postmenopausal Women Menopause is a normal process in which your reproductive ability comes to an end. This process happens gradually over a span of months to years, usually between the ages of 71 and 68. Menopause is complete when you have missed 12 consecutive menstrual periods. It is important to talk with your health care provider about some of the most common conditions that affect postmenopausal women, such as heart disease, cancer, and bone loss (osteoporosis). Adopting a healthy lifestyle and getting preventive care can help to promote your health and wellness. Those actions can also lower your chances of developing some of these common conditions. What should I know about menopause? During menopause, you may experience a number of symptoms, such as:  Moderate-to-severe hot flashes.  Night sweats.  Decrease in sex drive.  Mood swings.  Headaches.  Tiredness.  Irritability.  Memory problems.  Insomnia.  Choosing to treat or not to treat menopausal changes is an individual decision that you make with your health care provider. What should I know about hormone replacement therapy and supplements? Hormone therapy products are effective for treating symptoms that are associated with menopause, such as hot flashes and night sweats. Hormone replacement carries certain risks,  especially as you become older. If you are thinking about using estrogen or estrogen with progestin treatments, discuss the benefits and risks with your health care provider. What should I know about heart disease and stroke? Heart disease, heart attack, and stroke become more likely as you age. This may be due, in part, to the hormonal changes that your body experiences during menopause. These can affect how your body processes dietary fats, triglycerides, and cholesterol. Heart attack and stroke are both medical emergencies. There are many things that you can do to help prevent heart disease and stroke:  Have your blood pressure checked at least every 1-2 years. High blood pressure causes heart disease and increases the risk of stroke.  If you are 5-67 years old, ask your health care provider if you should take aspirin to prevent a heart attack or a stroke.  Do not use any tobacco products, including cigarettes, chewing tobacco, or electronic cigarettes. If you need help quitting, ask your health care provider.  It is important to eat a healthy diet and maintain a healthy weight. ? Be sure to include plenty of vegetables, fruits, low-fat dairy products, and lean protein. ? Avoid eating foods that are high in solid fats, added sugars, or salt (sodium).  Get regular exercise. This is one of the most important things that you can do for your health. ? Try to exercise for at least 150 minutes each week. The type of exercise that you do should increase your heart rate and make you sweat. This is known as moderate-intensity exercise. ? Try to do strengthening exercises at least twice each week. Do these in addition to the moderate-intensity exercise.  Know your numbers.Ask your health care provider  to check your cholesterol and your blood glucose. Continue to have your blood tested as directed by your health care provider.  What should I know about cancer screening? There are several types of  cancer. Take the following steps to reduce your risk and to catch any cancer development as early as possible. Breast Cancer  Practice breast self-awareness. ? This means understanding how your breasts normally appear and feel. ? It also means doing regular breast self-exams. Let your health care provider know about any changes, no matter how small.  If you are 68 or older, have a clinician do a breast exam (clinical breast exam or CBE) every year. Depending on your age, family history, and medical history, it may be recommended that you also have a yearly breast X-ray (mammogram).  If you have a family history of breast cancer, talk with your health care provider about genetic screening.  If you are at high risk for breast cancer, talk with your health care provider about having an MRI and a mammogram every year.  Breast cancer (BRCA) gene test is recommended for women who have family members with BRCA-related cancers. Results of the assessment will determine the need for genetic counseling and BRCA1 and for BRCA2 testing. BRCA-related cancers include these types: ? Breast. This occurs in males or females. ? Ovarian. ? Tubal. This may also be called fallopian tube cancer. ? Cancer of the abdominal or pelvic lining (peritoneal cancer). ? Prostate. ? Pancreatic.  Cervical, Uterine, and Ovarian Cancer Your health care provider may recommend that you be screened regularly for cancer of the pelvic organs. These include your ovaries, uterus, and vagina. This screening involves a pelvic exam, which includes checking for microscopic changes to the surface of your cervix (Pap test).  For women ages 21-65, health care providers may recommend a pelvic exam and a Pap test every three years. For women ages 65-65, they may recommend the Pap test and pelvic exam, combined with testing for human papilloma virus (HPV), every five years. Some types of HPV increase your risk of cervical cancer. Testing for HPV  may also be done on women of any age who have unclear Pap test results.  Other health care providers may not recommend any screening for nonpregnant women who are considered low risk for pelvic cancer and have no symptoms. Ask your health care provider if a screening pelvic exam is right for you.  If you have had past treatment for cervical cancer or a condition that could lead to cancer, you need Pap tests and screening for cancer for at least 20 years after your treatment. If Pap tests have been discontinued for you, your risk factors (such as having a new sexual partner) need to be reassessed to determine if you should start having screenings again. Some women have medical problems that increase the chance of getting cervical cancer. In these cases, your health care provider may recommend that you have screening and Pap tests more often.  If you have a family history of uterine cancer or ovarian cancer, talk with your health care provider about genetic screening.  If you have vaginal bleeding after reaching menopause, tell your health care provider.  There are currently no reliable tests available to screen for ovarian cancer.  Lung Cancer Lung cancer screening is recommended for adults 35-22 years old who are at high risk for lung cancer because of a history of smoking. A yearly low-dose CT scan of the lungs is recommended if you:  Currently  smoke.  Have a history of at least 30 pack-years of smoking and you currently smoke or have quit within the past 15 years. A pack-year is smoking an average of one pack of cigarettes per day for one year.  Yearly screening should:  Continue until it has been 15 years since you quit.  Stop if you develop a health problem that would prevent you from having lung cancer treatment.  Colorectal Cancer  This type of cancer can be detected and can often be prevented.  Routine colorectal cancer screening usually begins at age 77 and continues through age  83.  If you have risk factors for colon cancer, your health care provider may recommend that you be screened at an earlier age.  If you have a family history of colorectal cancer, talk with your health care provider about genetic screening.  Your health care provider may also recommend using home test kits to check for hidden blood in your stool.  A small camera at the end of a tube can be used to examine your colon directly (sigmoidoscopy or colonoscopy). This is done to check for the earliest forms of colorectal cancer.  Direct examination of the colon should be repeated every 5-10 years until age 24. However, if early forms of precancerous polyps or small growths are found or if you have a family history or genetic risk for colorectal cancer, you may need to be screened more often.  Skin Cancer  Check your skin from head to toe regularly.  Monitor any moles. Be sure to tell your health care provider: ? About any new moles or changes in moles, especially if there is a change in a mole's shape or color. ? If you have a mole that is larger than the size of a pencil eraser.  If any of your family members has a history of skin cancer, especially at a young age, talk with your health care provider about genetic screening.  Always use sunscreen. Apply sunscreen liberally and repeatedly throughout the day.  Whenever you are outside, protect yourself by wearing long sleeves, pants, a wide-brimmed hat, and sunglasses.  What should I know about osteoporosis? Osteoporosis is a condition in which bone destruction happens more quickly than new bone creation. After menopause, you may be at an increased risk for osteoporosis. To help prevent osteoporosis or the bone fractures that can happen because of osteoporosis, the following is recommended:  If you are 48-36 years old, get at least 1,000 mg of calcium and at least 600 mg of vitamin D per day.  If you are older than age 27 but younger than age  68, get at least 1,200 mg of calcium and at least 600 mg of vitamin D per day.  If you are older than age 53, get at least 1,200 mg of calcium and at least 800 mg of vitamin D per day.  Smoking and excessive alcohol intake increase the risk of osteoporosis. Eat foods that are rich in calcium and vitamin D, and do weight-bearing exercises several times each week as directed by your health care provider. What should I know about how menopause affects my mental health? Depression may occur at any age, but it is more common as you become older. Common symptoms of depression include:  Low or sad mood.  Changes in sleep patterns.  Changes in appetite or eating patterns.  Feeling an overall lack of motivation or enjoyment of activities that you previously enjoyed.  Frequent crying spells.  Talk  with your health care provider if you think that you are experiencing depression. What should I know about immunizations? It is important that you get and maintain your immunizations. These include:  Tetanus, diphtheria, and pertussis (Tdap) booster vaccine.  Influenza every year before the flu season begins.  Pneumonia vaccine.  Shingles vaccine.  Your health care provider may also recommend other immunizations. This information is not intended to replace advice given to you by your health care provider. Make sure you discuss any questions you have with your health care provider. Document Released: 07/25/2005 Document Revised: 12/21/2015 Document Reviewed: 03/06/2015 Elsevier Interactive Patient Education  2018 [employer]  

## 2017-04-27 NOTE — Progress Notes (Signed)
    History of Present Illness: This is a 71 year old female with GERD and IBS.  Reflux symptoms are under good control.  She states dicyclomine has not been effective for her periumbilical abdominal crampy pain.  Current Medications, Allergies, Past Medical History, Past Surgical History, Family History and Social History were reviewed in [employer] record.  Physical Exam: General: Well developed, well nourished, no acute distress Head: Normocephalic and atraumatic Eyes:  sclerae anicteric, EOMI Ears: Normal auditory acuity Mouth: No deformity or lesions Lungs: Clear throughout to auscultation Heart: Regular rate and rhythm; no murmurs, rubs or bruits Abdomen: Soft, non tender and non distended. No masses, hepatosplenomegaly or hernias noted. Normal Bowel sounds Musculoskeletal: Symmetrical with no gross deformities  Pulses:  Normal pulses noted Extremities: No clubbing, cyanosis, edema or deformities noted Neurological: Alert oriented x 4, grossly nonfocal Psychological:  Alert and cooperative. Normal mood and affect  Assessment and Recommendations:  1. GERD. Continue Dexilant 60 mg po qam and follow standard antireflux measures.  2. IBS. Increase dicyclomine 20 mg po qid ac and hs. symptoms not significantly improved in 2-3 weeks patient is advised to call for further evaluation.  3.  CRC screening, average risk.  A 10-year interval screening colonoscopy is due in July 2019.

## 2017-04-29 LAB — PAP IG W/ RFLX HPV ASCU

## 2017-05-01 ENCOUNTER — Encounter: Payer: Self-pay | Admitting: *Deleted

## 2017-05-27 DIAGNOSIS — M545 Low back pain: Secondary | ICD-10-CM | POA: Diagnosis not present

## 2017-05-27 DIAGNOSIS — M4716 Other spondylosis with myelopathy, lumbar region: Secondary | ICD-10-CM | POA: Diagnosis not present

## 2017-05-27 DIAGNOSIS — M4326 Fusion of spine, lumbar region: Secondary | ICD-10-CM | POA: Diagnosis not present

## 2017-06-01 DIAGNOSIS — M545 Low back pain: Secondary | ICD-10-CM | POA: Diagnosis not present

## 2017-06-01 DIAGNOSIS — G894 Chronic pain syndrome: Secondary | ICD-10-CM | POA: Diagnosis not present

## 2017-06-01 DIAGNOSIS — M6281 Muscle weakness (generalized): Secondary | ICD-10-CM | POA: Diagnosis not present

## 2017-06-10 DIAGNOSIS — M6281 Muscle weakness (generalized): Secondary | ICD-10-CM | POA: Diagnosis not present

## 2017-06-10 DIAGNOSIS — G894 Chronic pain syndrome: Secondary | ICD-10-CM | POA: Diagnosis not present

## 2017-06-10 DIAGNOSIS — M545 Low back pain: Secondary | ICD-10-CM | POA: Diagnosis not present

## 2017-06-12 ENCOUNTER — Ambulatory Visit: Payer: Medicare Other | Admitting: Internal Medicine

## 2017-06-12 DIAGNOSIS — M6281 Muscle weakness (generalized): Secondary | ICD-10-CM | POA: Diagnosis not present

## 2017-06-12 DIAGNOSIS — G894 Chronic pain syndrome: Secondary | ICD-10-CM | POA: Diagnosis not present

## 2017-06-12 DIAGNOSIS — M545 Low back pain: Secondary | ICD-10-CM | POA: Diagnosis not present

## 2017-06-15 DIAGNOSIS — E119 Type 2 diabetes mellitus without complications: Secondary | ICD-10-CM | POA: Diagnosis not present

## 2017-06-17 DIAGNOSIS — M6281 Muscle weakness (generalized): Secondary | ICD-10-CM | POA: Diagnosis not present

## 2017-06-17 DIAGNOSIS — G894 Chronic pain syndrome: Secondary | ICD-10-CM | POA: Diagnosis not present

## 2017-06-17 DIAGNOSIS — M545 Low back pain: Secondary | ICD-10-CM | POA: Diagnosis not present

## 2017-06-19 DIAGNOSIS — G894 Chronic pain syndrome: Secondary | ICD-10-CM | POA: Diagnosis not present

## 2017-06-19 DIAGNOSIS — M545 Low back pain: Secondary | ICD-10-CM | POA: Diagnosis not present

## 2017-06-19 DIAGNOSIS — M6281 Muscle weakness (generalized): Secondary | ICD-10-CM | POA: Diagnosis not present

## 2017-06-24 DIAGNOSIS — M545 Low back pain: Secondary | ICD-10-CM | POA: Diagnosis not present

## 2017-06-24 DIAGNOSIS — M6281 Muscle weakness (generalized): Secondary | ICD-10-CM | POA: Diagnosis not present

## 2017-06-24 DIAGNOSIS — G894 Chronic pain syndrome: Secondary | ICD-10-CM | POA: Diagnosis not present

## 2017-06-25 ENCOUNTER — Other Ambulatory Visit: Payer: Self-pay

## 2017-06-25 ENCOUNTER — Telehealth: Payer: Self-pay | Admitting: Gastroenterology

## 2017-06-25 MED ORDER — DEXLANSOPRAZOLE 60 MG PO CPDR: 60.0000 mg | DELAYED_RELEASE_CAPSULE | Freq: Every day | ORAL | 3 refills | Status: DC

## 2017-06-25 NOTE — Telephone Encounter (Signed)
Informed patient that I did receive the paper work for her patient assistance for Dexilant and I am waiting on Dr. Fuller Plan to sign the paper work. Informed patient I will fax the forms to [facility] tomorrow and mail back her papers to her. Patient verbalized understanding.

## 2017-06-29 NOTE — Telephone Encounter (Signed)
Received fax approval from [place] until 06/15/18 and stating patients medication will be shipped our within the next few days.

## 2017-07-01 DIAGNOSIS — G894 Chronic pain syndrome: Secondary | ICD-10-CM | POA: Diagnosis not present

## 2017-07-01 DIAGNOSIS — M545 Low back pain: Secondary | ICD-10-CM | POA: Diagnosis not present

## 2017-07-01 DIAGNOSIS — M6281 Muscle weakness (generalized): Secondary | ICD-10-CM | POA: Diagnosis not present

## 2017-07-02 ENCOUNTER — Encounter: Payer: Self-pay | Admitting: Internal Medicine

## 2017-07-02 ENCOUNTER — Other Ambulatory Visit (INDEPENDENT_AMBULATORY_CARE_PROVIDER_SITE_OTHER): Payer: Medicare Other

## 2017-07-02 ENCOUNTER — Ambulatory Visit (INDEPENDENT_AMBULATORY_CARE_PROVIDER_SITE_OTHER): Payer: Medicare Other | Admitting: Internal Medicine

## 2017-07-02 ENCOUNTER — Other Ambulatory Visit: Payer: Self-pay | Admitting: Internal Medicine

## 2017-07-02 VITALS — BP 128/84 | HR 78 | Temp 98.6°F | Ht 65.0 in | Wt 203.0 lb

## 2017-07-02 DIAGNOSIS — E119 Type 2 diabetes mellitus without complications: Secondary | ICD-10-CM

## 2017-07-02 DIAGNOSIS — M545 Low back pain, unspecified: Secondary | ICD-10-CM

## 2017-07-02 DIAGNOSIS — Z0000 Encounter for general adult medical examination without abnormal findings: Secondary | ICD-10-CM | POA: Diagnosis not present

## 2017-07-02 DIAGNOSIS — D649 Anemia, unspecified: Secondary | ICD-10-CM | POA: Diagnosis not present

## 2017-07-02 LAB — URINALYSIS, ROUTINE W REFLEX MICROSCOPIC
Bilirubin Urine: NEGATIVE
Hgb urine dipstick: NEGATIVE
Ketones, ur: NEGATIVE
Leukocytes, UA: NEGATIVE
Nitrite: NEGATIVE
RBC / HPF: NONE SEEN (ref 0–?)
Specific Gravity, Urine: 1.02 (ref 1.000–1.030)
Total Protein, Urine: NEGATIVE
Urine Glucose: NEGATIVE
Urobilinogen, UA: 0.2 (ref 0.0–1.0)
pH: 5.5 (ref 5.0–8.0)

## 2017-07-02 LAB — CBC WITH DIFFERENTIAL/PLATELET
Basophils Absolute: 0 10*3/uL (ref 0.0–0.1)
Basophils Relative: 0.6 % (ref 0.0–3.0)
Eosinophils Absolute: 0.1 10*3/uL (ref 0.0–0.7)
Eosinophils Relative: 2 % (ref 0.0–5.0)
HCT: 35.6 % — ABNORMAL LOW (ref 36.0–46.0)
Hemoglobin: 11.3 g/dL — ABNORMAL LOW (ref 12.0–15.0)
Lymphocytes Relative: 39.9 % (ref 12.0–46.0)
Lymphs Abs: 1.3 10*3/uL (ref 0.7–4.0)
MCHC: 31.8 g/dL (ref 30.0–36.0)
MCV: 82.6 fl (ref 78.0–100.0)
Monocytes Absolute: 0.3 10*3/uL (ref 0.1–1.0)
Monocytes Relative: 9.1 % (ref 3.0–12.0)
Neutro Abs: 1.6 10*3/uL (ref 1.4–7.7)
Neutrophils Relative %: 48.4 % (ref 43.0–77.0)
Platelets: 274 10*3/uL (ref 150.0–400.0)
RBC: 4.31 Mil/uL (ref 3.87–5.11)
RDW: 16.2 % — ABNORMAL HIGH (ref 11.5–15.5)
WBC: 3.4 10*3/uL — ABNORMAL LOW (ref 4.0–10.5)

## 2017-07-02 LAB — BASIC METABOLIC PANEL
BUN: 17 mg/dL (ref 6–23)
CO2: 27 mEq/L (ref 19–32)
Calcium: 9.4 mg/dL (ref 8.4–10.5)
Chloride: 103 mEq/L (ref 96–112)
Creatinine, Ser: 1.03 mg/dL (ref 0.40–1.20)
GFR: 67.66 mL/min (ref >60.00)
Glucose, Bld: 85 mg/dL (ref 70–99)
Potassium: 4.2 mEq/L (ref 3.5–5.1)
Sodium: 138 mEq/L (ref 135–145)

## 2017-07-02 LAB — MICROALBUMIN / CREATININE URINE RATIO
Creatinine,U: 126.2 mg/dL
Microalb Creat Ratio: 0.6 mg/g (ref 0.0–30.0)
Microalb, Ur: <0.7 mg/dL (ref 0.0–1.9)

## 2017-07-02 LAB — LIPID PANEL
Cholesterol: 140 mg/dL (ref 0–200)
HDL: 61.8 mg/dL (ref >39.00)
LDL Cholesterol: 62 mg/dL (ref 0–99)
NonHDL: 78.27
Total CHOL/HDL Ratio: 2
Triglycerides: 83 mg/dL (ref 0.0–149.0)
VLDL: 16.6 mg/dL (ref 0.0–40.0)

## 2017-07-02 LAB — IBC PANEL
Iron: 45 ug/dL (ref 42–145)
Saturation Ratios: 8.8 % — ABNORMAL LOW (ref 20.0–50.0)
Transferrin: 364 mg/dL — ABNORMAL HIGH (ref 212.0–360.0)

## 2017-07-02 LAB — HEPATIC FUNCTION PANEL
ALT: 29 U/L (ref 0–35)
AST: 28 U/L (ref 0–37)
Albumin: 3.9 g/dL (ref 3.5–5.2)
Alkaline Phosphatase: 110 U/L (ref 39–117)
Bilirubin, Direct: 0 mg/dL (ref 0.0–0.3)
Total Bilirubin: 0.3 mg/dL (ref 0.2–1.2)
Total Protein: 7.7 g/dL (ref 6.0–8.3)

## 2017-07-02 LAB — TSH: TSH: 1.74 u[IU]/mL (ref 0.35–4.50)

## 2017-07-02 LAB — HEMOGLOBIN A1C: Hgb A1c MFr Bld: 6 % (ref 4.6–6.5)

## 2017-07-02 MED ORDER — POLYSACCHARIDE IRON COMPLEX 150 MG PO CAPS: 150.0000 mg | ORAL_CAPSULE | Freq: Every day | ORAL | 0 refills | Status: DC

## 2017-07-02 MED ORDER — ZOSTER VAC RECOMB ADJUVANTED 50 MCG/0.5ML IM SUSR: 0.5000 mL | Freq: Once | INTRAMUSCULAR | 1 refills | Status: AC

## 2017-07-02 NOTE — Progress Notes (Signed)
Subjective:    Patient ID: Daisy Dillon, female    DOB: 1945/08/18, 72 y.o.   MRN: [id number]  HPI  Here for wellness and f/u;  Overall doing ok;  Pt denies Chest pain, worsening SOB, DOE, wheezing, orthopnea, PND, worsening LE edema, palpitations, dizziness or syncope.  Pt denies neurological change such as new headache, facial or extremity weakness.  Pt denies polydipsia, polyuria, or low sugar symptoms. Pt states overall good compliance with treatment and medications, good tolerability, and has been trying to follow appropriate diet.  Pt denies worsening depressive symptoms, suicidal ideation or panic. No fever, night sweats, wt loss, loss of appetite, or other constitutional symptoms.  Pt states good ability with ADL's, has low fall risk, home safety reviewed and adequate, no other significant changes in hearing or vision, and only occasionally active with exercise.  Pt continues to have recurring LBP without change in severity, bowel or bladder change, fever, wt loss,  worsening LE pain/numbness/weakness, gait change or falls.  No other interval hx or new complaint Past Medical History:  Diagnosis Date  . Diabetes mellitus   . Diverticulosis of colon (without mention of hemorrhage)   . Elevated cholesterol   . Fibroid   . Gastritis   . GERD (gastroesophageal reflux disease)   . Hiatal hernia   . Hypertension   . IBS (irritable bowel syndrome)   . Positive ANA (antinuclear antibody) 04/23/2012  . Stricture and stenosis of esophagus 09/03/2005  . Urinary incontinence    Past Surgical History:  Procedure Laterality Date  . ABDOMINAL HYSTERECTOMY  1985  . BACK SURGERY  11/2016  . CHOLECYSTECTOMY    . COLONOSCOPY  12/15/2007   diverticulosis  . ESOPHAGOGASTRODUODENOSCOPY  12/24/2012   normal   . KNEE SURGERY Right    arthroscopic    reports that  has never smoked. she has never used smokeless tobacco. She reports that she does not drink alcohol or use drugs. family history  includes Bipolar disorder in her brother; Breast cancer in her maternal aunt; Diabetes in her brother and mother; Hypertension in her mother; Stroke in her brother and brother. Allergies  Allergen Reactions  . Codeine Phosphate     REACTION: unspecified, nausea, vomiting   Current Outpatient Medications on File Prior to Visit  Medication Sig Dispense Refill  . acetaminophen (TYLENOL) 650 MG CR tablet Take 650 mg by mouth every 8 (eight) hours as needed for pain. Reported on 11/08/2015    . Ascorbic Acid (VITAMIN C) 1000 MG tablet Take 1,000 mg by mouth daily.    Marland Kitchen aspirin 81 MG tablet Take 81 mg by mouth daily.      . bisoprolol-hydrochlorothiazide (ZIAC) 2.5-6.25 MG tablet Take 1 tablet by mouth daily. 90 tablet 1  . Calcium Carbonate-Vitamin D (CALCIUM + D PO) Take by mouth.      . Cholecalciferol (VITAMIN D3) 1000 UNITS CAPS Take 1 capsule by mouth daily.      Marland Kitchen dexlansoprazole (DEXILANT) 60 MG capsule Take 1 capsule (60 mg total) by mouth daily. 90 capsule 3  . dicyclomine (BENTYL) 20 MG tablet Take 1 tablet (20 mg total) 4 (four) times daily -  before meals and at bedtime by mouth. (Patient taking differently: Take 10 mg by mouth 4 (four) times daily -  before meals and at bedtime. ) 120 tablet 11  . EAR WAX REMOVAL DROPS 6.5 % otic solution     . glucose blood (ONE TOUCH ULTRA TEST) test strip USE AS DIRECTED  EVERY DAY Dx code E11.9 50 each 3  . glucose blood (ONETOUCH VERIO) test strip Use to check blood sugars once daily 100 each 3  . glucose blood test strip Use to check blood sugar one time daily 100 each 1  . losartan (COZAAR) 50 MG tablet TAKE 1 TABLET BY MOUTH  DAILY 90 tablet 2  . metFORMIN (GLUCOPHAGE) 500 MG tablet TAKE 1 TABLET BY MOUTH TWO  TIMES DAILY WITH MEALS 180 tablet 2  . Multiple Vitamin (MULTIVITAMIN) tablet Take 1 tablet by mouth daily.      Glory Rosebush DELICA LANCETS 96E MISC Use to check blood sugar 2 times a week .Dx code E11.9 100 each 1  . pioglitazone (ACTOS)  15 MG tablet TAKE 1 TABLET BY MOUTH  DAILY 90 tablet 2  . pravastatin (PRAVACHOL) 20 MG tablet TAKE 1 TABLET BY MOUTH  DAILY 90 tablet 1  . verapamil (CALAN-SR) 240 MG CR tablet TAKE 1 TABLET BY MOUTH AT  BEDTIME 90 tablet 1   No current facility-administered medications on file prior to visit.    Review of Systems Constitutional: Negative for other unusual diaphoresis, sweats, appetite or weight changes HENT: Negative for other worsening hearing loss, ear pain, facial swelling, mouth sores or neck stiffness.   Eyes: Negative for other worsening pain, redness or other visual disturbance.  Respiratory: Negative for other stridor or swelling Cardiovascular: Negative for other palpitations or other chest pain  Gastrointestinal: Negative for worsening diarrhea or loose stools, blood in stool, distention or other pain Genitourinary: Negative for hematuria, flank pain or other change in urine volume.  Musculoskeletal: Negative for myalgias or other joint swelling.  Skin: Negative for other color change, or other wound or worsening drainage.  Neurological: Negative for other syncope or numbness. Hematological: Negative for other adenopathy or swelling Psychiatric/Behavioral: Negative for hallucinations, other worsening agitation, SI, self-injury, or new decreased concentration All other system neg per pt    Objective:   Physical Exam BP 128/84   Pulse 78   Temp 98.6 F (37 C) (Oral)   Ht 5\' 5"  (1.651 m)   Wt 203 lb (92.1 kg)   SpO2 98%   BMI 33.78 kg/m  VS noted,  Constitutional: Pt is oriented to person, place, and time. Appears well-developed and well-nourished, in no significant distress and comfortable Head: Normocephalic and atraumatic  Eyes: Conjunctivae and EOM are normal. Pupils are equal, round, and reactive to light Right Ear: External ear normal without discharge Left Ear: External ear normal without discharge Nose: Nose without discharge or deformity Mouth/Throat:  Oropharynx is without other ulcerations and moist  Neck: Normal range of motion. Neck supple. No JVD present. No tracheal deviation present or significant neck LA or mass Cardiovascular: Normal rate, regular rhythm, normal heart sounds and intact distal pulses.   Pulmonary/Chest: WOB normal and breath sounds without rales or wheezing  Abdominal: Soft. Bowel sounds are normal. NT. No HSM  Musculoskeletal: Normal range of motion. Exhibits no edema Lymphadenopathy: Has no other cervical adenopathy.  Neurological: Pt is alert and oriented to person, place, and time. Pt has normal reflexes. No cranial nerve deficit. Motor grossly intact, Gait intact Skin: Skin is warm and dry. No rash noted or new ulcerations Psychiatric:  Has normal mood and affect. Behavior is normal without agitation No other exam findings     Assessment & Plan:

## 2017-07-02 NOTE — Patient Instructions (Addendum)
Your shingles shot prescription was sent to the pharmacy  Please continue all other medications as before, and refills have been done if requested.  Please have the pharmacy call with any other refills you may need.  Please continue your efforts at being more active, low cholesterol diet, and weight control.  You are otherwise up to date with prevention measures today.  Please keep your appointments with your specialists as you may have planned  Please go to the LAB in the Basement (turn left off the elevator) for the tests to be done today  You will be contacted by phone if any changes need to be made immediately.  Otherwise, you will receive a letter about your results with an explanation, but please check with MyChart first.  Please remember to sign up for MyChart if you have not done so, as this will be important to you in the future with finding out test results, communicating by private email, and scheduling acute appointments online when needed.  Please return in 6 months, or sooner if needed, with Lab testing done 3-5 days before

## 2017-07-03 ENCOUNTER — Telehealth: Payer: Self-pay

## 2017-07-03 DIAGNOSIS — M6281 Muscle weakness (generalized): Secondary | ICD-10-CM | POA: Diagnosis not present

## 2017-07-03 DIAGNOSIS — M545 Low back pain: Secondary | ICD-10-CM | POA: Diagnosis not present

## 2017-07-03 DIAGNOSIS — G894 Chronic pain syndrome: Secondary | ICD-10-CM | POA: Diagnosis not present

## 2017-07-03 NOTE — Telephone Encounter (Signed)
Called pt, she was heading out for an appt. She stated that she would call back for results.   CRM created.

## 2017-07-03 NOTE — Telephone Encounter (Signed)
-----   Message from Biagio Borg, MD sent at 07/02/2017  4:56 PM EST ----- Left message on MyChart, pt to cont same tx except  The test results show that your current treatment is OK, except the tests show a borderline low Iron level.  Please take NuIron 1 per day for 1 month. I will send a prescription, and you should hear from the office as well.Redmond Baseman to please inform pt, I will do rx

## 2017-07-04 NOTE — Assessment & Plan Note (Signed)

## 2017-07-04 NOTE — Assessment & Plan Note (Signed)
stable overall by history and exam, recent data reviewed with pt, and pt to continue medical treatment as before,  to f/u any worsening symptoms or concerns Lab Results  Component Value Date   HGBA1C 6.0 07/02/2017

## 2017-07-04 NOTE — Assessment & Plan Note (Signed)
To cont PT s/p 11/2016 lumbar surgury

## 2017-07-08 DIAGNOSIS — M545 Low back pain: Secondary | ICD-10-CM | POA: Diagnosis not present

## 2017-07-08 DIAGNOSIS — M6281 Muscle weakness (generalized): Secondary | ICD-10-CM | POA: Diagnosis not present

## 2017-07-08 DIAGNOSIS — G894 Chronic pain syndrome: Secondary | ICD-10-CM | POA: Diagnosis not present

## 2017-07-10 DIAGNOSIS — M545 Low back pain: Secondary | ICD-10-CM | POA: Diagnosis not present

## 2017-07-10 DIAGNOSIS — G894 Chronic pain syndrome: Secondary | ICD-10-CM | POA: Diagnosis not present

## 2017-07-10 DIAGNOSIS — M6281 Muscle weakness (generalized): Secondary | ICD-10-CM | POA: Diagnosis not present

## 2017-07-15 DIAGNOSIS — M545 Low back pain: Secondary | ICD-10-CM | POA: Diagnosis not present

## 2017-07-15 DIAGNOSIS — M6281 Muscle weakness (generalized): Secondary | ICD-10-CM | POA: Diagnosis not present

## 2017-07-15 DIAGNOSIS — G894 Chronic pain syndrome: Secondary | ICD-10-CM | POA: Diagnosis not present

## 2017-07-17 DIAGNOSIS — M6281 Muscle weakness (generalized): Secondary | ICD-10-CM | POA: Diagnosis not present

## 2017-07-17 DIAGNOSIS — G894 Chronic pain syndrome: Secondary | ICD-10-CM | POA: Diagnosis not present

## 2017-07-17 DIAGNOSIS — M545 Low back pain: Secondary | ICD-10-CM | POA: Diagnosis not present

## 2017-07-22 DIAGNOSIS — M6281 Muscle weakness (generalized): Secondary | ICD-10-CM | POA: Diagnosis not present

## 2017-07-22 DIAGNOSIS — G894 Chronic pain syndrome: Secondary | ICD-10-CM | POA: Diagnosis not present

## 2017-07-22 DIAGNOSIS — M545 Low back pain: Secondary | ICD-10-CM | POA: Diagnosis not present

## 2017-07-24 DIAGNOSIS — M545 Low back pain: Secondary | ICD-10-CM | POA: Diagnosis not present

## 2017-07-24 DIAGNOSIS — M6281 Muscle weakness (generalized): Secondary | ICD-10-CM | POA: Diagnosis not present

## 2017-07-24 DIAGNOSIS — G894 Chronic pain syndrome: Secondary | ICD-10-CM | POA: Diagnosis not present

## 2017-07-25 ENCOUNTER — Other Ambulatory Visit: Payer: Self-pay | Admitting: Endocrinology

## 2017-07-29 DIAGNOSIS — M6281 Muscle weakness (generalized): Secondary | ICD-10-CM | POA: Diagnosis not present

## 2017-07-29 DIAGNOSIS — G894 Chronic pain syndrome: Secondary | ICD-10-CM | POA: Diagnosis not present

## 2017-07-29 DIAGNOSIS — M545 Low back pain: Secondary | ICD-10-CM | POA: Diagnosis not present

## 2017-07-31 DIAGNOSIS — G894 Chronic pain syndrome: Secondary | ICD-10-CM | POA: Diagnosis not present

## 2017-07-31 DIAGNOSIS — M6281 Muscle weakness (generalized): Secondary | ICD-10-CM | POA: Diagnosis not present

## 2017-07-31 DIAGNOSIS — M545 Low back pain: Secondary | ICD-10-CM | POA: Diagnosis not present

## 2017-08-03 DIAGNOSIS — G894 Chronic pain syndrome: Secondary | ICD-10-CM | POA: Diagnosis not present

## 2017-08-03 DIAGNOSIS — M545 Low back pain: Secondary | ICD-10-CM | POA: Diagnosis not present

## 2017-08-03 DIAGNOSIS — M6281 Muscle weakness (generalized): Secondary | ICD-10-CM | POA: Diagnosis not present

## 2017-08-10 DIAGNOSIS — M545 Low back pain: Secondary | ICD-10-CM | POA: Diagnosis not present

## 2017-08-10 DIAGNOSIS — M6281 Muscle weakness (generalized): Secondary | ICD-10-CM | POA: Diagnosis not present

## 2017-08-10 DIAGNOSIS — G894 Chronic pain syndrome: Secondary | ICD-10-CM | POA: Diagnosis not present

## 2017-08-13 DIAGNOSIS — M6281 Muscle weakness (generalized): Secondary | ICD-10-CM | POA: Diagnosis not present

## 2017-08-13 DIAGNOSIS — M545 Low back pain: Secondary | ICD-10-CM | POA: Diagnosis not present

## 2017-08-13 DIAGNOSIS — G894 Chronic pain syndrome: Secondary | ICD-10-CM | POA: Diagnosis not present

## 2017-08-14 ENCOUNTER — Other Ambulatory Visit: Payer: Self-pay | Admitting: Endocrinology

## 2017-08-17 DIAGNOSIS — M545 Low back pain: Secondary | ICD-10-CM | POA: Diagnosis not present

## 2017-08-17 DIAGNOSIS — G894 Chronic pain syndrome: Secondary | ICD-10-CM | POA: Diagnosis not present

## 2017-08-17 DIAGNOSIS — M6281 Muscle weakness (generalized): Secondary | ICD-10-CM | POA: Diagnosis not present

## 2017-08-19 DIAGNOSIS — G894 Chronic pain syndrome: Secondary | ICD-10-CM | POA: Diagnosis not present

## 2017-08-19 DIAGNOSIS — M545 Low back pain: Secondary | ICD-10-CM | POA: Diagnosis not present

## 2017-08-19 DIAGNOSIS — M6281 Muscle weakness (generalized): Secondary | ICD-10-CM | POA: Diagnosis not present

## 2017-08-26 DIAGNOSIS — G894 Chronic pain syndrome: Secondary | ICD-10-CM | POA: Diagnosis not present

## 2017-08-26 DIAGNOSIS — M6281 Muscle weakness (generalized): Secondary | ICD-10-CM | POA: Diagnosis not present

## 2017-08-26 DIAGNOSIS — M545 Low back pain: Secondary | ICD-10-CM | POA: Diagnosis not present

## 2017-08-31 DIAGNOSIS — M6281 Muscle weakness (generalized): Secondary | ICD-10-CM | POA: Diagnosis not present

## 2017-08-31 DIAGNOSIS — M545 Low back pain: Secondary | ICD-10-CM | POA: Diagnosis not present

## 2017-08-31 DIAGNOSIS — G894 Chronic pain syndrome: Secondary | ICD-10-CM | POA: Diagnosis not present

## 2017-09-01 ENCOUNTER — Other Ambulatory Visit (INDEPENDENT_AMBULATORY_CARE_PROVIDER_SITE_OTHER): Payer: Medicare Other

## 2017-09-01 DIAGNOSIS — E119 Type 2 diabetes mellitus without complications: Secondary | ICD-10-CM

## 2017-09-01 LAB — COMPREHENSIVE METABOLIC PANEL
ALT: 34 U/L (ref 0–35)
AST: 29 U/L (ref 0–37)
Albumin: 3.9 g/dL (ref 3.5–5.2)
Alkaline Phosphatase: 102 U/L (ref 39–117)
BUN: 18 mg/dL (ref 6–23)
CO2: 25 mEq/L (ref 19–32)
Calcium: 9.7 mg/dL (ref 8.4–10.5)
Chloride: 104 mEq/L (ref 96–112)
Creatinine, Ser: 1.1 mg/dL (ref 0.40–1.20)
GFR: 62.69 mL/min (ref >60.00)
Glucose, Bld: 97 mg/dL (ref 70–99)
Potassium: 3.6 mEq/L (ref 3.5–5.1)
Sodium: 138 mEq/L (ref 135–145)
Total Bilirubin: 0.3 mg/dL (ref 0.2–1.2)
Total Protein: 7.6 g/dL (ref 6.0–8.3)

## 2017-09-01 LAB — HEMOGLOBIN A1C: Hgb A1c MFr Bld: 6 % (ref 4.6–6.5)

## 2017-09-01 LAB — MICROALBUMIN / CREATININE URINE RATIO
Creatinine,U: 226 mg/dL
Microalb Creat Ratio: 0.4 mg/g (ref 0.0–30.0)
Microalb, Ur: 0.9 mg/dL (ref 0.0–1.9)

## 2017-09-04 ENCOUNTER — Ambulatory Visit: Payer: Medicare Other | Admitting: Endocrinology

## 2017-09-07 DIAGNOSIS — G894 Chronic pain syndrome: Secondary | ICD-10-CM | POA: Diagnosis not present

## 2017-09-07 DIAGNOSIS — M6281 Muscle weakness (generalized): Secondary | ICD-10-CM | POA: Diagnosis not present

## 2017-09-07 DIAGNOSIS — M545 Low back pain: Secondary | ICD-10-CM | POA: Diagnosis not present

## 2017-09-08 ENCOUNTER — Ambulatory Visit: Payer: Medicare Other | Admitting: Endocrinology

## 2017-09-08 ENCOUNTER — Encounter: Payer: Self-pay | Admitting: Endocrinology

## 2017-09-08 VITALS — BP 124/70 | HR 81 | Ht 65.0 in | Wt 202.0 lb

## 2017-09-08 DIAGNOSIS — E119 Type 2 diabetes mellitus without complications: Secondary | ICD-10-CM | POA: Diagnosis not present

## 2017-09-08 NOTE — Progress Notes (Signed)
Patient ID: Daisy Dillon, female   DOB: 02-10-1946, 72 y.o.   MRN: [id number]   Reason for Appointment: follow-up   History of Present Illness   Diagnosis: Type 2 DIABETES MELITUS, date of diagnosis:   2002   She has had stable control of her mild diabetes with combination of low-doses of Actos and metformin  She usually has had a normal A1c and this stays in the 5.7- 6% range and now 6% again  She has checked her sugar periodically as directed including after meals Her blood sugars are excellent  Again has difficulty losing weight Usually trying to watch portions and avoid high fat foods  Monitors blood glucose: Less than once a day .    Glucometer: One touch ultra mini          Blood Glucose readings from download:  Taking blood sugars before and after meals with a range of 84-138 Overall median 97 vs 115 previously        Physical activity: exercise: Now doing PT for back pain            Complications: are: No history of neuropathy     Wt Readings from Last 3 Encounters:  09/08/17 202 lb (91.6 kg)  07/02/17 203 lb (92.1 kg)  04/27/17 200 lb 4 oz (90.8 kg)    Lab Results  Component Value Date   HGBA1C 6.0 09/01/2017   HGBA1C 6.0 07/02/2017   HGBA1C 5.8 03/03/2017   Lab Results  Component Value Date   MICROALBUR 0.9 09/01/2017   LDLCALC 62 07/02/2017   CREATININE 1.10 09/01/2017    OTHER active problems: See review of systems   No visits with results within 1 Week(s) from this visit.  Latest known visit with results is:  Lab on 09/01/2017  Component Date Value Ref Range Status  . Microalb, Ur 09/01/2017 0.9  0.0 - 1.9 mg/dL Final  . Creatinine,U 09/01/2017 226.0  mg/dL Final  . Microalb Creat Ratio 09/01/2017 0.4  0.0 - 30.0 mg/g Final  . Sodium 09/01/2017 138  135 - 145 mEq/L Final  . Potassium 09/01/2017 3.6  3.5 - 5.1 mEq/L Final  . Chloride 09/01/2017 104  96 - 112 mEq/L Final  . CO2 09/01/2017 25  19 - 32 mEq/L Final  . Glucose, Bld  09/01/2017 97  70 - 99 mg/dL Final  . BUN 09/01/2017 18  6 - 23 mg/dL Final  . Creatinine, Ser 09/01/2017 1.10  0.40 - 1.20 mg/dL Final  . Total Bilirubin 09/01/2017 0.3  0.2 - 1.2 mg/dL Final  . Alkaline Phosphatase 09/01/2017 102  39 - 117 U/L Final  . AST 09/01/2017 29  0 - 37 U/L Final  . ALT 09/01/2017 34  0 - 35 U/L Final  . Total Protein 09/01/2017 7.6  6.0 - 8.3 g/dL Final  . Albumin 09/01/2017 3.9  3.5 - 5.2 g/dL Final  . Calcium 09/01/2017 9.7  8.4 - 10.5 mg/dL Final  . GFR 09/01/2017 62.69  >60.00 mL/min Final  . Hgb A1c MFr Bld 09/01/2017 6.0  4.6 - 6.5 % Final   Glycemic Control Guidelines for People with Diabetes:Non Diabetic:  <6%Goal of Therapy: <7%Additional Action Suggested:  >8%     Allergies as of 09/08/2017      Reactions   Codeine Phosphate    REACTION: unspecified, nausea, vomiting      Medication List        Accurate as of 09/08/17 11:02 AM. Always use your most  recent med list.          acetaminophen 650 MG CR tablet Commonly known as:  TYLENOL Take 650 mg by mouth every 8 (eight) hours as needed for pain. Reported on 11/08/2015   aspirin 81 MG tablet Take 81 mg by mouth daily.   bisoprolol-hydrochlorothiazide 2.5-6.25 MG tablet Commonly known as:  ZIAC TAKE 1 TABLET BY MOUTH  DAILY   CALCIUM + D PO Take by mouth.   dexlansoprazole 60 MG capsule Commonly known as:  DEXILANT Take 1 capsule (60 mg total) by mouth daily.   dicyclomine 20 MG tablet Commonly known as:  BENTYL Take 1 tablet (20 mg total) 4 (four) times daily -  before meals and at bedtime by mouth.   EAR WAX REMOVAL DROPS 6.5 % OTIC solution Generic drug:  carbamide peroxide   glucose blood test strip Commonly known as:  ONE TOUCH ULTRA TEST USE AS DIRECTED EVERY DAY Dx code E11.9   glucose blood test strip Use to check blood sugar one time daily   glucose blood test strip Commonly known as:  ONETOUCH VERIO Use to check blood sugars once daily   losartan 50 MG  tablet Commonly known as:  COZAAR TAKE 1 TABLET BY MOUTH  DAILY   metFORMIN 500 MG tablet Commonly known as:  GLUCOPHAGE TAKE 1 TABLET BY MOUTH TWO  TIMES DAILY WITH MEALS   multivitamin tablet Take 1 tablet by mouth daily.   ONETOUCH DELICA LANCETS 52W Misc Use to check blood sugar 2 times a week .Dx code E11.9   pioglitazone 15 MG tablet Commonly known as:  ACTOS TAKE 1 TABLET BY MOUTH  DAILY   pravastatin 20 MG tablet Commonly known as:  PRAVACHOL TAKE 1 TABLET BY MOUTH  DAILY   verapamil 240 MG CR tablet Commonly known as:  CALAN-SR TAKE 1 TABLET BY MOUTH AT  BEDTIME   vitamin C 1000 MG tablet Take 1,000 mg by mouth daily.   Vitamin D3 1000 units Caps Take 1 capsule by mouth daily.       Allergies:  Allergies  Allergen Reactions  . Codeine Phosphate     REACTION: unspecified, nausea, vomiting    Past Medical History:  Diagnosis Date  . Diabetes mellitus   . Diverticulosis of colon (without mention of hemorrhage)   . Elevated cholesterol   . Fibroid   . Gastritis   . GERD (gastroesophageal reflux disease)   . Hiatal hernia   . Hypertension   . IBS (irritable bowel syndrome)   . Positive ANA (antinuclear antibody) 04/23/2012  . Stricture and stenosis of esophagus 09/03/2005  . Urinary incontinence     Past Surgical History:  Procedure Laterality Date  . ABDOMINAL HYSTERECTOMY  1985  . BACK SURGERY  11/2016  . CHOLECYSTECTOMY    . COLONOSCOPY  12/15/2007   diverticulosis  . ESOPHAGOGASTRODUODENOSCOPY  12/24/2012   normal   . KNEE SURGERY Right    arthroscopic    Family History  Problem Relation Age of Onset  . Diabetes Mother   . Hypertension Mother   . Stroke Brother        x 2  . Bipolar disorder Brother   . Stroke Brother   . Diabetes Brother   . Breast cancer Maternal Aunt        Age 41's  . Colon cancer Neg Hx   . Stomach cancer Neg Hx   . Throat cancer Neg Hx   . Liver disease Neg Hx  Social History:  reports that she  has never smoked. She has never used smokeless tobacco. She reports that she does not drink alcohol or use drugs.  Review of Systems:  HYPERTENSION:   She takes losartan 50 mg, Verapamil 240 mg and Ziac 2.5 Blood pressure is consistently controlled, was also seen by PCP in 1/19  Was previously getting hypokalemia with Dyazide  Home BP relatively higher, recently 137/77   BP Readings from Last 3 Encounters:  09/08/17 124/70  07/02/17 128/84  04/27/17 126/74   Renal functions have been stable along with potassium:  Lab Results  Component Value Date   CREATININE 1.10 09/01/2017   BUN 18 09/01/2017   NA 138 09/01/2017   K 3.6 09/01/2017   CL 104 09/01/2017   CO2 25 09/01/2017     HYPERLIPIDEMIA: The lipid abnormality consists of elevated LDL which is well-controlled on pravastatin 20 mg  Lab Results  Component Value Date   CHOL 140 07/02/2017   HDL 61.80 07/02/2017   LDLCALC 62 07/02/2017   TRIG 83.0 07/02/2017   CHOLHDL 2 07/02/2017           Examination:   BP 124/70 (BP Location: Left Arm, Patient Position: Sitting, Cuff Size: Large)   Pulse 81   Ht 5\' 5"  (1.651 m)   Wt 202 lb (91.6 kg)   SpO2 97%   BMI 33.61 kg/m   Body mass index is 33.61 kg/m.    ASSESSMENT/ PLAN:   Diabetes type 2 with  obesity  Blood sugars are consistently well controlled with A1c again 6 percent  She is on metformin 1000 mg a day and  15 mg Actos and has been on the same regimen long-term  Recently has gained little weight and may not have been consistent with diet over the last 3-4 months However she is starting to do a little more exercise and weight is leveled off Discussed watching portions and calories and snacks to keep from any further weight gain No change in medications recommended Recommended doing physical therapy exercises at home consistently  Hypertension:  well controlled with Ziac, verapamil and losartan 50 mg She will continue the same regimen She can  take her blood pressure monitor to the office for comparison .   Follow-up in 6 months    Daisy Dillon 09/08/2017, 11:02 AM

## 2017-09-14 DIAGNOSIS — M545 Low back pain: Secondary | ICD-10-CM | POA: Diagnosis not present

## 2017-09-14 DIAGNOSIS — M6281 Muscle weakness (generalized): Secondary | ICD-10-CM | POA: Diagnosis not present

## 2017-09-14 DIAGNOSIS — G894 Chronic pain syndrome: Secondary | ICD-10-CM | POA: Diagnosis not present

## 2017-09-16 ENCOUNTER — Other Ambulatory Visit: Payer: Self-pay | Admitting: Endocrinology

## 2017-09-24 ENCOUNTER — Other Ambulatory Visit: Payer: Self-pay | Admitting: Endocrinology

## 2017-09-24 DIAGNOSIS — M545 Low back pain: Secondary | ICD-10-CM | POA: Diagnosis not present

## 2017-09-24 DIAGNOSIS — M6281 Muscle weakness (generalized): Secondary | ICD-10-CM | POA: Diagnosis not present

## 2017-09-24 DIAGNOSIS — G894 Chronic pain syndrome: Secondary | ICD-10-CM | POA: Diagnosis not present

## 2017-10-09 ENCOUNTER — Ambulatory Visit (INDEPENDENT_AMBULATORY_CARE_PROVIDER_SITE_OTHER): Payer: Medicare Other | Admitting: Internal Medicine

## 2017-10-09 ENCOUNTER — Encounter: Payer: Self-pay | Admitting: Internal Medicine

## 2017-10-09 ENCOUNTER — Ambulatory Visit: Payer: Self-pay | Admitting: *Deleted

## 2017-10-09 VITALS — BP 126/82 | HR 92 | Temp 98.5°F | Ht 65.0 in | Wt 202.0 lb

## 2017-10-09 DIAGNOSIS — J309 Allergic rhinitis, unspecified: Secondary | ICD-10-CM | POA: Diagnosis not present

## 2017-10-09 DIAGNOSIS — I10 Essential (primary) hypertension: Secondary | ICD-10-CM | POA: Diagnosis not present

## 2017-10-09 DIAGNOSIS — R05 Cough: Secondary | ICD-10-CM

## 2017-10-09 DIAGNOSIS — E119 Type 2 diabetes mellitus without complications: Secondary | ICD-10-CM | POA: Diagnosis not present

## 2017-10-09 DIAGNOSIS — R059 Cough, unspecified: Secondary | ICD-10-CM | POA: Insufficient documentation

## 2017-10-09 MED ORDER — AZITHROMYCIN 250 MG PO TABS: ORAL_TABLET | ORAL | 1 refills | Status: DC

## 2017-10-09 MED ORDER — HYDROCODONE-HOMATROPINE 5-1.5 MG/5ML PO SYRP: 5.0000 mL | ORAL_SOLUTION | Freq: Four times a day (QID) | ORAL | 0 refills | Status: AC | PRN

## 2017-10-09 MED ORDER — METHYLPREDNISOLONE ACETATE 80 MG/ML IJ SUSP
80.0000 mg | Freq: Once | INTRAMUSCULAR | Status: AC
  Administered 2017-10-09: 80 mg via INTRAMUSCULAR

## 2017-10-09 NOTE — Progress Notes (Signed)
Subjective:    Patient ID: Daisy Dillon, female    DOB: 1946/01/02, 72 y.o.   MRN: [id number]  HPI  Here with acute onset mild to mod 2-3 days ST, HA, general weakness and malaise, with prod cough greenish sputum, but Pt denies chest pain, increased sob or doe, wheezing, orthopnea, PND, increased LE swelling, palpitations, dizziness or syncope.  Does have several wks ongoing nasal allergy symptoms with clearish congestion, itch and sneezing, without fever, pain, ST, cough, swelling or wheezing.   Pt denies polydipsia, polyuria Past Medical History:  Diagnosis Date  . Diabetes mellitus   . Diverticulosis of colon (without mention of hemorrhage)   . Elevated cholesterol   . Fibroid   . Gastritis   . GERD (gastroesophageal reflux disease)   . Hiatal hernia   . Hypertension   . IBS (irritable bowel syndrome)   . Positive ANA (antinuclear antibody) 04/23/2012  . Stricture and stenosis of esophagus 09/03/2005  . Urinary incontinence    Past Surgical History:  Procedure Laterality Date  . ABDOMINAL HYSTERECTOMY  1985  . BACK SURGERY  11/2016  . CHOLECYSTECTOMY    . COLONOSCOPY  12/15/2007   diverticulosis  . ESOPHAGOGASTRODUODENOSCOPY  12/24/2012   normal   . KNEE SURGERY Right    arthroscopic    reports that she has never smoked. She has never used smokeless tobacco. She reports that she does not drink alcohol or use drugs. family history includes Bipolar disorder in her brother; Breast cancer in her maternal aunt; Diabetes in her brother and mother; Hypertension in her mother; Stroke in her brother and brother. Allergies  Allergen Reactions  . Codeine Phosphate     REACTION: unspecified, nausea, vomiting   Current Outpatient Medications on File Prior to Visit  Medication Sig Dispense Refill  . acetaminophen (TYLENOL) 650 MG CR tablet Take 650 mg by mouth every 8 (eight) hours as needed for pain. Reported on 11/08/2015    . Ascorbic Acid (VITAMIN C) 1000 MG tablet Take 1,000 mg  by mouth daily.    Marland Kitchen aspirin 81 MG tablet Take 81 mg by mouth daily.      . bisoprolol-hydrochlorothiazide (ZIAC) 2.5-6.25 MG tablet TAKE 1 TABLET BY MOUTH  DAILY 90 tablet 1  . Calcium Carbonate-Vitamin D (CALCIUM + D PO) Take by mouth.      . Cholecalciferol (VITAMIN D3) 1000 UNITS CAPS Take 1 capsule by mouth daily.      Marland Kitchen dexlansoprazole (DEXILANT) 60 MG capsule Take 1 capsule (60 mg total) by mouth daily. 90 capsule 3  . dicyclomine (BENTYL) 20 MG tablet Take 1 tablet (20 mg total) 4 (four) times daily -  before meals and at bedtime by mouth. (Patient taking differently: Take 10 mg by mouth 4 (four) times daily -  before meals and at bedtime. ) 120 tablet 11  . EAR WAX REMOVAL DROPS 6.5 % otic solution     . glucose blood (ONE TOUCH ULTRA TEST) test strip USE AS DIRECTED EVERY DAY Dx code E11.9 50 each 3  . glucose blood (ONETOUCH VERIO) test strip Use to check blood sugars once daily 100 each 3  . glucose blood test strip Use to check blood sugar one time daily 100 each 1  . losartan (COZAAR) 50 MG tablet TAKE 1 TABLET BY MOUTH  DAILY 90 tablet 2  . metFORMIN (GLUCOPHAGE) 500 MG tablet TAKE 1 TABLET BY MOUTH TWO  TIMES DAILY WITH MEALS 180 tablet 2  . Multiple Vitamin (MULTIVITAMIN)  tablet Take 1 tablet by mouth daily.      Glory Rosebush DELICA LANCETS 19Q MISC Use to check blood sugar 2 times a week .Dx code E11.9 100 each 1  . pioglitazone (ACTOS) 15 MG tablet TAKE 1 TABLET BY MOUTH  DAILY 90 tablet 2  . pravastatin (PRAVACHOL) 20 MG tablet TAKE 1 TABLET BY MOUTH  DAILY 90 tablet 1  . verapamil (CALAN-SR) 240 MG CR tablet TAKE 1 TABLET BY MOUTH AT  BEDTIME 90 tablet 1   No current facility-administered medications on file prior to visit.    Review of Systems  Constitutional: Negative for other unusual diaphoresis or sweats HENT: Negative for ear discharge or swelling Eyes: Negative for other worsening visual disturbances Respiratory: Negative for stridor or other swelling    Gastrointestinal: Negative for worsening distension or other blood Genitourinary: Negative for retention or other urinary change Musculoskeletal: Negative for other MSK pain or swelling Skin: Negative for color change or other new lesions Neurological: Negative for worsening tremors and other numbness  Psychiatric/Behavioral: Negative for worsening agitation or other fatigue All other system neg per pt    Objective:   Physical Exam BP 126/82   Pulse 92   Temp 98.5 F (36.9 C) (Oral)   Ht 5\' 5"  (1.651 m)   Wt 202 lb (91.6 kg)   SpO2 98%   BMI 33.61 kg/m  VS noted, mild ill appearing Constitutional: Pt appears in NAD HENT: Head: NCAT.  Right Ear: External ear normal.  Left Ear: External ear normal.  Eyes: . Pupils are equal, round, and reactive to light. Conjunctivae and EOM are normal Bilat tm's with mild erythema.  Max sinus areas non tender.  Pharynx with mild erythema, no exudate Nose: without d/c or deformity Neck: Neck supple. Gross normal ROM Cardiovascular: Normal rate and regular rhythm.   Pulmonary/Chest: Effort normal and breath sounds decreased  without rales or wheezing.  Abd:  Soft, NT, ND, + BS, no organomegaly Neurological: Pt is alert. At baseline orientation, motor grossly intact Skin: Skin is warm. No rashes, other new lesions, no LE edema Psychiatric: Pt behavior is normal without agitation  No other exam findings    Assessment & Plan:

## 2017-10-09 NOTE — Patient Instructions (Signed)
You had the steroid shot today  Please take all new medication as prescribed  - the antibiotic, and cough medicine if needed  Please also take your home Antihistamine (or OTC Zyrtec) for allergies  Consider taking Nasacort OTC as well if not better  Please continue all other medications as before, and refills have been done if requested.  Please have the pharmacy call with any other refills you may need.  Please keep your appointments with your specialists as you may have planned

## 2017-10-09 NOTE — Assessment & Plan Note (Signed)
stable overall by history and exam, recent data reviewed with pt, and pt to continue medical treatment as before,  to f/u any worsening symptoms or concerns Lab Results  Component Value Date   HGBA1C 6.0 09/01/2017

## 2017-10-09 NOTE — Assessment & Plan Note (Signed)
stable overall by history and exam, recent data reviewed with pt, and pt to continue medical treatment as before,  to f/u any worsening symptoms or concerns BP Readings from Last 3 Encounters:  10/09/17 126/82  09/08/17 124/70  07/02/17 128/84

## 2017-10-09 NOTE — Assessment & Plan Note (Signed)
With seasonal flare, for depomedrol IM 80, predpac asd, zyrtec prn,  to f/u any worsening symptoms or concerns

## 2017-10-09 NOTE — Telephone Encounter (Signed)
Patient has had a cough approximately 3 weeks. She stated very little is coming up.She has not checked her temperature but feels she has had fever, intermittently with this cough. Denies all other symptoms. She has not tried any home care other than lozenges. Offered home care advice. She would like to be seen today. Appointment made.  Reason for Disposition . Cough  Answer Assessment - Initial Assessment Questions 1. ONSET: "When did the cough begin?"      Cough for about 3 weeks 2. SEVERITY: "How bad is the cough today?"     Coughing spells with very little phlegm 3. RESPIRATORY DISTRESS: "Describe your breathing."    no 4. FEVER: "Do you have a fever?" If so, ask: "What is your temperature, how was it measured, and when did it start?"    She feels like she has some fever on and off.  5. HEMOPTYSIS: "Are you coughing up any blood?" If so ask: "How much?" (flecks, streaks, tablespoons, etc.)    no 6. TREATMENT: "What have you done so far to treat the cough?" (e.g., meds, fluids, humidifier)   nothing 7. CARDIAC HISTORY: "Do you have any history of heart disease?" (e.g., heart attack, congestive heart failure)     htn 8. LUNG HISTORY: "Do you have any history of lung disease?"  (e.g., pulmonary embolus, asthma, emphysema)   no 9. PE RISK FACTORS: "Do you have a history of blood clots?" (or: recent major surgery, recent prolonged travel, bedridden )   no 10. OTHER SYMPTOMS: "Do you have any other symptoms? (e.g., runny nose, wheezing, chest pain)    no 11. PREGNANCY: "Is there any chance you are pregnant?" "When was your last menstrual period?"    no 12. TRAVEL: "Have you traveled out of the country in the last month?" (e.g., travel history, exposures)    no  Protocols used: COUGH - ACUTE NON-PRODUCTIVE-A-AH

## 2017-10-09 NOTE — Telephone Encounter (Signed)
Noted  

## 2017-10-09 NOTE — Assessment & Plan Note (Signed)
Mild to mod, c/w bronchitis vs pna, decliens cxr, for antibx course, cough med prn, to f/u any worsening symptoms or concerns

## 2017-10-12 ENCOUNTER — Ambulatory Visit: Payer: Medicare Other | Admitting: Internal Medicine

## 2017-10-27 ENCOUNTER — Other Ambulatory Visit: Payer: Self-pay | Admitting: Endocrinology

## 2017-11-06 ENCOUNTER — Encounter: Payer: Self-pay | Admitting: Gastroenterology

## 2017-11-10 ENCOUNTER — Other Ambulatory Visit: Payer: Self-pay

## 2017-11-10 MED ORDER — DICYCLOMINE HCL 20 MG PO TABS: 20.0000 mg | ORAL_TABLET | Freq: Three times a day (TID) | ORAL | 1 refills | Status: DC

## 2017-11-25 DIAGNOSIS — M545 Low back pain: Secondary | ICD-10-CM | POA: Diagnosis not present

## 2017-11-25 DIAGNOSIS — M47896 Other spondylosis, lumbar region: Secondary | ICD-10-CM | POA: Diagnosis not present

## 2017-11-25 DIAGNOSIS — M4326 Fusion of spine, lumbar region: Secondary | ICD-10-CM | POA: Diagnosis not present

## 2017-11-25 DIAGNOSIS — M4716 Other spondylosis with myelopathy, lumbar region: Secondary | ICD-10-CM | POA: Diagnosis not present

## 2017-11-27 ENCOUNTER — Other Ambulatory Visit: Payer: Self-pay | Admitting: Endocrinology

## 2017-12-07 ENCOUNTER — Encounter: Payer: Self-pay | Admitting: Gastroenterology

## 2017-12-23 ENCOUNTER — Ambulatory Visit (INDEPENDENT_AMBULATORY_CARE_PROVIDER_SITE_OTHER): Payer: Medicare Other | Admitting: Internal Medicine

## 2017-12-23 ENCOUNTER — Ambulatory Visit (INDEPENDENT_AMBULATORY_CARE_PROVIDER_SITE_OTHER)
Admission: RE | Admit: 2017-12-23 | Discharge: 2017-12-23 | Disposition: A | Discharge status: Home / Self Care | Payer: Medicare Other | Source: Ambulatory Visit | Attending: Internal Medicine | Admitting: Internal Medicine

## 2017-12-23 ENCOUNTER — Encounter: Payer: Self-pay | Admitting: Internal Medicine

## 2017-12-23 VITALS — BP 136/88 | HR 86 | Temp 99.0°F | Ht 65.0 in | Wt 203.0 lb

## 2017-12-23 DIAGNOSIS — I10 Essential (primary) hypertension: Secondary | ICD-10-CM

## 2017-12-23 DIAGNOSIS — E785 Hyperlipidemia, unspecified: Secondary | ICD-10-CM | POA: Diagnosis not present

## 2017-12-23 DIAGNOSIS — E119 Type 2 diabetes mellitus without complications: Secondary | ICD-10-CM

## 2017-12-23 DIAGNOSIS — R059 Cough, unspecified: Secondary | ICD-10-CM

## 2017-12-23 DIAGNOSIS — Z0000 Encounter for general adult medical examination without abnormal findings: Secondary | ICD-10-CM

## 2017-12-23 DIAGNOSIS — R05 Cough: Secondary | ICD-10-CM

## 2017-12-23 NOTE — Assessment & Plan Note (Signed)
stable overall by history and exam, recent data reviewed with pt, and pt to continue medical treatment as before,  to f/u any worsening symptoms or concerns Lab Results  Component Value Date   LDLCALC 62 07/02/2017

## 2017-12-23 NOTE — Patient Instructions (Signed)
Please continue all other medications as before, and refills have been done if requested.  Please have the pharmacy call with any other refills you may need.  Please continue your efforts at being more active, low cholesterol diet, and weight control.  Please keep your appointments with your specialists as you may have planned  Please go to the XRAY Department in the Basement (go straight as you get off the elevator) for the x-ray testing  You will be contacted by phone if any changes need to be made immediately.  Otherwise, you will receive a letter about your results with an explanation, but please check with MyChart first.  Please remember to sign up for MyChart if you have not done so, as this will be important to you in the future with finding out test results, communicating by private email, and scheduling acute appointments online when needed.  Please return in 6 months, or sooner if needed, with Lab testing done 3-5 days before  

## 2017-12-23 NOTE — Assessment & Plan Note (Signed)
stable overall by history and exam, recent data reviewed with pt, and pt to continue medical treatment as before,  to f/u any worsening symptoms or concerns BP Readings from Last 3 Encounters:  12/23/17 136/88  10/09/17 126/82  09/08/17 124/70

## 2017-12-23 NOTE — Assessment & Plan Note (Signed)
stable overall by history and exam, recent data reviewed with pt, and pt to continue medical treatment as before,  to f/u any worsening symptoms or concerns  

## 2017-12-23 NOTE — Progress Notes (Signed)
Subjective:    Patient ID: Daisy Dillon, female    DOB: March 23, 1946, 72 y.o.   MRN: [id number]  HPI  Here to f/u; overall doing ok,  Pt denies chest pain, increasing sob or doe, wheezing, orthopnea, PND, increased LE swelling, palpitations, dizziness or syncope, but does have persistent 2 mo non prod cough not improved with otc med for allergy.  Pt denies new neurological symptoms such as new headache, or facial or extremity weakness or numbness.  Pt denies polydipsia, polyuria, or low sugar episode.  Pt states overall good compliance with meds, mostly trying to follow appropriate diet, with wt overall stable,  but little exercise however.  Past Medical History:  Diagnosis Date  . Diabetes mellitus   . Diverticulosis of colon (without mention of hemorrhage)   . Elevated cholesterol   . Fibroid   . Gastritis   . GERD (gastroesophageal reflux disease)   . Hiatal hernia   . Hypertension   . IBS (irritable bowel syndrome)   . Positive ANA (antinuclear antibody) 04/23/2012  . Stricture and stenosis of esophagus 09/03/2005  . Urinary incontinence    Past Surgical History:  Procedure Laterality Date  . ABDOMINAL HYSTERECTOMY  1985  . BACK SURGERY  11/2016  . CHOLECYSTECTOMY    . COLONOSCOPY  12/15/2007   diverticulosis  . ESOPHAGOGASTRODUODENOSCOPY  12/24/2012   normal   . KNEE SURGERY Right    arthroscopic    reports that she has never smoked. She has never used smokeless tobacco. She reports that she does not drink alcohol or use drugs. family history includes Bipolar disorder in her brother; Breast cancer in her maternal aunt; Diabetes in her brother and mother; Hypertension in her mother; Stroke in her brother and brother. Allergies  Allergen Reactions  . Codeine Phosphate     REACTION: unspecified, nausea, vomiting   Current Outpatient Medications on File Prior to Visit  Medication Sig Dispense Refill  . acetaminophen (TYLENOL) 650 MG CR tablet Take 650 mg by mouth every 8  (eight) hours as needed for pain. Reported on 11/08/2015    . Ascorbic Acid (VITAMIN C) 1000 MG tablet Take 1,000 mg by mouth daily.    Marland Kitchen aspirin 81 MG tablet Take 81 mg by mouth daily.      . bisoprolol-hydrochlorothiazide (ZIAC) 2.5-6.25 MG tablet TAKE 1 TABLET BY MOUTH  DAILY 90 tablet 1  . Calcium Carbonate-Vitamin D (CALCIUM + D PO) Take by mouth.      . Cholecalciferol (VITAMIN D3) 1000 UNITS CAPS Take 1 capsule by mouth daily.      Marland Kitchen dexlansoprazole (DEXILANT) 60 MG capsule Take 1 capsule (60 mg total) by mouth daily. 90 capsule 3  . dicyclomine (BENTYL) 20 MG tablet Take 1 tablet (20 mg total) by mouth 4 (four) times daily -  before meals and at bedtime. 270 tablet 1  . EAR WAX REMOVAL DROPS 6.5 % otic solution     . glucose blood (ONE TOUCH ULTRA TEST) test strip USE AS DIRECTED EVERY DAY Dx code E11.9 50 each 3  . glucose blood (ONETOUCH VERIO) test strip Use to check blood sugars once daily 100 each 3  . glucose blood test strip Use to check blood sugar one time daily 100 each 1  . losartan (COZAAR) 50 MG tablet TAKE 1 TABLET BY MOUTH  DAILY 90 tablet 0  . metFORMIN (GLUCOPHAGE) 500 MG tablet TAKE 1 TABLET BY MOUTH TWO  TIMES DAILY WITH MEALS 180 tablet 2  .  Multiple Vitamin (MULTIVITAMIN) tablet Take 1 tablet by mouth daily.      Glory Rosebush DELICA LANCETS 11H MISC Use to check blood sugar 2 times a week .Dx code E11.9 100 each 1  . pioglitazone (ACTOS) 15 MG tablet TAKE 1 TABLET BY MOUTH  DAILY 90 tablet 2  . pravastatin (PRAVACHOL) 20 MG tablet TAKE 1 TABLET BY MOUTH  DAILY 90 tablet 1  . verapamil (CALAN-SR) 240 MG CR tablet TAKE 1 TABLET BY MOUTH AT  BEDTIME 90 tablet 1   No current facility-administered medications on file prior to visit.    Review of Systems  Constitutional: Negative for other unusual diaphoresis or sweats HENT: Negative for ear discharge or swelling Eyes: Negative for other worsening visual disturbances Respiratory: Negative for stridor or other swelling    Gastrointestinal: Negative for worsening distension or other blood Genitourinary: Negative for retention or other urinary change Musculoskeletal: Negative for other MSK pain or swelling Skin: Negative for color change or other new lesions Neurological: Negative for worsening tremors and other numbness  Psychiatric/Behavioral: Negative for worsening agitation or other fatigue All other system neg per pt    Objective:   Physical Exam BP 136/88   Pulse 86   Temp 99 F (37.2 C) (Oral)   Ht 5\' 5"  (1.651 m)   Wt 203 lb (92.1 kg)   SpO2 96%   BMI 33.78 kg/m  VS noted,  Constitutional: Pt appears in NAD HENT: Head: NCAT.  Right Ear: External ear normal.  Left Ear: External ear normal.  Eyes: . Pupils are equal, round, and reactive to light. Conjunctivae and EOM are normal Nose: without d/c or deformity Neck: Neck supple. Gross normal ROM Cardiovascular: Normal rate and regular rhythm.   Pulmonary/Chest: Effort normal and breath sounds without rales or wheezing.  Abd:  Soft, NT, ND, + BS, no organomegaly Neurological: Pt is alert. At baseline orientation, motor grossly intact Skin: Skin is warm. No rashes, other new lesions, no LE edema Psychiatric: Pt behavior is normal without agitation  No other exam findings Lab Results  Component Value Date   WBC 3.4 (L) 07/02/2017   HGB 11.3 (L) 07/02/2017   HCT 35.6 (L) 07/02/2017   PLT 274.0 07/02/2017   GLUCOSE 97 09/01/2017   CHOL 140 07/02/2017   TRIG 83.0 07/02/2017   HDL 61.80 07/02/2017   LDLCALC 62 07/02/2017   ALT 34 09/01/2017   AST 29 09/01/2017   NA 138 09/01/2017   K 3.6 09/01/2017   CL 104 09/01/2017   CREATININE 1.10 09/01/2017   BUN 18 09/01/2017   CO2 25 09/01/2017   TSH 1.74 07/02/2017   INR 1.22 11/02/2015   HGBA1C 6.0 09/01/2017   MICROALBUR 0.9 09/01/2017       Assessment & Plan:

## 2017-12-23 NOTE — Assessment & Plan Note (Signed)
Etiology unclear, for cxr , cont same tx

## 2017-12-30 ENCOUNTER — Ambulatory Visit: Payer: Medicare Other | Admitting: Internal Medicine

## 2018-01-11 ENCOUNTER — Other Ambulatory Visit: Payer: Self-pay | Admitting: Gynecology

## 2018-01-11 DIAGNOSIS — Z1231 Encounter for screening mammogram for malignant neoplasm of breast: Secondary | ICD-10-CM

## 2018-01-20 ENCOUNTER — Other Ambulatory Visit: Payer: Self-pay | Admitting: Endocrinology

## 2018-01-26 DIAGNOSIS — M545 Low back pain: Secondary | ICD-10-CM | POA: Diagnosis not present

## 2018-01-26 DIAGNOSIS — M4316 Spondylolisthesis, lumbar region: Secondary | ICD-10-CM | POA: Diagnosis not present

## 2018-01-26 DIAGNOSIS — M47896 Other spondylosis, lumbar region: Secondary | ICD-10-CM | POA: Diagnosis not present

## 2018-02-03 ENCOUNTER — Ambulatory Visit (AMBULATORY_SURGERY_CENTER): Payer: Self-pay | Admitting: *Deleted

## 2018-02-03 ENCOUNTER — Encounter: Payer: Self-pay | Admitting: Gastroenterology

## 2018-02-03 VITALS — Ht 65.0 in | Wt 199.0 lb

## 2018-02-03 DIAGNOSIS — Z1211 Encounter for screening for malignant neoplasm of colon: Secondary | ICD-10-CM

## 2018-02-03 MED ORDER — NA SULFATE-K SULFATE-MG SULF 17.5-3.13-1.6 GM/177ML PO SOLN: 1.0000 | Freq: Once | ORAL | 0 refills | Status: AC

## 2018-02-03 NOTE — Progress Notes (Signed)
No egg or soy allergy known to patient  No issues with past sedation with any surgeries  or procedures, no intubation problems  No diet pills per patient No home 02 use per patient  No blood thinners per patient  Pt denies issues with constipation  No A fib or A flutter  EMMI video sent to pt's e mail  

## 2018-02-04 ENCOUNTER — Ambulatory Visit: Payer: Self-pay | Admitting: Internal Medicine

## 2018-02-04 ENCOUNTER — Telehealth: Payer: Self-pay | Admitting: Gastroenterology

## 2018-02-04 NOTE — Telephone Encounter (Signed)
[place] for mucinex otc bid prn, but if she likes we can refer to pulmonary

## 2018-02-04 NOTE — Telephone Encounter (Signed)
I returned her call.   She is c/o of a non productive cough that is not resolving.   It's better but still there.   It feels like I have mucus stuck in my throat that I can't get up.  She has been seen by Dr. Jenny Reichmann for coughing 12/23/17 and also back in 10/09/17 for coughing.   She completed 2 rounds of antibiotics.   Been using OTC cough medication without much relief.    "I still have coughing spells".    I routed a note to Dr. Jenny Reichmann letting him know of continued cough and "mucus stuck in her throat" feeling.      I let pt know someone from the office will be in contact with her to let her know if he will call in something for her or if he feels she needs to be seen again.  I let her know it may be tomorrow before someone gets back in touch with her.  She was agreeable to this plan. Reason for Disposition . Cough has been present for > 3 weeks    Has been seen twice for this and completed 2 courses of antibiotics with some improvement but cough still has not resolved.  Answer Assessment - Initial Assessment Questions 1. ONSET: "When did the cough begin?"      I saw Dr. Jenny Reichmann 12/23/17.   Was given an antibiotic but it gave me diarrhea but I did finish all 6 pills.   I'm not coughing as much.   I used OTC cough medicine but I'm still coughing.   I have mucus that stays in my throat. 2. SEVERITY: "How bad is the cough today?"      It's better but not as bad and as often. 3. RESPIRATORY DISTRESS: "Describe your breathing."      No shortness of breath. 4. FEVER: "Do you have a fever?" If so, ask: "What is your temperature, how was it measured, and when did it start?"     No 5. HEMOPTYSIS: "Are you coughing up any blood?" If so ask: "How much?" (flecks, streaks, tablespoons, etc.)     No blood 6. TREATMENT: "What have you done so far to treat the cough?" (e.g., meds, fluids, humidifier)     Had 2 courses of antibiotics.    I've been using OTC cough medicine but it has not helped much.   I still feel  like I have mucus in my throat.   Not coughing up anything.   It feels stuck in my throat.  I've seen him twice about this recently.   7. CARDIAC HISTORY: "Do you have any history of heart disease?" (e.g., heart attack, congestive heart failure)      Not asked due to recently being evaluated by Dr. Jenny Reichmann for same symptoms that are not resolving completely. 8. LUNG HISTORY: "Do you have any history of lung disease?"  (e.g., pulmonary embolus, asthma, emphysema)     See above under cardiac history. 9. PE RISK FACTORS: "Do you have a history of blood clots?" (or: recent major surgery, recent prolonged travel, bedridden)     Not asked.   See above statement. 10. OTHER SYMPTOMS: "Do you have any other symptoms? (e.g., runny nose, wheezing, chest pain)       Denies any other symptoms except the cough. 11. PREGNANCY: "Is there any chance you are pregnant?" "When was your last menstrual period?"       Not asked due to age 72. TRAVEL: "Have you  traveled out of the country in the last month?" (e.g., travel history, exposures)       Not asked.  Protocols used: COUGH - ACUTE NON-PRODUCTIVE-A-AH

## 2018-02-04 NOTE — Telephone Encounter (Signed)
Pt calling states with [place] is 102.00 and she cannot Afford this- pt will pick up a sample of Suprep 3rd floor desk in the next day or 2-- Marijean Niemann   Lot [id number]  EXp 07/21 and as directed

## 2018-02-05 NOTE — Telephone Encounter (Signed)
Spoke with pt. States she will try mucinex, if that does not help she will let us know to enter referral.

## 2018-02-07 ENCOUNTER — Other Ambulatory Visit: Payer: Self-pay

## 2018-02-07 ENCOUNTER — Emergency Department (HOSPITAL_COMMUNITY): Payer: Medicare Other

## 2018-02-07 ENCOUNTER — Encounter (HOSPITAL_COMMUNITY): Payer: Self-pay | Admitting: Emergency Medicine

## 2018-02-07 ENCOUNTER — Emergency Department (HOSPITAL_COMMUNITY)
Admission: EM | Admit: 2018-02-07 | Discharge: 2018-02-07 | Disposition: A | Discharge status: Home / Self Care | Payer: Medicare Other | Attending: Emergency Medicine | Admitting: Emergency Medicine

## 2018-02-07 DIAGNOSIS — Z7982 Long term (current) use of aspirin: Secondary | ICD-10-CM | POA: Insufficient documentation

## 2018-02-07 DIAGNOSIS — E119 Type 2 diabetes mellitus without complications: Secondary | ICD-10-CM | POA: Insufficient documentation

## 2018-02-07 DIAGNOSIS — Z79899 Other long term (current) drug therapy: Secondary | ICD-10-CM | POA: Insufficient documentation

## 2018-02-07 DIAGNOSIS — I509 Heart failure, unspecified: Secondary | ICD-10-CM | POA: Insufficient documentation

## 2018-02-07 DIAGNOSIS — Z7984 Long term (current) use of oral hypoglycemic drugs: Secondary | ICD-10-CM | POA: Diagnosis not present

## 2018-02-07 DIAGNOSIS — R42 Dizziness and giddiness: Secondary | ICD-10-CM | POA: Insufficient documentation

## 2018-02-07 DIAGNOSIS — I119 Hypertensive heart disease without heart failure: Secondary | ICD-10-CM | POA: Diagnosis not present

## 2018-02-07 LAB — APTT: aPTT: 31 seconds (ref 24–36)

## 2018-02-07 LAB — CBC
HCT: 39.3 % (ref 36.0–46.0)
Hemoglobin: 12.6 g/dL (ref 12.0–15.0)
MCH: 27.8 pg (ref 26.0–34.0)
MCHC: 32.1 g/dL (ref 30.0–36.0)
MCV: 86.8 fL (ref 78.0–100.0)
Platelets: 239 10*3/uL (ref 150–400)
RBC: 4.53 MIL/uL (ref 3.87–5.11)
RDW: 15.7 % — ABNORMAL HIGH (ref 11.5–15.5)
WBC: 4.3 10*3/uL (ref 4.0–10.5)

## 2018-02-07 LAB — URINALYSIS, ROUTINE W REFLEX MICROSCOPIC
Bilirubin Urine: NEGATIVE
Glucose, UA: NEGATIVE mg/dL
Hgb urine dipstick: NEGATIVE
Ketones, ur: NEGATIVE mg/dL
Leukocytes, UA: NEGATIVE
Nitrite: NEGATIVE
Protein, ur: NEGATIVE mg/dL
Specific Gravity, Urine: 1.02 (ref 1.005–1.030)
pH: 5 (ref 5.0–8.0)

## 2018-02-07 LAB — PROTIME-INR
INR: 1.12
Prothrombin Time: 14.3 seconds (ref 11.4–15.2)

## 2018-02-07 LAB — BASIC METABOLIC PANEL
Anion gap: 11 (ref 5–15)
BUN: 19 mg/dL (ref 8–23)
CO2: 22 mmol/L (ref 22–32)
Calcium: 9.4 mg/dL (ref 8.9–10.3)
Chloride: 106 mmol/L (ref 98–111)
Creatinine, Ser: 1.16 mg/dL — ABNORMAL HIGH (ref 0.44–1.00)
GFR calc Af Amer: 53 mL/min — ABNORMAL LOW (ref >60)
GFR calc non Af Amer: 46 mL/min — ABNORMAL LOW (ref >60)
Glucose, Bld: 122 mg/dL — ABNORMAL HIGH (ref 70–99)
Potassium: 3.6 mmol/L (ref 3.5–5.1)
Sodium: 139 mmol/L (ref 135–145)

## 2018-02-07 LAB — RAPID URINE DRUG SCREEN, HOSP PERFORMED
Amphetamines: NOT DETECTED
Barbiturates: NOT DETECTED
Benzodiazepines: NOT DETECTED
Cocaine: NOT DETECTED
Opiates: NOT DETECTED
Tetrahydrocannabinol: NOT DETECTED

## 2018-02-07 LAB — I-STAT TROPONIN, ED: Troponin i, poc: 0.01 ng/mL (ref 0.00–0.08)

## 2018-02-07 LAB — CBG MONITORING, ED: Glucose-Capillary: 115 mg/dL — ABNORMAL HIGH (ref 70–99)

## 2018-02-07 MED ORDER — MECLIZINE HCL 12.5 MG PO TABS: 12.5000 mg | ORAL_TABLET | Freq: Three times a day (TID) | ORAL | 0 refills | Status: DC | PRN

## 2018-02-07 MED ORDER — SODIUM CHLORIDE 0.9 % IV BOLUS
500.0000 mL | Freq: Once | INTRAVENOUS | Status: AC
  Administered 2018-02-07: 500 mL via INTRAVENOUS

## 2018-02-07 MED ORDER — SODIUM CHLORIDE 0.9 % IV SOLN
100.0000 mL/h | INTRAVENOUS | Status: DC
  Administered 2018-02-07: 100 mL/h via INTRAVENOUS

## 2018-02-07 NOTE — Discharge Instructions (Signed)
As discussed, your evaluation today has been largely reassuring.  But, it is important that you monitor your condition carefully, and do not hesitate to return to the ED if you develop new, or concerning changes in your condition.  Your dizziness is likely due to vertigo.  Please use the prescribed medication for relief, as needed.  Otherwise, please follow-up with your physician and our neurologist for appropriate ongoing care.

## 2018-02-07 NOTE — ED Notes (Signed)
Walked with patient to the restroom, pt denied dizziness while ambulating to and from restroom.

## 2018-02-07 NOTE — ED Provider Notes (Signed)
[facility] EMERGENCY DEPARTMENT Provider Note   CSN: [id number] Arrival date & time: 02/07/18  8527     History   Chief Complaint Chief Complaint  Patient presents with  . Dizziness    HPI Daisy Dillon is a 72 y.o. female.  HPI Elderly female presents with acute onset of dizziness. She has history of hypertension, diabetes, hypercholesterolemia, no history of stroke, nor arrhythmia. She is here with her son who assists with the HPI. She was in her usual state of health last night, and immediately upon awakening, but soon thereafter noticed dizziness, worse with ambulation, upright positioning. Symptoms described as unsteadiness, not near syncope. No concurrent weakness in her extremities, no confusion, vision change, difficulty speaking. Symptoms been persistent since onset, with no clear alleviating factors She confirms no recent medication change, diet change, activity change.   Past Medical History:  Diagnosis Date  . Anemia    long ago  . Arthritis   . Diabetes mellitus   . Diverticulosis of colon (without mention of hemorrhage)   . Elevated cholesterol   . Fibroid   . Gastritis   . GERD (gastroesophageal reflux disease)   . Hiatal hernia   . Hypertension   . IBS (irritable bowel syndrome)   . Positive ANA (antinuclear antibody) 04/23/2012  . Stricture and stenosis of esophagus 09/03/2005  . Urinary incontinence     Patient Active Problem List   Diagnosis Date Noted  . Cough 10/09/2017  . Allergic rhinitis 10/09/2017  . Paresthesia 11/08/2015  . Bilateral hearing loss 01/25/2015  . Left otitis media 01/25/2015  . Lower back pain 04/26/2013  . Pain in toe of left foot 11/11/2012  . Positive ANA (antinuclear antibody) 04/23/2012  . Preventative health care 04/22/2012  . Bilateral hand pain 04/22/2012  . Hypertension   . Fibroid   . Urinary incontinence   . Hiatal hernia   . GERD with stricture 06/19/2011  . Globus sensation  06/19/2011  . DIVERTICULOSIS, COLON 12/30/2007  . HYPERTENSIVE CARDIOVASCULAR DISEASE 10/28/2007  . GASTRITIS 10/28/2007  . HIATAL HERNIA 10/28/2007  . DEGENERATIVE JOINT DISEASE 10/28/2007  . Diabetes ([place]) 06/08/2007  . Hyperlipidemia 06/08/2007  . ANXIETY 06/08/2007  . ESOPHAGEAL STRICTURE 06/08/2007  . IBS 06/08/2007  . Other tenosynovitis of hand and wrist 06/08/2007    Past Surgical History:  Procedure Laterality Date  . ABDOMINAL HYSTERECTOMY  1985  . BACK SURGERY  11/2016  . CHOLECYSTECTOMY    . COLONOSCOPY  12/15/2007   diverticulosis  . ESOPHAGOGASTRODUODENOSCOPY  12/24/2012   normal   . KNEE SURGERY Right    arthroscopic  . UPPER GASTROINTESTINAL ENDOSCOPY       OB History    Gravida  2   Para  2   Term  2   Preterm      AB      Living  2     SAB      TAB      Ectopic      Multiple      Live Births               Home Medications    Prior to Admission medications   Medication Sig Start Date End Date Taking? Authorizing Provider  Ascorbic Acid (VITAMIN C) 1000 MG tablet Take 1,000 mg by mouth daily.   Yes [provider]  aspirin 81 MG tablet Take 81 mg by mouth daily.     Yes [provider]  bisoprolol-hydrochlorothiazide [facility])  2.5-6.25 MG tablet TAKE 1 TABLET BY MOUTH  DAILY 01/20/18  Yes Elayne Snare, MD  Cholecalciferol (VITAMIN D3) 1000 UNITS CAPS Take 1 capsule by mouth daily.     Yes [provider]  dexlansoprazole (DEXILANT) 60 MG capsule Take 1 capsule (60 mg total) by mouth daily. 06/25/17  Yes Ladene Artist, MD  dicyclomine (BENTYL) 20 MG tablet Take 1 tablet (20 mg total) by mouth 4 (four) times daily -  before meals and at bedtime. 11/10/17  Yes Ladene Artist, MD  losartan (COZAAR) 50 MG tablet TAKE 1 TABLET BY MOUTH  DAILY Patient taking differently: Take 50 mg by mouth daily.  11/30/17  Yes Elayne Snare, MD  metFORMIN (GLUCOPHAGE) 500 MG tablet TAKE 1 TABLET BY MOUTH TWO  TIMES DAILY WITH  MEALS Patient taking differently: Take 500 mg by mouth 2 (two) times daily with a meal.  10/27/17  Yes Elayne Snare, MD  Multiple Vitamin (MULTIVITAMIN) tablet Take 1 tablet by mouth daily.     Yes [provider]  pioglitazone (ACTOS) 15 MG tablet TAKE 1 TABLET BY MOUTH  DAILY Patient taking differently: Take 15 mg by mouth daily.  09/24/17  Yes Elayne Snare, MD  pravastatin (PRAVACHOL) 20 MG tablet TAKE 1 TABLET BY MOUTH  DAILY Patient taking differently: Take 20 mg by mouth daily.  01/20/18  Yes Elayne Snare, MD  verapamil (CALAN-SR) 240 MG CR tablet TAKE 1 TABLET BY MOUTH AT  BEDTIME Patient taking differently: Take 240 mg by mouth at bedtime.  01/20/18  Yes Elayne Snare, MD  glucose blood (ONE TOUCH ULTRA TEST) test strip USE AS DIRECTED EVERY DAY Dx code E11.9 07/11/16   Elayne Snare, MD  glucose blood (ONETOUCH VERIO) test strip Use to check blood sugars once daily 03/13/17   Elayne Snare, MD  glucose blood test strip Use to check blood sugar one time daily 03/06/17   Elayne Snare, MD  meclizine (ANTIVERT) 12.5 MG tablet Take 1 tablet (12.5 mg total) by mouth 3 (three) times daily as needed for dizziness. 02/07/18   Carmin Muskrat, MD  [facility] DELICA LANCETS 52D MISC Use to check blood sugar 2 times a week .Dx code E11.9 03/06/16   Elayne Snare, MD    Family History Family History  Problem Relation Age of Onset  . Diabetes Mother   . Hypertension Mother   . Stroke Brother        x 2  . Bipolar disorder Brother   . Stroke Brother   . Diabetes Brother   . Breast cancer Maternal Aunt        Age 32's  . Colon cancer Neg Hx   . Stomach cancer Neg Hx   . Throat cancer Neg Hx   . Liver disease Neg Hx   . Colon polyps Neg Hx   . Esophageal cancer Neg Hx   . Rectal cancer Neg Hx     Social History Social History   Tobacco Use  . Smoking status: Never Smoker  . Smokeless tobacco: Never Used  Substance Use Topics  . Alcohol use: No    Alcohol/week: 0.0 standard drinks  . Drug use:  No     Allergies   Codeine phosphate   Review of Systems Review of Systems  Constitutional:       Per HPI, otherwise negative  HENT:       Per HPI, otherwise negative  Respiratory:       Per HPI, otherwise negative  Cardiovascular:  Per HPI, otherwise negative  Gastrointestinal: Negative for vomiting.  Endocrine:       Negative aside from HPI  Genitourinary:       Neg aside from HPI   Musculoskeletal:       Per HPI, otherwise negative  Skin: Negative.   Neurological: Positive for dizziness. Negative for syncope.     Physical Exam Updated Vital Signs BP (!) 156/83   Pulse 80   Temp 98.3 F (36.8 C) (Oral)   Resp 17   SpO2 99%   Physical Exam  Constitutional: She is oriented to person, place, and time. She appears well-developed and well-nourished. No distress.  HENT:  Head: Normocephalic and atraumatic.  Eyes: Conjunctivae and EOM are normal.  Cardiovascular: Normal rate and regular rhythm.  Pulmonary/Chest: Effort normal and breath sounds normal. No stridor. No respiratory distress.  Abdominal: She exhibits no distension.  Musculoskeletal: She exhibits no edema.  Neurological: She is alert and oriented to person, place, and time. She displays no atrophy and no tremor. No cranial nerve deficit or sensory deficit. She exhibits normal muscle tone. She displays no seizure activity.  Slow, determined gait  Skin: Skin is warm and dry.  Psychiatric: She has a normal mood and affect.  Nursing note and vitals reviewed.    ED Treatments / Results  Labs (all labs ordered are listed, but only abnormal results are displayed) Labs Reviewed  BASIC METABOLIC PANEL - Abnormal; Notable for the following components:      Result Value   Glucose, Bld 122 (*)    Creatinine, Ser 1.16 (*)    GFR calc non Af Amer 46 (*)    GFR calc Af Amer 53 (*)    All other components within normal limits  CBC - Abnormal; Notable for the following components:   RDW 15.7 (*)    All  other components within normal limits  CBG MONITORING, ED - Abnormal; Notable for the following components:   Glucose-Capillary 115 (*)    All other components within normal limits  URINALYSIS, ROUTINE W REFLEX MICROSCOPIC  PROTIME-INR  APTT  RAPID URINE DRUG SCREEN, HOSP PERFORMED  CBG MONITORING, ED  I-STAT TROPONIN, ED    EKG EKG Interpretation  Date/Time:  Sunday February 07 2018 07:45:21 EDT Ventricular Rate:  76 PR Interval:  196 QRS Duration: 76 QT Interval:  390 QTC Calculation: 438 R Axis:   27 Text Interpretation:  Normal sinus rhythm Anterior infarct , age undetermined   Abnormal ECG   Radiology Ct Head Wo Contrast  Result Date: 02/07/2018 CLINICAL DATA:  Dizziness. EXAM: CT HEAD WITHOUT CONTRAST TECHNIQUE: Contiguous axial images were obtained from the base of the skull through the vertex without intravenous contrast. COMPARISON:  CT scan of Nov 02, 2015. FINDINGS: Brain: No evidence of acute infarction, hemorrhage, hydrocephalus, extra-axial collection or mass lesion/mass effect. Vascular: No hyperdense vessel or unexpected calcification. Skull: Normal. Negative for fracture or focal lesion. Sinuses/Orbits: No acute finding. Other: None. IMPRESSION: Normal head CT. Electronically Signed   By: Marijo Conception, M.D.   On: 02/07/2018 09:06    Procedures Procedures (including critical care time)  Medications Ordered in ED Medications  sodium chloride 0.9 % bolus 500 mL (500 mLs Intravenous New Bag/Given 02/07/18 1031)    Followed by  0.9 %  sodium chloride infusion (100 mL/hr Intravenous New Bag/Given 02/07/18 1032)     Initial Impression / Assessment and Plan / ED Course  I have reviewed the triage vital signs and the  nursing notes.  Pertinent labs & imaging results that were available during my care of the patient were reviewed by me and considered in my medical decision making (see chart for details).     11:07 AM On repeat exam the patient is awake and  alert, states that she feels better lab findings, CT results, possibility of occult stroke, but likelihood of vertigo contributing to her dizziness. Given her improvement here, patient is appropriate for discharge, will follow-up with both primary care and neurology, start meclizine as needed.   Final Clinical Impressions(s) / ED Diagnoses   Final diagnoses:  Dizziness    ED Discharge Orders         Ordered    meclizine (ANTIVERT) 12.5 MG tablet  3 times daily PRN     02/07/18 1106           Carmin Muskrat, MD 02/07/18 1108

## 2018-02-07 NOTE — ED Triage Notes (Signed)
Pt. Stated, I woke up this morning all swimmy headed. Never happened before.

## 2018-02-08 ENCOUNTER — Ambulatory Visit: Payer: Medicare Other

## 2018-02-11 ENCOUNTER — Ambulatory Visit (INDEPENDENT_AMBULATORY_CARE_PROVIDER_SITE_OTHER): Payer: Medicare Other | Admitting: Internal Medicine

## 2018-02-11 ENCOUNTER — Encounter: Payer: Self-pay | Admitting: Internal Medicine

## 2018-02-11 DIAGNOSIS — R42 Dizziness and giddiness: Secondary | ICD-10-CM | POA: Diagnosis not present

## 2018-02-11 DIAGNOSIS — E119 Type 2 diabetes mellitus without complications: Secondary | ICD-10-CM | POA: Diagnosis not present

## 2018-02-11 DIAGNOSIS — I10 Essential (primary) hypertension: Secondary | ICD-10-CM | POA: Diagnosis not present

## 2018-02-11 NOTE — Progress Notes (Signed)
Subjective:    Patient ID: Daisy Dillon, female    DOB: 26-Apr-1946, 72 y.o.   MRN: [id number]  HPI  Here to f/u recent onset vertigo like dizziness, acute onset, mild to mod, seen at ED with neg routine labs and CT head, improved with IVF's and able for d/c home with meclizine.  Pt had small symptoms yesterday with position change, but none today.  Denies HA, fever, sinus congestion, ST, cough, hearing loss, tinnitus or ear pain.  Pt denies chest pain, increased sob or doe, wheezing, orthopnea, PND, increased LE swelling, palpitations, dizziness or syncope.  Pt denies new neurological symptoms such as new headache, or facial or extremity weakness or numbness   Pt denies polydipsia, polyuria, Has been referred to Neurology for f/u as well with next avail appt in October, and she is not sure she wants to go Past Medical History:  Diagnosis Date  . Anemia    long ago  . Arthritis   . Diabetes mellitus   . Diverticulosis of colon (without mention of hemorrhage)   . Elevated cholesterol   . Fibroid   . Gastritis   . GERD (gastroesophageal reflux disease)   . Hiatal hernia   . Hypertension   . IBS (irritable bowel syndrome)   . Positive ANA (antinuclear antibody) 04/23/2012  . Stricture and stenosis of esophagus 09/03/2005  . Urinary incontinence    Past Surgical History:  Procedure Laterality Date  . ABDOMINAL HYSTERECTOMY  1985  . BACK SURGERY  11/2016  . CHOLECYSTECTOMY    . COLONOSCOPY  12/15/2007   diverticulosis  . ESOPHAGOGASTRODUODENOSCOPY  12/24/2012   normal   . KNEE SURGERY Right    arthroscopic  . UPPER GASTROINTESTINAL ENDOSCOPY      reports that she has never smoked. She has never used smokeless tobacco. She reports that she does not drink alcohol or use drugs. family history includes Bipolar disorder in her brother; Breast cancer in her maternal aunt; Diabetes in her brother and mother; Hypertension in her mother; Stroke in her brother and brother. Allergies    Allergen Reactions  . Codeine Phosphate     REACTION: unspecified, nausea, vomiting   Current Outpatient Medications on File Prior to Visit  Medication Sig Dispense Refill  . Ascorbic Acid (VITAMIN C) 1000 MG tablet Take 1,000 mg by mouth daily.    Marland Kitchen aspirin 81 MG tablet Take 81 mg by mouth daily.      . bisoprolol-hydrochlorothiazide (ZIAC) 2.5-6.25 MG tablet TAKE 1 TABLET BY MOUTH  DAILY 90 tablet 1  . Cholecalciferol (VITAMIN D3) 1000 UNITS CAPS Take 1 capsule by mouth daily.      Marland Kitchen dexlansoprazole (DEXILANT) 60 MG capsule Take 1 capsule (60 mg total) by mouth daily. 90 capsule 3  . dicyclomine (BENTYL) 20 MG tablet Take 1 tablet (20 mg total) by mouth 4 (four) times daily -  before meals and at bedtime. 270 tablet 1  . glucose blood (ONE TOUCH ULTRA TEST) test strip USE AS DIRECTED EVERY DAY Dx code E11.9 50 each 3  . glucose blood (ONETOUCH VERIO) test strip Use to check blood sugars once daily 100 each 3  . glucose blood test strip Use to check blood sugar one time daily 100 each 1  . losartan (COZAAR) 50 MG tablet TAKE 1 TABLET BY MOUTH  DAILY (Patient taking differently: Take 50 mg by mouth daily. ) 90 tablet 0  . meclizine (ANTIVERT) 12.5 MG tablet Take 1 tablet (12.5 mg total) by  mouth 3 (three) times daily as needed for dizziness. 21 tablet 0  . metFORMIN (GLUCOPHAGE) 500 MG tablet TAKE 1 TABLET BY MOUTH TWO  TIMES DAILY WITH MEALS (Patient taking differently: Take 500 mg by mouth 2 (two) times daily with a meal. ) 180 tablet 2  . Multiple Vitamin (MULTIVITAMIN) tablet Take 1 tablet by mouth daily.      Gl[facility] DELICA LANCETS 09N MISC Use to check blood sugar 2 times a week .Dx code E11.9 100 each 1  . pioglitazone (ACTOS) 15 MG tablet TAKE 1 TABLET BY MOUTH  DAILY (Patient taking differently: Take 15 mg by mouth daily. ) 90 tablet 2  . pravastatin (PRAVACHOL) 20 MG tablet TAKE 1 TABLET BY MOUTH  DAILY (Patient taking differently: Take 20 mg by mouth daily. ) 90 tablet 1  .  verapamil (CALAN-SR) 240 MG CR tablet TAKE 1 TABLET BY MOUTH AT  BEDTIME (Patient taking differently: Take 240 mg by mouth at bedtime. ) 90 tablet 1   No current facility-administered medications on file prior to visit.    Review of Systems  Constitutional: Negative for other unusual diaphoresis or sweats HENT: Negative for ear discharge or swelling Eyes: Negative for other worsening visual disturbances Respiratory: Negative for stridor or other swelling  Gastrointestinal: Negative for worsening distension or other blood Genitourinary: Negative for retention or other urinary change Musculoskeletal: Negative for other MSK pain or swelling Skin: Negative for color change or other new lesions Neurological: Negative for worsening tremors and other numbness  Psychiatric/Behavioral: Negative for worsening agitation or other fatigue All other system neg per pt    Objective:   Physical Exam BP 140/84   Pulse 88   Temp 98.7 F (37.1 C) (Oral)   Ht 5\' 5"  (1.651 m)   Wt 201 lb (91.2 kg)   SpO2 97%   BMI 33.45 kg/m  VS noted,  Constitutional: Pt appears in NAD HENT: Head: NCAT.  Right Ear: External ear normal.  Left Ear: External ear normal.  Eyes: . Pupils are equal, round, and reactive to light. Conjunctivae and EOM are normal Nose: without d/c or deformity Neck: Neck supple. Gross normal ROM Cardiovascular: Normal rate and regular rhythm.   Pulmonary/Chest: Effort normal and breath sounds without rales or wheezing.  Abd:  Soft, NT, ND, + BS, no organomegaly Neurological: Pt is alert. At baseline orientation, motor grossly intact, cn 2-12 intact Skin: Skin is warm. No rashes, other new lesions, no LE edema Psychiatric: Pt behavior is normal without agitation  No other exam findings Lab Results  Component Value Date   WBC 4.3 02/07/2018   HGB 12.6 02/07/2018   HCT 39.3 02/07/2018   PLT 239 02/07/2018   GLUCOSE 122 (H) 02/07/2018   CHOL 140 07/02/2017   TRIG 83.0 07/02/2017    HDL 61.80 07/02/2017   LDLCALC 62 07/02/2017   ALT 34 09/01/2017   AST 29 09/01/2017   NA 139 02/07/2018   K 3.6 02/07/2018   CL 106 02/07/2018   CREATININE 1.16 (H) 02/07/2018   BUN 19 02/07/2018   CO2 22 02/07/2018   TSH 1.74 07/02/2017   INR 1.12 02/07/2018   HGBA1C 6.0 09/01/2017   MICROALBUR 0.9 09/01/2017       Assessment & Plan:

## 2018-02-11 NOTE — Patient Instructions (Signed)
Please continue all other medications as before, and call for refills of the meclizine if needed  If you have no further symptoms, you may consider cancelling the neurology appointment for october  Please have the pharmacy call with any other refills you may need.  Please continue your efforts at being more active, low cholesterol diet, and weight control.  Please keep your appointments with your specialists as you may have planned

## 2018-02-11 NOTE — Assessment & Plan Note (Signed)
stable overall by history and exam, recent data reviewed with pt, and pt to continue medical treatment as before,  to f/u any worsening symptoms or concerns BP Readings from Last 3 Encounters:  02/11/18 140/84  02/07/18 (!) 159/143  12/23/17 136/88

## 2018-02-11 NOTE — Assessment & Plan Note (Signed)
Lab Results  Component Value Date   HGBA1C 6.0 09/01/2017  stable overall by history and exam, recent data reviewed with pt, and pt to continue medical treatment as before,  to f/u any worsening symptoms or concerns

## 2018-02-11 NOTE — Assessment & Plan Note (Signed)
Seems resolved this am, most c/w BPPV, exam benign, CT head neg, will hold on MRI, to cont meclizine as needed, consider cancel neurology if no recurrence

## 2018-02-17 ENCOUNTER — Ambulatory Visit (AMBULATORY_SURGERY_CENTER): Payer: Medicare Other | Admitting: Gastroenterology

## 2018-02-17 ENCOUNTER — Encounter: Payer: Self-pay | Admitting: Gastroenterology

## 2018-02-17 VITALS — BP 121/63 | HR 62 | Temp 95.1°F | Resp 15 | Ht 65.0 in | Wt 199.0 lb

## 2018-02-17 DIAGNOSIS — K635 Polyp of colon: Secondary | ICD-10-CM

## 2018-02-17 DIAGNOSIS — Z1211 Encounter for screening for malignant neoplasm of colon: Secondary | ICD-10-CM

## 2018-02-17 DIAGNOSIS — D125 Benign neoplasm of sigmoid colon: Secondary | ICD-10-CM | POA: Diagnosis not present

## 2018-02-17 MED ORDER — SODIUM CHLORIDE 0.9 % IV SOLN: 500.0000 mL | Freq: Once | INTRAVENOUS | Status: DC

## 2018-02-17 NOTE — Progress Notes (Signed)
Called to room to assist during endoscopic procedure.  Patient ID and intended procedure confirmed with present staff. Received instructions for my participation in the procedure from the performing physician.  

## 2018-02-17 NOTE — Patient Instructions (Signed)
**   Handouts given on polyps, diverticulosis, and hemorrhoids **   YOU HAD AN ENDOSCOPIC PROCEDURE TODAY AT THE [facility]:   Refer to the procedure report that was given to you for any specific questions about what was found during the examination.  If the procedure report does not answer your questions, please call your gastroenterologist to clarify.  If you requested that your care partner not be given the details of your procedure findings, then the procedure report has been included in a sealed envelope for you to review at your convenience later.  YOU SHOULD EXPECT: Some feelings of bloating in the abdomen. Passage of more gas than usual.  Walking can help get rid of the air that was put into your GI tract during the procedure and reduce the bloating. If you had a lower endoscopy (such as a colonoscopy or flexible sigmoidoscopy) you may notice spotting of blood in your stool or on the toilet paper. If you underwent a bowel prep for your procedure, you may not have a normal bowel movement for a few days.  Please Note:  You might notice some irritation and congestion in your nose or some drainage.  This is from the oxygen used during your procedure.  There is no need for concern and it should clear up in a day or so.  SYMPTOMS TO REPORT IMMEDIATELY:   Following lower endoscopy (colonoscopy or flexible sigmoidoscopy):  Excessive amounts of blood in the stool  Significant tenderness or worsening of abdominal pains  Swelling of the abdomen that is new, acute  Fever of 100F or higher  For urgent or emergent issues, a gastroenterologist can be reached at any hour by calling [phone].   DIET:  We do recommend a small meal at first, but then you may proceed to your regular diet.  Drink plenty of fluids but you should avoid alcoholic beverages for 24 hours.  ACTIVITY:  You should plan to take it easy for the rest of today and you should NOT DRIVE or use heavy machinery until  tomorrow (because of the sedation medicines used during the test).    FOLLOW UP: Our staff will call the number listed on your records the next business day following your procedure to check on you and address any questions or concerns that you may have regarding the information given to you following your procedure. If we do not reach you, we will leave a message.  However, if you are feeling well and you are not experiencing any problems, there is no need to return our call.  We will assume that you have returned to your regular daily activities without incident.  If any biopsies were taken you will be contacted by phone or by letter within the next 1-3 weeks.  Please call us at [phone] if you have not heard about the biopsies in 3 weeks.    SIGNATURES/CONFIDENTIALITY: You and/or your care partner have signed paperwork which will be entered into your electronic medical record.  These signatures attest to the fact that that the information above on your After Visit Summary has been reviewed and is understood.  Full responsibility of the confidentiality of this discharge information lies with you and/or your care-partner. 

## 2018-02-17 NOTE — Op Note (Signed)
[facility] Patient Name: Daisy Dillon Procedure Date: 02/17/2018 8:41 AM MRN: [id number] Endoscopist: Ladene Artist , MD Age: 72 Referring MD:  Date of Birth: 11-09-1945 Gender: Female Account #: [id number] Procedure:                Colonoscopy Indications:              Screening for colorectal malignant neoplasm Medicines:                Monitored Anesthesia Care Procedure:                Pre-Anesthesia Assessment:                           - Prior to the procedure, a History and Physical                            was performed, and patient medications and                            allergies were reviewed. The patient's tolerance of                            previous anesthesia was also reviewed. The risks                            and benefits of the procedure and the sedation                            options and risks were discussed with the patient.                            All questions were answered, and informed consent                            was obtained. Prior Anticoagulants: The patient has                            taken no previous anticoagulant or antiplatelet                            agents. ASA Grade Assessment: II - A patient with                            mild systemic disease. After reviewing the risks                            and benefits, the patient was deemed in                            satisfactory condition to undergo the procedure.                           After obtaining informed consent, the colonoscope  was passed under direct vision. Throughout the                            procedure, the patient's blood pressure, pulse, and                            oxygen saturations were monitored continuously. The                            Colonoscope was introduced through the anus and                            advanced to the the cecum, identified by                            appendiceal orifice and  ileocecal valve. The                            ileocecal valve, appendiceal orifice, and rectum                            were photographed. The quality of the bowel                            preparation was good. The colonoscopy was performed                            without difficulty. The patient tolerated the                            procedure well. Scope In: 8:57:16 AM Scope Out: 9:12:33 AM Scope Withdrawal Time: 0 hours 12 minutes 34 seconds  Total Procedure Duration: 0 hours 15 minutes 17 seconds  Findings:                 The perianal and digital rectal examinations were                            normal.                           Two sessile polyps were found in the sigmoid colon.                            The polyps were 5 to 6 mm in size. These polyps                            were removed with a cold snare. Resection and                            retrieval were complete.                           Multiple small-mouthed diverticula were found in  the left colon. There was no evidence of                            diverticular bleeding.                           Internal hemorrhoids were found during                            retroflexion. The hemorrhoids were small and Grade                            I (internal hemorrhoids that do not prolapse).                           The exam was otherwise without abnormality on                            direct and retroflexion views. Complications:            No immediate complications. Estimated blood loss:                            None. Estimated Blood Loss:     Estimated blood loss: none. Impression:               - Two 5 to 6 mm polyps in the sigmoid colon,                            removed with a cold snare. Resected and retrieved.                           - Moderate diverticulosis in the left colon. There                            was no evidence of diverticular bleeding.                            - Internal hemorrhoids.                           - The examination was otherwise normal on direct                            and retroflexion views. Recommendation:           - Repeat colonoscopy in 5 years for surveillance if                            polyp(s) precancerous, otherwise 10 years.                           - Patient has a contact number available for                            emergencies. The signs and symptoms of potential  delayed complications were discussed with the                            patient. Return to normal activities tomorrow.                            Written discharge instructions were provided to the                            patient.                           - High fiber diet.                           - Continue present medications.                           - Await pathology results. Ladene Artist, MD 02/17/2018 9:15:13 AM This report has been signed electronically.

## 2018-02-17 NOTE — Progress Notes (Signed)
To PACU, VSS. Report to [email or web address] 

## 2018-02-17 NOTE — Progress Notes (Signed)
Pt's states no medical or surgical changes since previsit or office visit. 

## 2018-02-18 ENCOUNTER — Telehealth: Payer: Self-pay | Admitting: *Deleted

## 2018-02-18 ENCOUNTER — Telehealth: Payer: Self-pay

## 2018-02-18 NOTE — Telephone Encounter (Signed)
First follow up call attempt.  Answering machine with no descriptors.  No message left.

## 2018-02-18 NOTE — Telephone Encounter (Signed)
  Follow up Call-  Call back number 02/17/2018  Post procedure Call Back phone  # [phone]  Permission to leave phone message Yes  Some recent data might be hidden     Patient questions:  Do you have a fever, pain , or abdominal swelling? No. Pain Score  0 *  Have you tolerated food without any problems? Yes.    Have you been able to return to your normal activities? Yes.    Do you have any questions about your discharge instructions: Diet   No. Medications  No. Follow up visit  No.  Do you have questions or concerns about your Care? No.  Actions: * If pain score is 4 or above: No action needed, pain <4.

## 2018-02-26 ENCOUNTER — Encounter: Payer: Self-pay | Admitting: Gastroenterology

## 2018-03-08 ENCOUNTER — Other Ambulatory Visit (INDEPENDENT_AMBULATORY_CARE_PROVIDER_SITE_OTHER): Payer: Medicare Other

## 2018-03-08 DIAGNOSIS — E119 Type 2 diabetes mellitus without complications: Secondary | ICD-10-CM

## 2018-03-08 LAB — BASIC METABOLIC PANEL
BUN: 20 mg/dL (ref 6–23)
CO2: 27 mEq/L (ref 19–32)
Calcium: 9.5 mg/dL (ref 8.4–10.5)
Chloride: 106 mEq/L (ref 96–112)
Creatinine, Ser: 1.11 mg/dL (ref 0.40–1.20)
GFR: 61.95 mL/min (ref >60.00)
Glucose, Bld: 95 mg/dL (ref 70–99)
Potassium: 3.7 mEq/L (ref 3.5–5.1)
Sodium: 140 mEq/L (ref 135–145)

## 2018-03-08 LAB — HEMOGLOBIN A1C: Hgb A1c MFr Bld: 5.9 % (ref 4.6–6.5)

## 2018-03-11 ENCOUNTER — Ambulatory Visit: Payer: Medicare Other | Admitting: Endocrinology

## 2018-03-11 ENCOUNTER — Encounter: Payer: Self-pay | Admitting: Endocrinology

## 2018-03-11 VITALS — BP 118/64 | HR 86 | Ht 65.0 in | Wt 199.0 lb

## 2018-03-11 DIAGNOSIS — E119 Type 2 diabetes mellitus without complications: Secondary | ICD-10-CM | POA: Diagnosis not present

## 2018-03-11 NOTE — Progress Notes (Signed)
Patient ID: Daisy Dillon, female   DOB: 23-Dec-1945, 72 y.o.   MRN: [id number]   Reason for Appointment: follow-up   History of Present Illness   Diagnosis: Type 2 DIABETES MELITUS, date of diagnosis:   2002   She has had stable long-term control of her mild diabetes with combination of low-doses of Actos and metformin  She usually has had a normal A1c and this stays in the 5.7- 6% range and now 5.9  Even though she has not been able to exercise she appears to have lost 3 pounds since her last visit She has checked her sugar before and after meals and blood sugars are usually the same more minimally higher after meals compared to pre-meal blood sugar Average blood sugar is only about 100 at home Usually trying to watch portions and avoid high fat foods  Monitors blood glucose: Less than once a day .    Glucometer: One touch ultra mini          Blood Glucose readings from download:  Blood sugar range 78-127, MEDIAN 98        Physical activity: exercise:  none, she is sometimes will do chair exercises           Complications: are: No history of neuropathy     Wt Readings from Last 3 Encounters:  03/11/18 199 lb (90.3 kg)  02/17/18 199 lb (90.3 kg)  02/11/18 201 lb (91.2 kg)    Lab Results  Component Value Date   HGBA1C 5.9 03/08/2018   HGBA1C 6.0 09/01/2017   HGBA1C 6.0 07/02/2017   Lab Results  Component Value Date   MICROALBUR 0.9 09/01/2017   LDLCALC 62 07/02/2017   CREATININE 1.11 03/08/2018    OTHER active problems: See review of systems   Lab on 03/08/2018  Component Date Value Ref Range Status  . Sodium 03/08/2018 140  135 - 145 mEq/L Final  . Potassium 03/08/2018 3.7  3.5 - 5.1 mEq/L Final  . Chloride 03/08/2018 106  96 - 112 mEq/L Final  . CO2 03/08/2018 27  19 - 32 mEq/L Final  . Glucose, Bld 03/08/2018 95  70 - 99 mg/dL Final  . BUN 03/08/2018 20  6 - 23 mg/dL Final  . Creatinine, Ser 03/08/2018 1.11  0.40 - 1.20 mg/dL Final  . Calcium  03/08/2018 9.5  8.4 - 10.5 mg/dL Final  . GFR 03/08/2018 61.95  >60.00 mL/min Final  . Hgb A1c MFr Bld 03/08/2018 5.9  4.6 - 6.5 % Final   Glycemic Control Guidelines for People with Diabetes:Non Diabetic:  <6%Goal of Therapy: <7%Additional Action Suggested:  >8%     Allergies as of 03/11/2018      Reactions   Codeine Phosphate    REACTION: unspecified, nausea, vomiting      Medication List        Accurate as of 03/11/18  1:21 PM. Always use your most recent med list.          aspirin 81 MG tablet Take 81 mg by mouth daily.   bisoprolol-hydrochlorothiazide 2.5-6.25 MG tablet Commonly known as:  ZIAC TAKE 1 TABLET BY MOUTH  DAILY   dexlansoprazole 60 MG capsule Commonly known as:  DEXILANT Take 1 capsule (60 mg total) by mouth daily.   dicyclomine 20 MG tablet Commonly known as:  BENTYL Take 1 tablet (20 mg total) by mouth 4 (four) times daily -  before meals and at bedtime.   glucose blood test strip USE AS  DIRECTED EVERY DAY Dx code E11.9   glucose blood test strip Use to check blood sugar one time daily   glucose blood test strip Use to check blood sugars once daily   losartan 50 MG tablet Commonly known as:  COZAAR TAKE 1 TABLET BY MOUTH  DAILY   meclizine 12.5 MG tablet Commonly known as:  ANTIVERT Take 1 tablet (12.5 mg total) by mouth 3 (three) times daily as needed for dizziness.   metFORMIN 500 MG tablet Commonly known as:  GLUCOPHAGE TAKE 1 TABLET BY MOUTH TWO  TIMES DAILY WITH MEALS   multivitamin tablet Take 1 tablet by mouth daily.   ONETOUCH DELICA LANCETS 12W Misc Use to check blood sugar 2 times a week .Dx code E11.9   pioglitazone 15 MG tablet Commonly known as:  ACTOS TAKE 1 TABLET BY MOUTH  DAILY   pravastatin 20 MG tablet Commonly known as:  PRAVACHOL TAKE 1 TABLET BY MOUTH  DAILY   verapamil 240 MG CR tablet Commonly known as:  CALAN-SR TAKE 1 TABLET BY MOUTH AT  BEDTIME   vitamin C 1000 MG tablet Take 1,000 mg by mouth  daily.   Vitamin D3 1000 units Caps Take 1 capsule by mouth daily.       Allergies:  Allergies  Allergen Reactions  . Codeine Phosphate     REACTION: unspecified, nausea, vomiting    Past Medical History:  Diagnosis Date  . Anemia    long ago  . Arthritis   . Diabetes mellitus   . Diverticulosis of colon (without mention of hemorrhage)   . Elevated cholesterol   . Fibroid   . Gastritis   . GERD (gastroesophageal reflux disease)   . Hiatal hernia   . Hypertension   . IBS (irritable bowel syndrome)   . Positive ANA (antinuclear antibody) 04/23/2012  . Stricture and stenosis of esophagus 09/03/2005  . Urinary incontinence     Past Surgical History:  Procedure Laterality Date  . ABDOMINAL HYSTERECTOMY  1985  . BACK SURGERY  11/2016  . CHOLECYSTECTOMY    . COLONOSCOPY  12/15/2007   diverticulosis  . ESOPHAGOGASTRODUODENOSCOPY  12/24/2012   normal   . KNEE SURGERY Right    arthroscopic  . UPPER GASTROINTESTINAL ENDOSCOPY      Family History  Problem Relation Age of Onset  . Diabetes Mother   . Hypertension Mother   . Stroke Brother        x 2  . Bipolar disorder Brother   . Stroke Brother   . Diabetes Brother   . Breast cancer Maternal Aunt        Age 1's  . Colon cancer Neg Hx   . Stomach cancer Neg Hx   . Throat cancer Neg Hx   . Liver disease Neg Hx   . Colon polyps Neg Hx   . Esophageal cancer Neg Hx   . Rectal cancer Neg Hx     Social History:  reports that she has never smoked. She has never used smokeless tobacco. She reports that she does not drink alcohol or use drugs.  Review of Systems:  HYPERTENSION:   She takes losartan 50 mg, Verapamil 240 mg and Ziac 2.5 Blood pressure is consistently controlled  Home BP checked only rarely  BP Readings from Last 3 Encounters:  03/11/18 118/64  02/17/18 121/63  02/11/18 140/84   Renal functions have been stable along with potassium recently:  Lab Results  Component Value Date   CREATININE  1.11  03/08/2018   BUN 20 03/08/2018   NA 140 03/08/2018   K 3.7 03/08/2018   CL 106 03/08/2018   CO2 27 03/08/2018     HYPERLIPIDEMIA: The lipid abnormality consists of elevated LDL which is well-controlled on pravastatin 20 mg  Lab Results  Component Value Date   CHOL 140 07/02/2017   HDL 61.80 07/02/2017   LDLCALC 62 07/02/2017   TRIG 83.0 07/02/2017   CHOLHDL 2 07/02/2017           Examination:   BP 118/64   Pulse 86   Ht 5\' 5"  (1.651 m)   Wt 199 lb (90.3 kg)   SpO2 97%   BMI 33.12 kg/m   Body mass index is 33.12 kg/m.   Diabetic Foot Exam - Simple   Simple Foot Form Diabetic Foot exam was performed with the following findings:  Yes   Visual Inspection No deformities, no ulcerations, no other skin breakdown bilaterally:  Yes Sensation Testing Intact to touch and monofilament testing bilaterally:  Yes Pulse Check Posterior Tibialis and Dorsalis pulse intact bilaterally:  Yes Comments      ASSESSMENT/ PLAN:   Diabetes type 2 with  obesity  Her A1c is 5.9 and about the same as usual  Blood sugars are consistently well controlled    She is on metformin 1000 mg a day and  15 mg Actos long-term  Blood sugars at home are fairly normal Also she is able to lose a little weight which she had gained previously Reminded her to do chair exercises regularly Also will try to keep overall caloric intake controlled  Hypertension:  well controlled with Ziac, verapamil and losartan 50 mg She will continue the 3 drug prescription program  .   Follow-up in 6 months    Synthia Fairbank 03/11/2018, 1:21 PM

## 2018-03-25 ENCOUNTER — Other Ambulatory Visit: Payer: Self-pay | Admitting: Gastroenterology

## 2018-03-25 ENCOUNTER — Other Ambulatory Visit: Payer: Self-pay | Admitting: Endocrinology

## 2018-03-26 ENCOUNTER — Other Ambulatory Visit: Payer: Self-pay | Admitting: Gynecology

## 2018-03-26 ENCOUNTER — Ambulatory Visit
Admission: RE | Admit: 2018-03-26 | Discharge: 2018-03-26 | Disposition: A | Discharge status: Home / Self Care | Payer: Medicare Other | Source: Ambulatory Visit | Attending: Gynecology | Admitting: Gynecology

## 2018-03-26 DIAGNOSIS — Z1231 Encounter for screening mammogram for malignant neoplasm of breast: Secondary | ICD-10-CM | POA: Diagnosis not present

## 2018-04-08 ENCOUNTER — Encounter

## 2018-04-08 ENCOUNTER — Ambulatory Visit: Payer: Self-pay

## 2018-04-08 ENCOUNTER — Ambulatory Visit: Payer: Medicare Other | Admitting: Neurology

## 2018-04-08 NOTE — Telephone Encounter (Signed)
Pt. called to report she has had intermittent, nonproductive cough, since March.  Reported "the cough is mild, but sporadic."  Denied shortness of breath, chest tightness, wheezing, sinus or nasal congestion, sore throat, or fever/ chills.   Reported mild ankle swelling with right > left; improves overnight.  Stated had been using Delsym Cough Syrup and Robitussin DM, but recently only using cough gtts.  Stated she wanted to make Dr. Jenny Reichmann aware of the persistent cough, that isn't resolving. Declined making an appt. at this time. Care advice given per protocol; verb. Understanding.  Advised will send Triage note to Dr. Jenny Reichmann.  Agrees with plan.   Reason for Disposition . Cough has been present for > 3 weeks  Answer Assessment - Initial Assessment Questions 1. ONSET: "When did the cough begin?"      It started in March; it has slacked-up a little bit 2. SEVERITY: "How bad is the cough today?"      Mild; sporadic  3. RESPIRATORY DISTRESS: "Describe your breathing."      Denied shortness of breath, wheezing or tightness in chest  4. FEVER: "Do you have a fever?" If so, ask: "What is your temperature, how was it measured, and when did it start?"     denied 5. HEMOPTYSIS: "Are you coughing up any blood?" If so ask: "How much?" (flecks, streaks, tablespoons, etc.)     no 6. TREATMENT: "What have you done so far to treat the cough?" (e.g., meds, fluids, humidifier)     Delsym and Robitussin DM 7. CARDIAC HISTORY: "Do you have any history of heart disease?" (e.g., heart attack, congestive heart failure)      Denied Cardiac problems or heart failure 8. LUNG HISTORY: "Do you have any history of lung disease?"  (e.g., pulmonary embolus, asthma, emphysema)     Denied the above 9. PE RISK FACTORS: "Do you have a history of blood clots?" (or: recent major surgery, recent prolonged travel, bedridden)     Denied 10. OTHER SYMPTOMS: "Do you have any other symptoms? (e.g., runny nose, wheezing, chest pain)   Ankle swelling with right > left ; improves at night; denied sinus drainage, or nasal congestion.  11. PREGNANCY: "Is there any chance you are pregnant?" "When was your last menstrual period?"      N/a  12. TRAVEL: "Have you traveled out of the country in the last month?" (e.g., travel history, exposures)       No  Protocols used: COUGH - ACUTE NON-PRODUCTIVE-A-AH

## 2018-05-08 ENCOUNTER — Other Ambulatory Visit: Payer: Self-pay | Admitting: Endocrinology

## 2018-05-27 ENCOUNTER — Other Ambulatory Visit: Payer: Self-pay | Admitting: Gastroenterology

## 2018-06-04 DIAGNOSIS — E119 Type 2 diabetes mellitus without complications: Secondary | ICD-10-CM | POA: Diagnosis not present

## 2018-06-10 ENCOUNTER — Ambulatory Visit (INDEPENDENT_AMBULATORY_CARE_PROVIDER_SITE_OTHER): Payer: Medicare Other | Admitting: Anesthesiology

## 2018-06-10 DIAGNOSIS — Z23 Encounter for immunization: Secondary | ICD-10-CM

## 2018-06-17 ENCOUNTER — Other Ambulatory Visit: Payer: Self-pay

## 2018-06-17 ENCOUNTER — Telehealth: Payer: Self-pay | Admitting: Endocrinology

## 2018-06-17 MED ORDER — BISOPROLOL-HYDROCHLOROTHIAZIDE 2.5-6.25 MG PO TABS: 1.0000 | ORAL_TABLET | Freq: Every day | ORAL | 1 refills | Status: DC

## 2018-06-17 NOTE — Telephone Encounter (Signed)
error 

## 2018-06-24 ENCOUNTER — Telehealth: Payer: Self-pay | Admitting: Gastroenterology

## 2018-06-25 ENCOUNTER — Other Ambulatory Visit (INDEPENDENT_AMBULATORY_CARE_PROVIDER_SITE_OTHER): Payer: Medicare Other

## 2018-06-25 ENCOUNTER — Ambulatory Visit (INDEPENDENT_AMBULATORY_CARE_PROVIDER_SITE_OTHER): Payer: Medicare Other | Admitting: Internal Medicine

## 2018-06-25 ENCOUNTER — Encounter: Payer: Self-pay | Admitting: Internal Medicine

## 2018-06-25 VITALS — BP 132/76 | HR 82 | Temp 98.4°F | Ht 65.0 in | Wt 199.0 lb

## 2018-06-25 DIAGNOSIS — I10 Essential (primary) hypertension: Secondary | ICD-10-CM | POA: Diagnosis not present

## 2018-06-25 DIAGNOSIS — E119 Type 2 diabetes mellitus without complications: Secondary | ICD-10-CM

## 2018-06-25 DIAGNOSIS — Z0000 Encounter for general adult medical examination without abnormal findings: Secondary | ICD-10-CM

## 2018-06-25 LAB — LIPID PANEL
Cholesterol: 139 mg/dL (ref 0–200)
HDL: 56.2 mg/dL (ref >39.00)
LDL Cholesterol: 63 mg/dL (ref 0–99)
NonHDL: 82.97
Total CHOL/HDL Ratio: 2
Triglycerides: 100 mg/dL (ref 0.0–149.0)
VLDL: 20 mg/dL (ref 0.0–40.0)

## 2018-06-25 LAB — URINALYSIS, ROUTINE W REFLEX MICROSCOPIC
Bilirubin Urine: NEGATIVE
Hgb urine dipstick: NEGATIVE
Leukocytes, UA: NEGATIVE
Nitrite: NEGATIVE
Specific Gravity, Urine: 1.025 (ref 1.000–1.030)
Total Protein, Urine: NEGATIVE
Urine Glucose: NEGATIVE
Urobilinogen, UA: 0.2 (ref 0.0–1.0)
pH: 5.5 (ref 5.0–8.0)

## 2018-06-25 LAB — BASIC METABOLIC PANEL
BUN: 14 mg/dL (ref 6–23)
CO2: 25 mEq/L (ref 19–32)
Calcium: 9.9 mg/dL (ref 8.4–10.5)
Chloride: 106 mEq/L (ref 96–112)
Creatinine, Ser: 1.08 mg/dL (ref 0.40–1.20)
GFR: 63.88 mL/min (ref >60.00)
Glucose, Bld: 81 mg/dL (ref 70–99)
Potassium: 4 mEq/L (ref 3.5–5.1)
Sodium: 140 mEq/L (ref 135–145)

## 2018-06-25 LAB — HEPATIC FUNCTION PANEL
ALT: 38 U/L — ABNORMAL HIGH (ref 0–35)
AST: 26 U/L (ref 0–37)
Albumin: 4 g/dL (ref 3.5–5.2)
Alkaline Phosphatase: 111 U/L (ref 39–117)
Bilirubin, Direct: 0.1 mg/dL (ref 0.0–0.3)
Total Bilirubin: 0.3 mg/dL (ref 0.2–1.2)
Total Protein: 7.6 g/dL (ref 6.0–8.3)

## 2018-06-25 LAB — CBC WITH DIFFERENTIAL/PLATELET
Basophils Absolute: 0 10*3/uL (ref 0.0–0.1)
Basophils Relative: 0.7 % (ref 0.0–3.0)
Eosinophils Absolute: 0.1 10*3/uL (ref 0.0–0.7)
Eosinophils Relative: 1.6 % (ref 0.0–5.0)
HCT: 37.7 % (ref 36.0–46.0)
Hemoglobin: 12.5 g/dL (ref 12.0–15.0)
Lymphocytes Relative: 40.4 % (ref 12.0–46.0)
Lymphs Abs: 1.3 10*3/uL (ref 0.7–4.0)
MCHC: 33.1 g/dL (ref 30.0–36.0)
MCV: 85.1 fl (ref 78.0–100.0)
Monocytes Absolute: 0.2 10*3/uL (ref 0.1–1.0)
Monocytes Relative: 7.1 % (ref 3.0–12.0)
Neutro Abs: 1.7 10*3/uL (ref 1.4–7.7)
Neutrophils Relative %: 50.2 % (ref 43.0–77.0)
Platelets: 253 10*3/uL (ref 150.0–400.0)
RBC: 4.43 Mil/uL (ref 3.87–5.11)
RDW: 14 % (ref 11.5–15.5)
WBC: 3.3 10*3/uL — ABNORMAL LOW (ref 4.0–10.5)

## 2018-06-25 LAB — MICROALBUMIN / CREATININE URINE RATIO
Creatinine,U: 237 mg/dL
Microalb Creat Ratio: 0.4 mg/g (ref 0.0–30.0)
Microalb, Ur: 0.9 mg/dL (ref 0.0–1.9)

## 2018-06-25 LAB — HEMOGLOBIN A1C: Hgb A1c MFr Bld: 6.2 % (ref 4.6–6.5)

## 2018-06-25 LAB — TSH: TSH: 1.22 u[IU]/mL (ref 0.35–4.50)

## 2018-06-25 NOTE — Assessment & Plan Note (Signed)
stable overall by history and exam, recent data reviewed with pt, and pt to continue medical treatment as before,  to f/u any worsening symptoms or concerns  

## 2018-06-25 NOTE — Progress Notes (Signed)
Subjective:    Patient ID: Daisy Dillon, female    DOB: [birth year], 73 y.o.   MRN: [id number]  HPI  Here for wellness and f/u;  Overall doing ok;  Pt denies Chest pain, worsening SOB, DOE, wheezing, orthopnea, PND, worsening LE edema, palpitations, dizziness or syncope.  Pt denies neurological change such as new headache, facial or extremity weakness.  Pt denies polydipsia, polyuria, or low sugar symptoms. Pt states overall good compliance with treatment and medications, good tolerability, and has been trying to follow appropriate diet.  Pt denies worsening depressive symptoms, suicidal ideation or panic. No fever, night sweats, wt loss, loss of appetite, or other constitutional symptoms.  Pt states good ability with ADL's, has low fall risk, home safety reviewed and adequate, no other significant changes in hearing or vision, and only occasionally active with exercise. No new complaints Past Medical History:  Diagnosis Date  . Anemia    long ago  . Arthritis   . Diabetes mellitus   . Diverticulosis of colon (without mention of hemorrhage)   . Elevated cholesterol   . Fibroid   . Gastritis   . GERD (gastroesophageal reflux disease)   . Hiatal hernia   . Hypertension   . IBS (irritable bowel syndrome)   . Positive ANA (antinuclear antibody) 04/23/2012  . Stricture and stenosis of esophagus 09/03/2005  . Urinary incontinence    Past Surgical History:  Procedure Laterality Date  . ABDOMINAL HYSTERECTOMY  1985  . BACK SURGERY  11/2016  . CHOLECYSTECTOMY    . COLONOSCOPY  12/15/2007   diverticulosis  . ESOPHAGOGASTRODUODENOSCOPY  12/24/2012   normal   . KNEE SURGERY Right    arthroscopic  . UPPER GASTROINTESTINAL ENDOSCOPY      reports that she has never smoked. She has never used smokeless tobacco. She reports that she does not drink alcohol or use drugs. family history includes Bipolar disorder in her brother; Breast cancer in her maternal aunt; Diabetes in her brother and  mother; Hypertension in her mother; Stroke in her brother and brother. Allergies  Allergen Reactions  . Codeine Phosphate     REACTION: unspecified, nausea, vomiting   Current Outpatient Medications on File Prior to Visit  Medication Sig Dispense Refill  . Ascorbic Acid (VITAMIN C) 1000 MG tablet Take 1,000 mg by mouth daily.    Marland Kitchen aspirin 81 MG tablet Take 81 mg by mouth daily.      . bisoprolol-hydrochlorothiazide (ZIAC) 2.5-6.25 MG tablet Take 1 tablet by mouth daily. 90 tablet 1  . Cholecalciferol (VITAMIN D3) 1000 UNITS CAPS Take 1 capsule by mouth daily.      Marland Kitchen dexlansoprazole (DEXILANT) 60 MG capsule Take 1 capsule (60 mg total) by mouth daily. 90 capsule 3  . dicyclomine (BENTYL) 20 MG tablet TAKE 1 TABLET BY MOUTH 4  TIMES DAILY BEFORE MEALS  AND AT BEDTIME 180 tablet 0  . glucose blood (ONE TOUCH ULTRA TEST) test strip USE AS DIRECTED EVERY DAY Dx code E11.9 50 each 3  . glucose blood test strip Use to check blood sugar one time daily 100 each 1  . losartan (COZAAR) 50 MG tablet TAKE 1 TABLET BY MOUTH  DAILY 90 tablet 0  . meclizine (ANTIVERT) 12.5 MG tablet Take 1 tablet (12.5 mg total) by mouth 3 (three) times daily as needed for dizziness. 21 tablet 0  . metFORMIN (GLUCOPHAGE) 500 MG tablet TAKE 1 TABLET BY MOUTH TWO  TIMES DAILY WITH MEALS (Patient taking differently: Take 500  mg by mouth 2 (two) times daily with a meal. ) 180 tablet 2  . Multiple Vitamin (MULTIVITAMIN) tablet Take 1 tablet by mouth daily.      Glory Rosebush DELICA LANCETS 24O MISC Use to check blood sugar 2 times a week .Dx code E11.9 100 each 1  . ONETOUCH VERIO test strip USE  STRIP TO CHECK GLUCOSE ONCE DAILY 50 each 7  . pioglitazone (ACTOS) 15 MG tablet TAKE 1 TABLET BY MOUTH  DAILY (Patient taking differently: Take 15 mg by mouth daily. ) 90 tablet 2  . pravastatin (PRAVACHOL) 20 MG tablet TAKE 1 TABLET BY MOUTH  DAILY (Patient taking differently: Take 20 mg by mouth daily. ) 90 tablet 1  . verapamil  (CALAN-SR) 240 MG CR tablet TAKE 1 TABLET BY MOUTH AT  BEDTIME (Patient taking differently: Take 240 mg by mouth at bedtime. ) 90 tablet 1   No current facility-administered medications on file prior to visit.    Review of Systems Constitutional: Negative for other unusual diaphoresis, sweats, appetite or weight changes HENT: Negative for other worsening hearing loss, ear pain, facial swelling, mouth sores or neck stiffness.   Eyes: Negative for other worsening pain, redness or other visual disturbance.  Respiratory: Negative for other stridor or swelling Cardiovascular: Negative for other palpitations or other chest pain  Gastrointestinal: Negative for worsening diarrhea or loose stools, blood in stool, distention or other pain Genitourinary: Negative for hematuria, flank pain or other change in urine volume.  Musculoskeletal: Negative for myalgias or other joint swelling.  Skin: Negative for other color change, or other wound or worsening drainage.  Neurological: Negative for other syncope or numbness. Hematological: Negative for other adenopathy or swelling Psychiatric/Behavioral: Negative for hallucinations, other worsening agitation, SI, self-injury, or new decreased concentration All other system neg per pt    Objective:   Physical Exam BP 132/76   Pulse 82   Temp 98.4 F (36.9 C) (Oral)   Ht 5\' 5"  (1.651 m)   Wt 199 lb (90.3 kg)   SpO2 96%   BMI 33.12 kg/m  VS noted,  Constitutional: Pt is oriented to person, place, and time. Appears well-developed and well-nourished, in no significant distress and comfortable Head: Normocephalic and atraumatic  Eyes: Conjunctivae and EOM are normal. Pupils are equal, round, and reactive to light Right Ear: External ear normal without discharge Left Ear: External ear normal without discharge Nose: Nose without discharge or deformity Mouth/Throat: Oropharynx is without other ulcerations and moist  Neck: Normal range of motion. Neck supple.  No JVD present. No tracheal deviation present or significant neck LA or mass Cardiovascular: Normal rate, regular rhythm, normal heart sounds and intact distal pulses.   Pulmonary/Chest: WOB normal and breath sounds without rales or wheezing  Abdominal: Soft. Bowel sounds are normal. NT. No HSM  Musculoskeletal: Normal range of motion. Exhibits no edema Lymphadenopathy: Has no other cervical adenopathy.  Neurological: Pt is alert and oriented to person, place, and time. Pt has normal reflexes. No cranial nerve deficit. Motor grossly intact, Gait intact Skin: Skin is warm and dry. No rash noted or new ulcerations Psychiatric:  Has normal mood and affect. Behavior is normal without agitation No other exam findings Lab Results  Component Value Date   WBC 3.3 (L) 06/25/2018   HGB 12.5 06/25/2018   HCT 37.7 06/25/2018   PLT 253.0 06/25/2018   GLUCOSE 81 06/25/2018   CHOL 139 06/25/2018   TRIG 100.0 06/25/2018   HDL 56.20 06/25/2018  LDLCALC 63 06/25/2018   ALT 38 (H) 06/25/2018   AST 26 06/25/2018   NA 140 06/25/2018   K 4.0 06/25/2018   CL 106 06/25/2018   CREATININE 1.08 06/25/2018   BUN 14 06/25/2018   CO2 25 06/25/2018   TSH 1.22 06/25/2018   INR 1.12 02/07/2018   HGBA1C 6.2 06/25/2018   MICROALBUR 0.9 06/25/2018        Assessment & Plan:

## 2018-06-25 NOTE — Patient Instructions (Signed)

## 2018-06-25 NOTE — Assessment & Plan Note (Signed)

## 2018-06-28 NOTE — Telephone Encounter (Signed)
Faxed patient assistance forms to [facility]. Waiting on a response. Will mail originals back to patient

## 2018-07-04 ENCOUNTER — Other Ambulatory Visit: Payer: Self-pay | Admitting: Endocrinology

## 2018-07-05 NOTE — Telephone Encounter (Signed)
Received fax from [place] patient assistance program that patient's Dexilant was approved until 06/16/19. Informed patient of approval.

## 2018-07-09 ENCOUNTER — Other Ambulatory Visit: Payer: Self-pay | Admitting: Endocrinology

## 2018-07-15 ENCOUNTER — Telehealth: Payer: Self-pay | Admitting: Endocrinology

## 2018-07-15 NOTE — Telephone Encounter (Signed)
Patient called re: the Rx for pioglitazone (ACTOS) 15 MG tablet that was sent to [facility] spoke with pharmacy. Patient called Pharmacy to check when she would  receive her medication (previously told pharmacy was putting a rush on it). Pharmacy told patient they would be sending a form to our office because it is going to take a long time if ever for patient to receive the medication listed above. Patient has been out of the medication listed above since 07/09/18. Please call patient at ph# [phone] to advise.

## 2018-07-21 ENCOUNTER — Other Ambulatory Visit: Payer: Self-pay

## 2018-07-21 MED ORDER — PIOGLITAZONE HCL 15 MG PO TABS: 15.0000 mg | ORAL_TABLET | Freq: Every day | ORAL | 3 refills | Status: DC

## 2018-07-24 ENCOUNTER — Other Ambulatory Visit: Payer: Self-pay | Admitting: Gastroenterology

## 2018-07-29 ENCOUNTER — Encounter: Payer: Self-pay | Admitting: Internal Medicine

## 2018-07-29 ENCOUNTER — Ambulatory Visit (INDEPENDENT_AMBULATORY_CARE_PROVIDER_SITE_OTHER): Payer: Medicare Other | Admitting: Internal Medicine

## 2018-07-29 VITALS — BP 130/68 | HR 88 | Temp 98.2°F | Wt 198.0 lb

## 2018-07-29 DIAGNOSIS — I10 Essential (primary) hypertension: Secondary | ICD-10-CM | POA: Diagnosis not present

## 2018-07-29 DIAGNOSIS — E119 Type 2 diabetes mellitus without complications: Secondary | ICD-10-CM | POA: Diagnosis not present

## 2018-07-29 DIAGNOSIS — M5412 Radiculopathy, cervical region: Secondary | ICD-10-CM | POA: Diagnosis not present

## 2018-07-29 MED ORDER — GABAPENTIN 100 MG PO CAPS: 100.0000 mg | ORAL_CAPSULE | Freq: Three times a day (TID) | ORAL | 3 refills | Status: DC

## 2018-07-29 MED ORDER — TRAMADOL HCL 50 MG PO TABS: 50.0000 mg | ORAL_TABLET | Freq: Four times a day (QID) | ORAL | 0 refills | Status: DC | PRN

## 2018-07-29 MED ORDER — PREDNISONE 10 MG PO TABS: ORAL_TABLET | ORAL | 0 refills | Status: DC

## 2018-07-29 NOTE — Patient Instructions (Signed)
You have a "right cervical radiculopathy"  - a type of nerve irriation or pinching more on the right back of the neck that is causing right arm pain and weakness  Please take all new medication as prescribed - the tramadol for pain as needed, gabapentin for nerve pain (take regularly), and prednisone  Please return in 1 - 2 weeks for MRI if not better  OK to call in 1 week if you need further tramadol  Please continue all other medications as before, and refills have been done if requested.  Please have the pharmacy call with any other refills you may need.  Please keep your appointments with your specialists as you may have planned

## 2018-07-29 NOTE — Progress Notes (Signed)
Subjective:    Patient ID: Daisy Dillon, female    DOB: 1945-09-09, 73 y.o.   MRN: [id number]  HPI  Here with 1 wk onset acute pain, numbness and weakness to the post neck and RUE, mod to severe at times, not really better or worse with anything.  No prior hx of cervical spine DDD or DJD. Denies any recent injury, fall or heavy lifting.  Pt denies chest pain, increased sob or doe, wheezing, orthopnea, PND, increased LE swelling, palpitations, dizziness or syncope.  Pt denies new neurological symptoms such as new headache, or facial or extremity weakness or numbness except for the above.   Pt denies polydipsia, polyuria.   Past Medical History:  Diagnosis Date  . Anemia    long ago  . Arthritis   . Diabetes mellitus   . Diverticulosis of colon (without mention of hemorrhage)   . Elevated cholesterol   . Fibroid   . Gastritis   . GERD (gastroesophageal reflux disease)   . Hiatal hernia   . Hypertension   . IBS (irritable bowel syndrome)   . Positive ANA (antinuclear antibody) 04/23/2012  . Stricture and stenosis of esophagus 09/03/2005  . Urinary incontinence    Past Surgical History:  Procedure Laterality Date  . ABDOMINAL HYSTERECTOMY  1985  . BACK SURGERY  11/2016  . CHOLECYSTECTOMY    . COLONOSCOPY  12/15/2007   diverticulosis  . ESOPHAGOGASTRODUODENOSCOPY  12/24/2012   normal   . KNEE SURGERY Right    arthroscopic  . UPPER GASTROINTESTINAL ENDOSCOPY      reports that she has never smoked. She has never used smokeless tobacco. She reports that she does not drink alcohol or use drugs. family history includes Bipolar disorder in her brother; Breast cancer in her maternal aunt; Diabetes in her brother and mother; Hypertension in her mother; Stroke in her brother and brother. Allergies  Allergen Reactions  . Codeine Phosphate     REACTION: unspecified, nausea, vomiting   Current Outpatient Medications on File Prior to Visit  Medication Sig Dispense Refill  .  Ascorbic Acid (VITAMIN C) 1000 MG tablet Take 1,000 mg by mouth daily.    Marland Kitchen aspirin 81 MG tablet Take 81 mg by mouth daily.      . bisoprolol-hydrochlorothiazide (ZIAC) 2.5-6.25 MG tablet Take 1 tablet by mouth daily. 90 tablet 1  . Cholecalciferol (VITAMIN D3) 1000 UNITS CAPS Take 1 capsule by mouth daily.      Marland Kitchen dexlansoprazole (DEXILANT) 60 MG capsule Take 1 capsule (60 mg total) by mouth daily. 90 capsule 3  . dicyclomine (BENTYL) 20 MG tablet TAKE 1 TABLET BY MOUTH 4  TIMES DAILY BEFORE MEALS  AND AT BEDTIME 180 tablet 0  . glucose blood (ONE TOUCH ULTRA TEST) test strip USE AS DIRECTED EVERY DAY Dx code E11.9 50 each 3  . glucose blood test strip Use to check blood sugar one time daily 100 each 1  . losartan (COZAAR) 50 MG tablet TAKE 1 TABLET BY MOUTH  DAILY 90 tablet 0  . metFORMIN (GLUCOPHAGE) 500 MG tablet TAKE 1 TABLET BY MOUTH TWO  TIMES DAILY WITH MEALS 180 tablet 2  . Multiple Vitamin (MULTIVITAMIN) tablet Take 1 tablet by mouth daily.      Glory Rosebush DELICA LANCETS 53Z MISC Use to check blood sugar 2 times a week .Dx code E11.9 100 each 1  . ONETOUCH VERIO test strip USE  STRIP TO CHECK GLUCOSE ONCE DAILY 50 each 7  . pioglitazone (  ACTOS) 15 MG tablet Take 1 tablet (15 mg total) by mouth daily. 30 tablet 3  . pravastatin (PRAVACHOL) 20 MG tablet TAKE 1 TABLET BY MOUTH  DAILY (Patient taking differently: Take 20 mg by mouth daily. ) 90 tablet 1  . verapamil (CALAN-SR) 240 MG CR tablet Take 1 tablet (240 mg total) by mouth at bedtime. 90 tablet 0   No current facility-administered medications on file prior to visit.    Review of Systems  Constitutional: Negative for other unusual diaphoresis or sweats HENT: Negative for ear discharge or swelling Eyes: Negative for other worsening visual disturbances Respiratory: Negative for stridor or other swelling  Gastrointestinal: Negative for worsening distension or other blood Genitourinary: Negative for retention or other urinary  change Musculoskeletal: Negative for other MSK pain or swelling Skin: Negative for color change or other new lesions Neurological: Negative for worsening tremors and other numbness  Psychiatric/Behavioral: Negative for worsening agitation or other fatigue All other system neg per pt    Objective:   Physical Exam BP 130/68 (BP Location: Left Arm, Patient Position: Sitting, Cuff Size: Large)   Pulse 88   Temp 98.2 F (36.8 C) (Oral)   Wt 198 lb (89.8 kg)   SpO2 95%   BMI 32.95 kg/m  VS noted,  Constitutional: Pt appears in NAD HENT: Head: NCAT.  Right Ear: External ear normal.  Left Ear: External ear normal.  Eyes: . Pupils are equal, round, and reactive to light. Conjunctivae and EOM are normal Nose: without d/c or deformity Neck: Neck supple. Gross normal ROM, nontender to palpate in midline or paravertebral, no swelling , erythema or rash Cardiovascular: Normal rate and regular rhythm.   Pulmonary/Chest: Effort normal and breath sounds without rales or wheezing.  Neurological: Pt is alert. At baseline orientation, motor grossly intact except for RUE 4-4+/5 motor weakness with reduced sensation to LT Skin: Skin is warm. No rashes, other new lesions, no LE edema Psychiatric: Pt behavior is normal without agitation  No other exam findings Lab Results  Component Value Date   WBC 3.3 (L) 06/25/2018   HGB 12.5 06/25/2018   HCT 37.7 06/25/2018   PLT 253.0 06/25/2018   GLUCOSE 81 06/25/2018   CHOL 139 06/25/2018   TRIG 100.0 06/25/2018   HDL 56.20 06/25/2018   LDLCALC 63 06/25/2018   ALT 38 (H) 06/25/2018   AST 26 06/25/2018   NA 140 06/25/2018   K 4.0 06/25/2018   CL 106 06/25/2018   CREATININE 1.08 06/25/2018   BUN 14 06/25/2018   CO2 25 06/25/2018   TSH 1.22 06/25/2018   INR 1.12 02/07/2018   HGBA1C 6.2 06/25/2018   MICROALBUR 0.9 06/25/2018      Assessment & Plan:

## 2018-07-29 NOTE — Assessment & Plan Note (Signed)
stable overall by history and exam, recent data reviewed with pt, and pt to continue medical treatment as before,  to f/u any worsening symptoms or concerns  

## 2018-07-29 NOTE — Assessment & Plan Note (Signed)
New onset, + neuro change, declines MRI for now, for tramadol prn pain, gabapentin and predpac asd,  to f/u any worsening symptoms or concerns

## 2018-08-02 ENCOUNTER — Telehealth: Payer: Self-pay | Admitting: Gastroenterology

## 2018-08-02 MED ORDER — DICYCLOMINE HCL 20 MG PO TABS: ORAL_TABLET | ORAL | 3 refills | Status: DC

## 2018-08-02 NOTE — Telephone Encounter (Signed)
Pt calling regarding rf for dicyclomine, she stated that her pharmacy told her that we denied request. She wants to know why.

## 2018-08-02 NOTE — Telephone Encounter (Signed)
Informed patient is was denied in error but I will send a refill to her pharmacy.

## 2018-08-25 ENCOUNTER — Ambulatory Visit (INDEPENDENT_AMBULATORY_CARE_PROVIDER_SITE_OTHER): Payer: Medicare Other | Admitting: Obstetrics & Gynecology

## 2018-08-25 ENCOUNTER — Other Ambulatory Visit: Payer: Self-pay

## 2018-08-25 ENCOUNTER — Encounter: Payer: Self-pay | Admitting: Obstetrics & Gynecology

## 2018-08-25 VITALS — BP 120/70 | Ht 65.0 in | Wt 206.0 lb

## 2018-08-25 DIAGNOSIS — Z90710 Acquired absence of both cervix and uterus: Secondary | ICD-10-CM

## 2018-08-25 DIAGNOSIS — E6609 Other obesity due to excess calories: Secondary | ICD-10-CM

## 2018-08-25 DIAGNOSIS — Z780 Asymptomatic menopausal state: Secondary | ICD-10-CM | POA: Diagnosis not present

## 2018-08-25 DIAGNOSIS — Z01419 Encounter for gynecological examination (general) (routine) without abnormal findings: Secondary | ICD-10-CM

## 2018-08-25 DIAGNOSIS — Z6834 Body mass index (BMI) 34.0-34.9, adult: Secondary | ICD-10-CM

## 2018-08-25 NOTE — Progress Notes (Signed)
Daisy Dillon 1945/08/06 [id number]   History:    73 y.o. G2P2L2  Has many grand-children  RP:  Established patient presenting for annual gyn exam   HPI: S/P Total Hysterectomy.  Postmenopausal, well on no hormone replacement therapy.  No pelvic pain.  Abstinent.  Breast normal.  Body mass index 34.28.  Limited physical activity.  Health labs with family physician.  Followed by endocrinology for diabetes mellitus type 2 on metformin.  Past medical history,surgical history, family history and social history were all reviewed and documented in the EPIC chart.  Gynecologic History No LMP recorded. Patient has had a hysterectomy. Contraception: status post hysterectomy Last Pap: 04/2017. Results were: Negative Last mammogram: 03/2018. Results were: Negative Bone Density: 05/2015 Normal, repeat at 4 yrs Colonoscopy: 2019  Obstetric History OB History  Gravida Para Term Preterm AB Living  2 2 2     2   SAB TAB Ectopic Multiple Live Births               # Outcome Date GA Lbr Len/2nd Weight Sex Delivery Anes PTL Lv  2 Term           1 Term              ROS: A ROS was performed and pertinent positives and negatives are included in the history.  GENERAL: No fevers or chills. HEENT: No change in vision, no earache, sore throat or sinus congestion. NECK: No pain or stiffness. CARDIOVASCULAR: No chest pain or pressure. No palpitations. PULMONARY: No shortness of breath, cough or wheeze. GASTROINTESTINAL: No abdominal pain, nausea, vomiting or diarrhea, melena or bright red blood per rectum. GENITOURINARY: No urinary frequency, urgency, hesitancy or dysuria. MUSCULOSKELETAL: No joint or muscle pain, no back pain, no recent trauma. DERMATOLOGIC: No rash, no itching, no lesions. ENDOCRINE: No polyuria, polydipsia, no heat or cold intolerance. No recent change in weight. HEMATOLOGICAL: No anemia or easy bruising or bleeding. NEUROLOGIC: No headache, seizures, numbness, tingling or  weakness. PSYCHIATRIC: No depression, no loss of interest in normal activity or change in sleep pattern.     Exam:   BP 120/70   Ht 5\' 5"  (1.651 m)   Wt 206 lb (93.4 kg)   BMI 34.28 kg/m   Body mass index is 34.28 kg/m.  General appearance : Well developed well nourished female. No acute distress HEENT: Eyes: no retinal hemorrhage or exudates,  Neck supple, trachea midline, no carotid bruits, no thyroidmegaly Lungs: Clear to auscultation, no rhonchi or wheezes, or rib retractions  Heart: Regular rate and rhythm, no murmurs or gallops Breast:Examined in sitting and supine position were symmetrical in appearance, no palpable masses or tenderness,  no skin retraction, no nipple inversion, no nipple discharge, no skin discoloration, no axillary or supraclavicular lymphadenopathy Abdomen: no palpable masses or tenderness, no rebound or guarding Extremities: no edema or skin discoloration or tenderness  Pelvic: Vulva: Normal             Vagina: No gross lesions or discharge  Cervix/Uterus absent  Adnexa  Without masses or tenderness  Anus: Normal   Assessment/Plan:  73 y.o. female for annual exam   1. Well female exam with routine gynecological exam Gynecologic exam status post total hysterectomy and menopause.  Last Pap test was negative in November 2018, no indication to repeat the Pap test this year.  Breast exam normal.  Last mammogram October 2019 was negative.  Colonoscopy in 2019.  Health labs with family physician.  Followed  by endocrinology for diabetes mellitus type 2 on metformin.  2. H/O total hysterectomy  3. Postmenopausal Well on no hormone replacement therapy.  Bone density normal in 05/2015.  Will repeat bone density at 5 years.  Vitamin D supplement, calcium intake of 1.2 to 1.5 g/day and regular weightbearing physical activities recommended.  4. Class 1 obesity due to excess calories without serious comorbidity with body mass index (BMI) of 34.0 to 34.9 in adult  Recommend lower calorie/carb diet by decreasing the portions for weight loss.  Princess Bruins MD, 12:11 PM 08/25/2018

## 2018-08-25 NOTE — Patient Instructions (Signed)
1. Well female exam with routine gynecological exam Gynecologic exam status post total hysterectomy and menopause.  Last Pap test was negative in November 2018, no indication to repeat the Pap test this year.  Breast exam normal.  Last mammogram October 2019 was negative.  Colonoscopy in 2019.  Health labs with family physician.  Followed by endocrinology for diabetes mellitus type 2 on metformin.  2. H/O total hysterectomy  3. Postmenopausal Well on no hormone replacement therapy.  Bone density normal in 05/2015.  Will repeat bone density at 5 years.  Vitamin D supplement, calcium intake of 1.2 to 1.5 g/day and regular weightbearing physical activities recommended.  4. Class 1 obesity due to excess calories without serious comorbidity with body mass index (BMI) of 34.0 to 34.9 in adult Recommend lower calorie/carb diet by decreasing the portions for weight loss.  Coila, it was a pleasure seeing you today!

## 2018-09-06 ENCOUNTER — Telehealth: Payer: Self-pay | Admitting: Endocrinology

## 2018-09-06 ENCOUNTER — Other Ambulatory Visit: Payer: Self-pay

## 2018-09-06 MED ORDER — BISOPROLOL-HYDROCHLOROTHIAZIDE 2.5-6.25 MG PO TABS: 1.0000 | ORAL_TABLET | Freq: Every day | ORAL | 1 refills | Status: DC

## 2018-09-06 NOTE — Telephone Encounter (Signed)
MEDICATION: bisoprolol-hydrochlorothiazide (ZIAC) 2.5-6.25 MG tablet losartan (COZAAR) 50 MG tablet  PHARMACY:  [facility]   IS THIS A 90 DAY SUPPLY : Yes  IS PATIENT OUT OF MEDICATION:   IF NOT; HOW MUCH IS LEFT: 2 left , 3 left  LAST APPOINTMENT DATE: @2 /10/2018  NEXT APPOINTMENT DATE:@3 /24/2020  DO WE HAVE YOUR PERMISSION TO LEAVE A DETAILED MESSAGE:  OTHER COMMENTS:    **Let patient know to contact pharmacy at the end of the day to make sure medication is ready. **  ** Please notify patient to allow 48-72 hours to process**  **Encourage patient to contact the pharmacy for refills or they can request refills through [facility]**

## 2018-09-06 NOTE — Telephone Encounter (Signed)
Rx sent 

## 2018-09-07 ENCOUNTER — Other Ambulatory Visit: Payer: Medicare Other

## 2018-09-07 ENCOUNTER — Other Ambulatory Visit: Payer: Self-pay | Admitting: Endocrinology

## 2018-09-07 DIAGNOSIS — E119 Type 2 diabetes mellitus without complications: Secondary | ICD-10-CM

## 2018-09-09 ENCOUNTER — Ambulatory Visit: Payer: Medicare Other | Admitting: Endocrinology

## 2018-09-10 NOTE — Telephone Encounter (Signed)
MEDICATION: losartan (COZAAR) 50 MG tablet  PHARMACY:  [facility]  IS THIS A 90 DAY SUPPLY :   IS PATIENT OUT OF MEDICATION: Yes  IF NOT; HOW MUCH IS LEFT:   LAST APPOINTMENT DATE: @2 /10/2018  NEXT APPOINTMENT DATE:@3 /23/2020  DO WE HAVE YOUR PERMISSION TO LEAVE A DETAILED MESSAGE:  OTHER COMMENTS:    **Let patient know to contact pharmacy at the end of the day to make sure medication is ready. **  ** Please notify patient to allow 48-72 hours to process**  **Encourage patient to contact the pharmacy for refills or they can request refills through [facility]**

## 2018-09-13 ENCOUNTER — Telehealth: Payer: Self-pay | Admitting: Endocrinology

## 2018-09-13 ENCOUNTER — Other Ambulatory Visit: Payer: Self-pay

## 2018-09-13 MED ORDER — LOSARTAN POTASSIUM 50 MG PO TABS: 50.0000 mg | ORAL_TABLET | Freq: Every day | ORAL | 0 refills | Status: DC

## 2018-09-13 NOTE — Telephone Encounter (Signed)
MEDICATION: Losartan 50 MG  PHARMACY:  [facility] on [address]   IS THIS A 90 DAY SUPPLY : 30 mo  IS PATIENT OUT OF MEDICATION: yes  IF NOT; HOW MUCH IS LEFT:   LAST APPOINTMENT DATE: @3 /23/2020  NEXT APPOINTMENT DATE:@5 /09/2018  DO WE HAVE YOUR PERMISSION TO LEAVE A DETAILED MESSAGE: YES    OTHER COMMENTS:    **Let patient know to contact pharmacy at the end of the day to make sure medication is ready. **  ** Please notify patient to allow 48-72 hours to process**  **Encourage patient to contact the pharmacy for refills or they can request refills through [facility]**

## 2018-09-13 NOTE — Telephone Encounter (Signed)
Rx sent 

## 2018-09-14 ENCOUNTER — Ambulatory Visit: Payer: Medicare Other | Admitting: Endocrinology

## 2018-09-16 ENCOUNTER — Other Ambulatory Visit: Payer: Self-pay

## 2018-09-16 MED ORDER — PIOGLITAZONE HCL 15 MG PO TABS: 15.0000 mg | ORAL_TABLET | Freq: Every day | ORAL | 1 refills | Status: DC

## 2018-09-29 ENCOUNTER — Other Ambulatory Visit: Payer: Self-pay | Admitting: Endocrinology

## 2018-09-30 ENCOUNTER — Telehealth: Payer: Self-pay | Admitting: *Deleted

## 2018-09-30 NOTE — Telephone Encounter (Signed)
Copied from [place] [id number]. Topic: General - Inquiry >> Sep 30, 2018  1:05 PM Margot Ables wrote: Reason for CRM: pt asking if Dr. Jenny Reichmann will complete and mail a form for handicap placard for her. Her expires the end of April 2020. Pt has limited movement due to previous back surgery and moving too much causes it to hurt and she cannot stand up straight. Please advise.  Verified mailing address: [place]  [place] [address]

## 2018-10-01 ENCOUNTER — Other Ambulatory Visit: Payer: Self-pay | Admitting: Gastroenterology

## 2018-10-01 NOTE — Telephone Encounter (Signed)
Form has been mailed out to address provided below.

## 2018-10-01 NOTE — Telephone Encounter (Signed)
Done hardcopy to finish filling out to shirron

## 2018-10-04 ENCOUNTER — Telehealth: Payer: Self-pay | Admitting: Internal Medicine

## 2018-10-04 MED ORDER — TRAMADOL HCL 50 MG PO TABS: 50.0000 mg | ORAL_TABLET | Freq: Four times a day (QID) | ORAL | 2 refills | Status: DC | PRN

## 2018-10-04 NOTE — Telephone Encounter (Signed)
Copied from [place] [phone]. Topic: General - Inquiry >> Sep 30, 2018  1:05 PM Margot Ables wrote: Reason for CRM: pt asking if Dr. Jenny Reichmann will complete and mail a form for handicap placard for her. Her expires the end of April 2020. Pt has limited movement due to previous back surgery and moving too much causes it to hurt and she cannot stand up straight. Please advise.  Verified mailing address: [address]  [place] [address]  Form has been completed & placed in providers box to sign.

## 2018-10-04 NOTE — Telephone Encounter (Signed)
Patient has been informed.

## 2018-10-04 NOTE — Telephone Encounter (Signed)
traMADol (ULTRAM) 50 MG tablet  Patient is requesting a refill. She states this really helped with her arm pain.

## 2018-10-04 NOTE — Telephone Encounter (Signed)
Done erx 

## 2018-10-07 NOTE — Telephone Encounter (Signed)
Form signed, Copy sent to scan, Original mailed to patient.

## 2018-10-08 ENCOUNTER — Telehealth: Payer: Self-pay | Admitting: Internal Medicine

## 2018-10-08 NOTE — Telephone Encounter (Signed)
Please advise 

## 2018-10-08 NOTE — Telephone Encounter (Signed)
[facility] for virtual visit monday

## 2018-10-08 NOTE — Telephone Encounter (Signed)
Patient called and stated that she has been getting very nauseous when taking the tramadol. She would like a call back for advice on what she should do

## 2018-10-11 ENCOUNTER — Encounter: Payer: Self-pay | Admitting: Internal Medicine

## 2018-10-11 ENCOUNTER — Ambulatory Visit (INDEPENDENT_AMBULATORY_CARE_PROVIDER_SITE_OTHER): Payer: Medicare Other | Admitting: Internal Medicine

## 2018-10-11 DIAGNOSIS — M5412 Radiculopathy, cervical region: Secondary | ICD-10-CM | POA: Diagnosis not present

## 2018-10-11 MED ORDER — GABAPENTIN 100 MG PO CAPS: 100.0000 mg | ORAL_CAPSULE | Freq: Three times a day (TID) | ORAL | 3 refills | Status: DC

## 2018-10-11 MED ORDER — HYDROCODONE-ACETAMINOPHEN 5-325 MG PO TABS: 1.0000 | ORAL_TABLET | Freq: Four times a day (QID) | ORAL | 0 refills | Status: DC | PRN

## 2018-10-11 NOTE — Telephone Encounter (Signed)
Pt has scheduled a phone visit today @ 2.40pm. She does not have video capabilities.

## 2018-10-11 NOTE — Patient Instructions (Addendum)
Ok to stop the tramadol  Please take all new medication as prescribed - the hydrocodone  OK to increase the gabapentin to 300 mg three time per day  Please call in a week if not better, to consider referral to Orthopedic and/or MRI for the neck  Please continue all other medications as before, and refills have been done if requested.  Please have the pharmacy call with any other refills you may need.  Please continue your efforts at being more active, low cholesterol diet, and weight control  Please keep your appointments with your specialists as you may have planned

## 2018-10-11 NOTE — Assessment & Plan Note (Signed)
See above

## 2018-10-11 NOTE — Progress Notes (Signed)
Cumulative time during 7-day interval 15 mn, there was not an associated office visit for this concern within a 7 day period.  Verbal consent for services obtained from patient prior to services given.  Names of all persons present for services: Cathlean Cower, MD, patient  Chief complaint: nausea with tramadol  History:   Here with c/o persistent recurring nausea without vomiting associated soon after taking tramadol reproducibly,  Pt denies fever, wt loss, night sweats, loss of appetite, or other constitutional symptoms  Denies worsening reflux, abd pain, dysphagia, bowel change or blood.  Still has ongoing mod to severe right neck shoulder and arm pain, only somewhat improved with gabapentin 100 tid as well. Nothing else makes better or worse.  Pt denies chest pain, increased sob or doe, wheezing, orthopnea, PND, increased LE swelling, palpitations, dizziness or syncope.  . Pt denies polydipsia, polyuria  Past Medical History:  Diagnosis Date  . Anemia    long ago  . Arthritis   . Diabetes mellitus   . Diverticulosis of colon (without mention of hemorrhage)   . Elevated cholesterol   . Fibroid   . Gastritis   . GERD (gastroesophageal reflux disease)   . Hiatal hernia   . Hypertension   . IBS (irritable bowel syndrome)   . Positive ANA (antinuclear antibody) 04/23/2012  . Stricture and stenosis of esophagus 09/03/2005  . Urinary incontinence    No results found for this or any previous visit (from the past 48 hour(s)).  Lab Results  Component Value Date   WBC 3.3 (L) 06/25/2018   HGB 12.5 06/25/2018   HCT 37.7 06/25/2018   PLT 253.0 06/25/2018   GLUCOSE 81 06/25/2018   CHOL 139 06/25/2018   TRIG 100.0 06/25/2018   HDL 56.20 06/25/2018   LDLCALC 63 06/25/2018   ALT 38 (H) 06/25/2018   AST 26 06/25/2018   NA 140 06/25/2018   K 4.0 06/25/2018   CL 106 06/25/2018   CREATININE 1.08 06/25/2018   BUN 14 06/25/2018   CO2 25 06/25/2018   TSH 1.22 06/25/2018   INR 1.12 02/07/2018    HGBA1C 6.2 06/25/2018   MICROALBUR 0.9 06/25/2018    A/P/next steps:  Right cervical pain with radiation to RUE with nausea related to tramadol; to d/c tramadol, ok for hydrocodone prn asd, increased gabapentin 300 tid and refers ortho referral for now

## 2018-10-18 ENCOUNTER — Other Ambulatory Visit: Payer: Self-pay

## 2018-10-18 ENCOUNTER — Other Ambulatory Visit (INDEPENDENT_AMBULATORY_CARE_PROVIDER_SITE_OTHER): Payer: Medicare HMO

## 2018-10-18 ENCOUNTER — Telehealth: Payer: Self-pay | Admitting: Internal Medicine

## 2018-10-18 DIAGNOSIS — E119 Type 2 diabetes mellitus without complications: Secondary | ICD-10-CM | POA: Diagnosis not present

## 2018-10-18 LAB — BASIC METABOLIC PANEL
BUN: 17 mg/dL (ref 6–23)
CO2: 24 mEq/L (ref 19–32)
Calcium: 9.5 mg/dL (ref 8.4–10.5)
Chloride: 106 mEq/L (ref 96–112)
Creatinine, Ser: 1.06 mg/dL (ref 0.40–1.20)
GFR: 61.36 mL/min (ref >60.00)
Glucose, Bld: 91 mg/dL (ref 70–99)
Potassium: 3.7 mEq/L (ref 3.5–5.1)
Sodium: 141 mEq/L (ref 135–145)

## 2018-10-18 LAB — HEMOGLOBIN A1C: Hgb A1c MFr Bld: 6 % (ref 4.6–6.5)

## 2018-10-18 MED ORDER — GABAPENTIN 300 MG PO CAPS: 300.0000 mg | ORAL_CAPSULE | Freq: Three times a day (TID) | ORAL | 1 refills | Status: DC

## 2018-10-18 NOTE — Telephone Encounter (Signed)
Sorry for the oversight  OK to take 3 of 100 mg to get to 300 mg three times per day, to use it up  I sent new rx for 300 mg TID for after that to [facility]

## 2018-10-18 NOTE — Telephone Encounter (Signed)
Routing to dr john, please advise, thanks 

## 2018-10-18 NOTE — Telephone Encounter (Signed)
Copied from [place] [id number]. Topic: Quick Communication - See Telephone Encounter >> Oct 18, 2018  1:06 PM Vernona Rieger wrote: CRM for notification. See Telephone encounter for: 10/18/18.  Patient states she picked up her gabapentin (NEURONTIN) from the pharmacy and did not realize until she got home that it was not increased to 300mg . She said that Dr Jenny Reichmann was going to increase it when they had her visit on 4/27. She would like to know what she needs to do since she picked up the 100mg 

## 2018-10-19 NOTE — Telephone Encounter (Signed)
Patient advised of dr Judi Cong note/instructions--patient repeated back for understanding

## 2018-10-20 ENCOUNTER — Ambulatory Visit (INDEPENDENT_AMBULATORY_CARE_PROVIDER_SITE_OTHER): Payer: Medicare HMO | Admitting: Endocrinology

## 2018-10-20 ENCOUNTER — Telehealth: Payer: Self-pay | Admitting: Endocrinology

## 2018-10-20 ENCOUNTER — Other Ambulatory Visit: Payer: Self-pay

## 2018-10-20 ENCOUNTER — Encounter: Payer: Self-pay | Admitting: Endocrinology

## 2018-10-20 DIAGNOSIS — E119 Type 2 diabetes mellitus without complications: Secondary | ICD-10-CM

## 2018-10-20 DIAGNOSIS — I10 Essential (primary) hypertension: Secondary | ICD-10-CM

## 2018-10-20 NOTE — Progress Notes (Signed)
Patient ID: Daisy Dillon, female   DOB: 10-13-1945, 73 y.o.   MRN: [id number]   Today's office visit was provided via telemedicine using video technique Explained to the patient and the the limitations of evaluation and management by telemedicine and the availability of in person appointments.  The patient understood the limitations and agreed to proceed. Patient also understood that the telehealth visit is billable. . Location of the patient: Home . Location of the provider: Office Only the patient and myself were participating in the encounter   Reason for Appointment: follow-up   History of Present Illness   Diagnosis: Type 2 DIABETES MELITUS, date of diagnosis:   2002   She has had stable long-term control of her mild diabetes with combination of low-doses of Actos and metformin  She usually has had a normal A1c and this stays in the 5.7- 6% range  A1c is now 6%  Current management: Blood sugar patterns and history:   As before she is checking her sugars before and after various meals but mostly lunch and dinner  Blood sugars are excellent with only a couple of times have been over 100  She was using the One Touch meter and now her meter is not covered by her insurance  She has difficulty walking because of back pain and is only doing a little activity and home  However she thinks her weight is about the same as on her last visit at about 199.  Not clear why she has readings as low as 63 which are asymptomatic She knows to moderate amounts of portions and carbohydrates in her diet  Monitors blood glucose: Less than once a day .    Glucometer: One touch     Blood Glucose readings from patient history:  Blood sugar range 63-98 before meals and 90-107 after meals No averages available          Complications: are: No history of neuropathy     Wt Readings from Last 3 Encounters:  08/25/18 206 lb (93.4 kg)  07/29/18 198 lb (89.8 kg)  06/25/18 199  lb (90.3 kg)    Lab Results  Component Value Date   HGBA1C 6.0 10/18/2018   HGBA1C 6.2 06/25/2018   HGBA1C 5.9 03/08/2018   Lab Results  Component Value Date   MICROALBUR 0.9 06/25/2018   LDLCALC 63 06/25/2018   CREATININE 1.06 10/18/2018    OTHER active problems: See review of systems   Lab on 10/18/2018  Component Date Value Ref Range Status  . Sodium 10/18/2018 141  135 - 145 mEq/L Final  . Potassium 10/18/2018 3.7  3.5 - 5.1 mEq/L Final  . Chloride 10/18/2018 106  96 - 112 mEq/L Final  . CO2 10/18/2018 24  19 - 32 mEq/L Final  . Glucose, Bld 10/18/2018 91  70 - 99 mg/dL Final  . BUN 10/18/2018 17  6 - 23 mg/dL Final  . Creatinine, Ser 10/18/2018 1.06  0.40 - 1.20 mg/dL Final  . Calcium 10/18/2018 9.5  8.4 - 10.5 mg/dL Final  . GFR 10/18/2018 61.36  >60.00 mL/min Final  . Hgb A1c MFr Bld 10/18/2018 6.0  4.6 - 6.5 % Final   Glycemic Control Guidelines for People with Diabetes:Non Diabetic:  <6%Goal of Therapy: <7%Additional Action Suggested:  >8%     Allergies as of 10/20/2018      Reactions   Codeine Phosphate    REACTION: unspecified, nausea, vomiting      Medication List  Accurate as of Oct 20, 2018 11:22 AM. Always use your most recent med list.        aspirin 81 MG tablet Take 81 mg by mouth daily.   bisoprolol-hydrochlorothiazide 2.5-6.25 MG tablet Commonly known as:  ZIAC Take 1 tablet by mouth daily.   dexlansoprazole 60 MG capsule Commonly known as:  Dexilant Take 1 capsule (60 mg total) by mouth daily.   dicyclomine 20 MG tablet Commonly known as:  BENTYL TAKE 1 TABLET BY MOUTH 4  TIMES DAILY BEFORE MEALS  AND AT BEDTIME   gabapentin 300 MG capsule Commonly known as:  NEURONTIN Take 1 capsule (300 mg total) by mouth 3 (three) times daily.   glucose blood test strip Commonly known as:  ONE TOUCH ULTRA TEST USE AS DIRECTED EVERY DAY Dx code E11.9   glucose blood test strip Use to check blood sugar one time daily   OneTouch Verio  test strip Generic drug:  glucose blood USE  STRIP TO CHECK GLUCOSE ONCE DAILY   HYDROcodone-acetaminophen 5-325 MG tablet Commonly known as:  NORCO/VICODIN Take 1 tablet by mouth every 6 (six) hours as needed.   losartan 50 MG tablet Commonly known as:  COZAAR TAKE 1 TABLET BY MOUTH  DAILY   metFORMIN 500 MG tablet Commonly known as:  GLUCOPHAGE TAKE 1 TABLET BY MOUTH TWO  TIMES DAILY WITH MEALS   multivitamin tablet Take 1 tablet by mouth daily.   OneTouch Delica Lancets 62G Misc Use to check blood sugar 2 times a week .Dx code E11.9   pioglitazone 15 MG tablet Commonly known as:  ACTOS Take 1 tablet (15 mg total) by mouth daily.   pravastatin 20 MG tablet Commonly known as:  PRAVACHOL TAKE 1 TABLET BY MOUTH  DAILY   verapamil 240 MG CR tablet Commonly known as:  CALAN-SR Take 1 tablet (240 mg total) by mouth at bedtime.   vitamin C 1000 MG tablet Take 1,000 mg by mouth daily.   Vitamin D3 25 MCG (1000 UT) Caps Take 1 capsule by mouth daily.       Allergies:  Allergies  Allergen Reactions  . Codeine Phosphate     REACTION: unspecified, nausea, vomiting    Past Medical History:  Diagnosis Date  . Anemia    long ago  . Arthritis   . Diabetes mellitus   . Diverticulosis of colon (without mention of hemorrhage)   . Elevated cholesterol   . Fibroid   . Gastritis   . GERD (gastroesophageal reflux disease)   . Hiatal hernia   . Hypertension   . IBS (irritable bowel syndrome)   . Positive ANA (antinuclear antibody) 04/23/2012  . Stricture and stenosis of esophagus 09/03/2005  . Urinary incontinence     Past Surgical History:  Procedure Laterality Date  . ABDOMINAL HYSTERECTOMY  1985  . BACK SURGERY  11/2016  . CHOLECYSTECTOMY    . COLONOSCOPY  12/15/2007   diverticulosis  . ESOPHAGOGASTRODUODENOSCOPY  12/24/2012   normal   . KNEE SURGERY Right    arthroscopic  . UPPER GASTROINTESTINAL ENDOSCOPY      Family History  Problem Relation Age of  Onset  . Diabetes Mother   . Hypertension Mother   . Stroke Brother        x 2  . Bipolar disorder Brother   . Stroke Brother   . Diabetes Brother   . Breast cancer Maternal Aunt        Age 52's  . Colon cancer Neg  Hx   . Stomach cancer Neg Hx   . Throat cancer Neg Hx   . Liver disease Neg Hx   . Colon polyps Neg Hx   . Esophageal cancer Neg Hx   . Rectal cancer Neg Hx     Social History:  reports that she has never smoked. She has never used smokeless tobacco. She reports that she does not drink alcohol or use drugs.  Review of Systems:  HYPERTENSION:   She takes losartan 50 mg, Verapamil 240 mg and Ziac 2.5 Blood pressure is consistently controlled  Home BP checked only rarely Today her blood pressure at home is 126/77, using an arm cuff  BP Readings from Last 3 Encounters:  08/25/18 120/70  07/29/18 130/68  06/25/18 132/76   Renal functions have been stable along with potassium:  Lab Results  Component Value Date   CREATININE 1.06 10/18/2018   BUN 17 10/18/2018   NA 141 10/18/2018   K 3.7 10/18/2018   CL 106 10/18/2018   CO2 24 10/18/2018    HYPERLIPIDEMIA: The lipid abnormality consists of elevated LDL which is well-controlled on pravastatin 20 mg Also followed by PCP  Lab Results  Component Value Date   CHOL 139 06/25/2018   HDL 56.20 06/25/2018   LDLCALC 63 06/25/2018   TRIG 100.0 06/25/2018   CHOLHDL 2 06/25/2018     Last eye exam was 05/2018   Examination:   There were no vitals taken for this visit.  There is no height or weight on file to calculate BMI.     ASSESSMENT/ PLAN:   Diabetes type 2 with  obesity  Her A1c is again normal at 6%, compared to 6.2 earlier this year  She is on metformin 1000 mg a day and  15 mg Actos for several years without any adverse effects  Blood sugars at home are being checked before and after meals are again normal Encourage her to start walking around the block for exercise and keeping her weight  down She will call us if she has the need for changing her blood sugar monitor because of insurance reasons   Hypertension:  well controlled with bisoprolol/HCT 2.5 mg, verapamil and losartan 50 mg She will continue the 3 drug regimen and also reminded her to check periodically at home  .  Follow-up in 6 months    Yancey Pedley 10/20/2018, 11:22 AM

## 2018-10-20 NOTE — Telephone Encounter (Signed)
Patient called stating she took her blood pressure again and it was 126/77

## 2018-10-20 NOTE — Telephone Encounter (Signed)
Noted  

## 2018-10-21 ENCOUNTER — Other Ambulatory Visit: Payer: Self-pay

## 2018-10-21 IMAGING — MG DIGITAL SCREENING BILATERAL MAMMOGRAM WITH TOMO AND CAD
8 series · 8 of 24 positions shown · non-contrast
Comparison: Previous exam(s).

CLINICAL DATA: Screening.

EXAM:
DIGITAL SCREENING BILATERAL MAMMOGRAM WITH TOMO AND CAD

[R CC synth-2D]
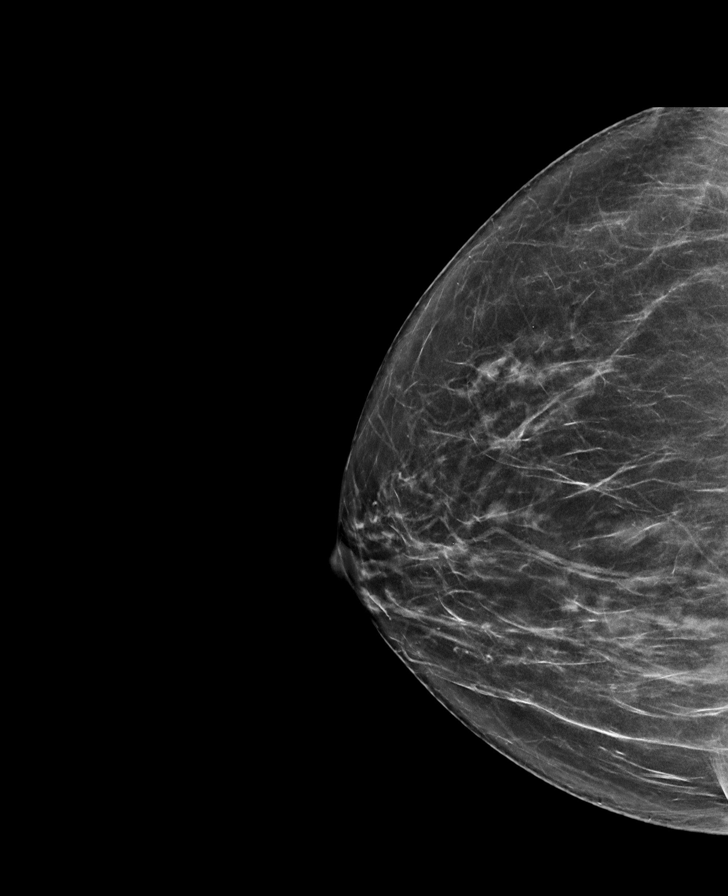

[L CC synth-2D]
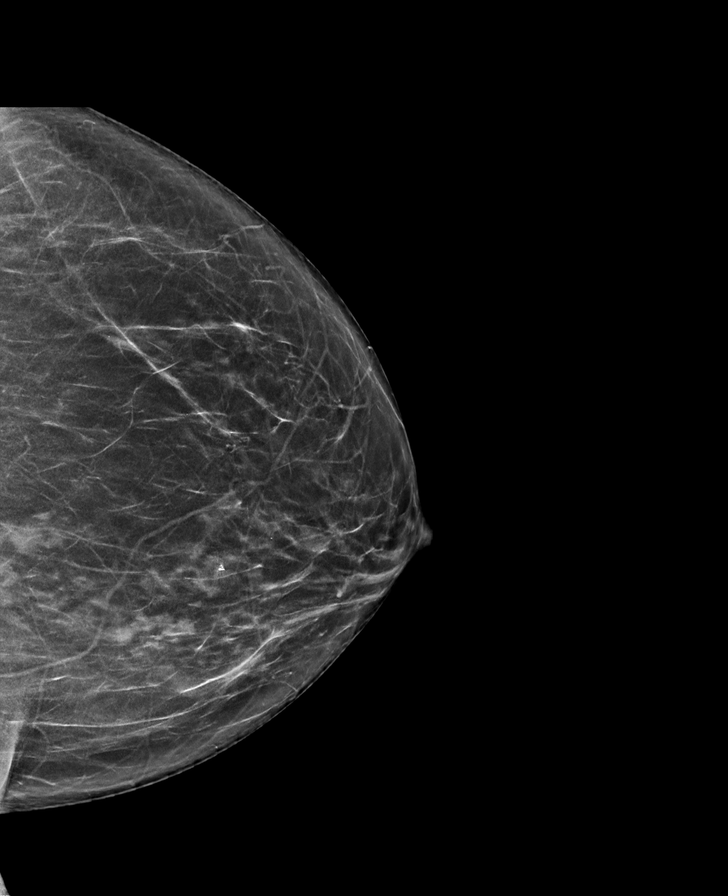

[L MLO synth-2D]
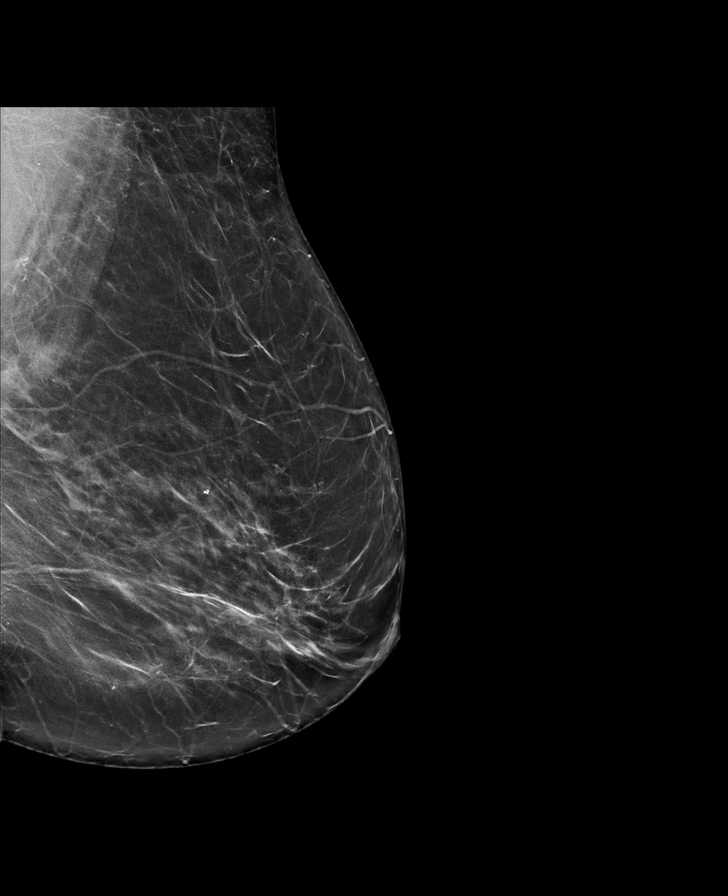

[R MLO synth-2D]
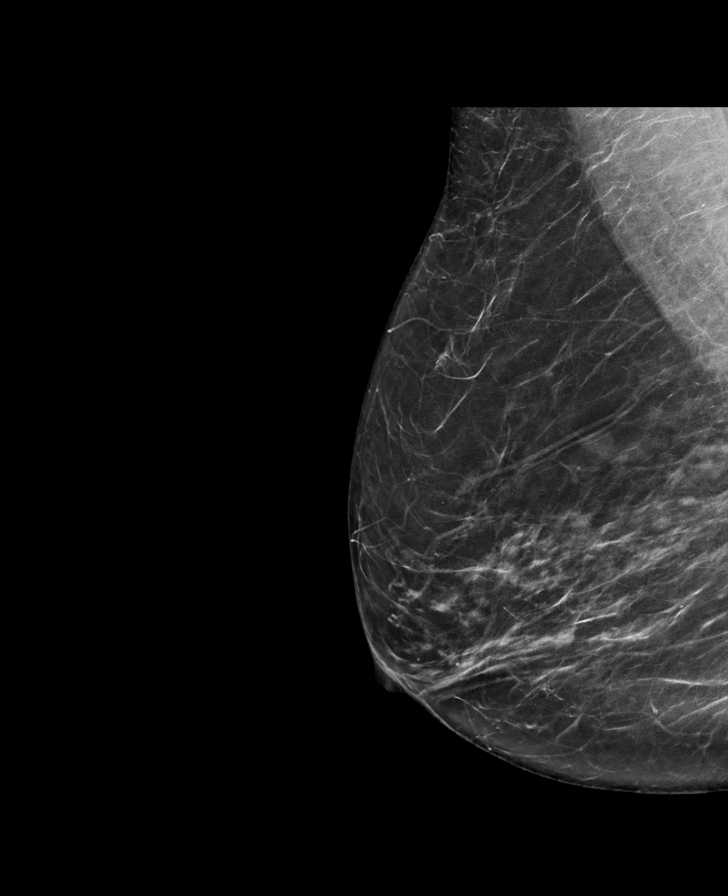

[R MLO tomo · tomo slice 39/76.0]
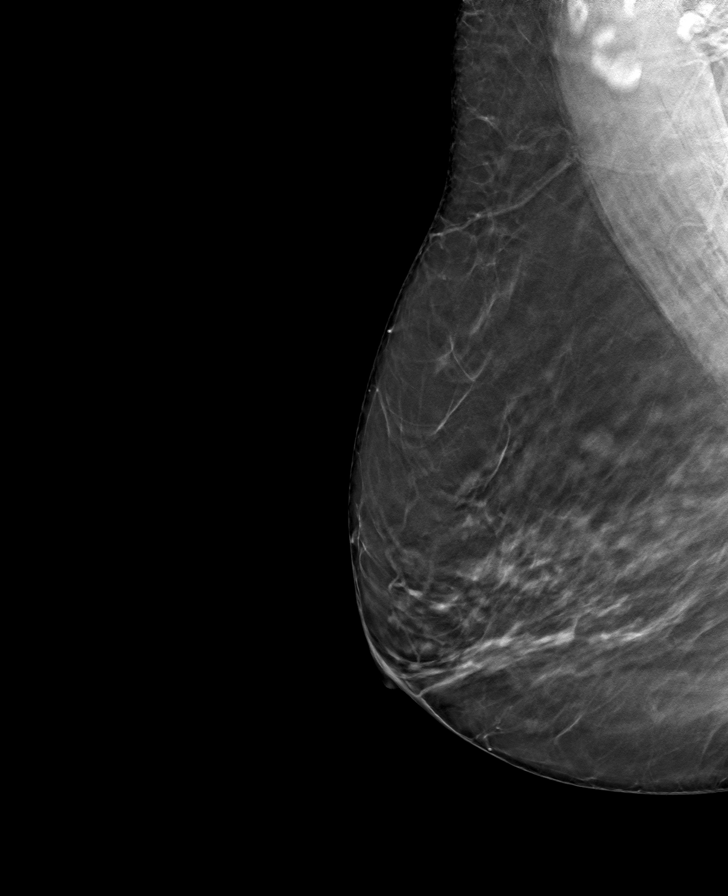

[R CC tomo · tomo slice 37/74.0]
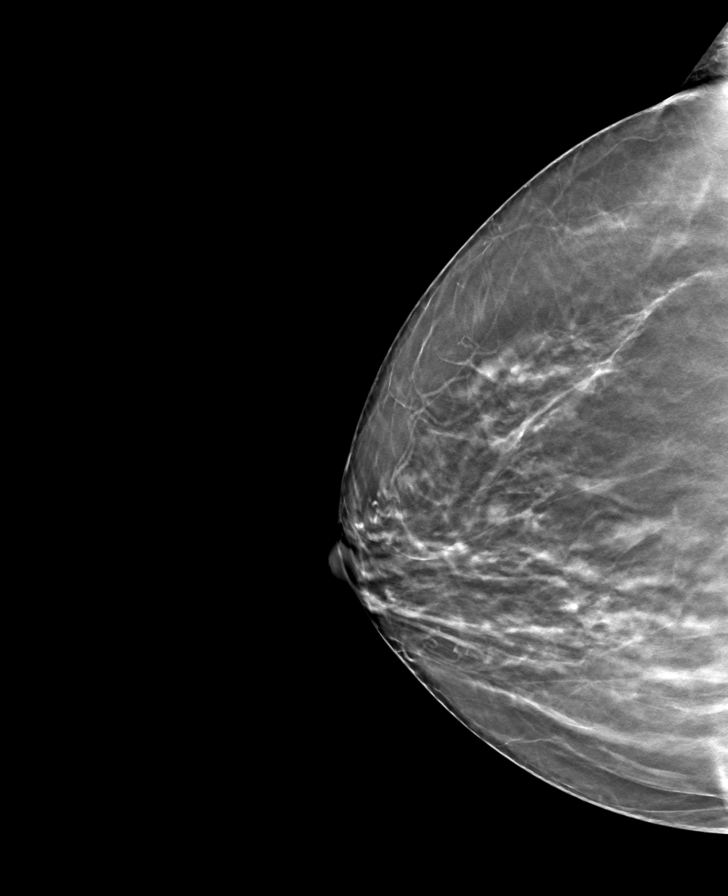

[L CC tomo · tomo slice 35/70.0]
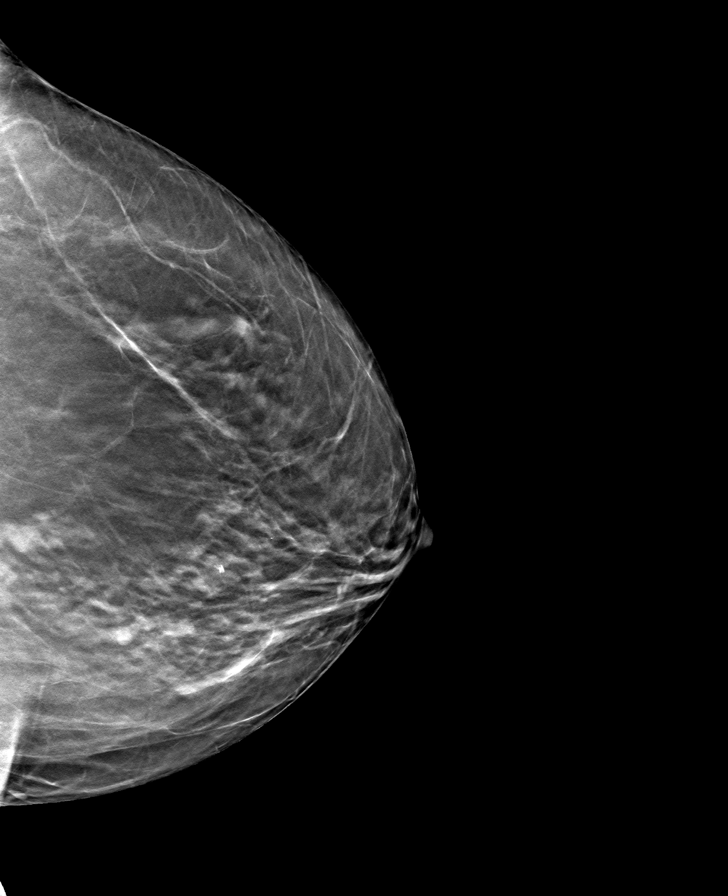

[L MLO tomo · tomo slice 40/79.0]
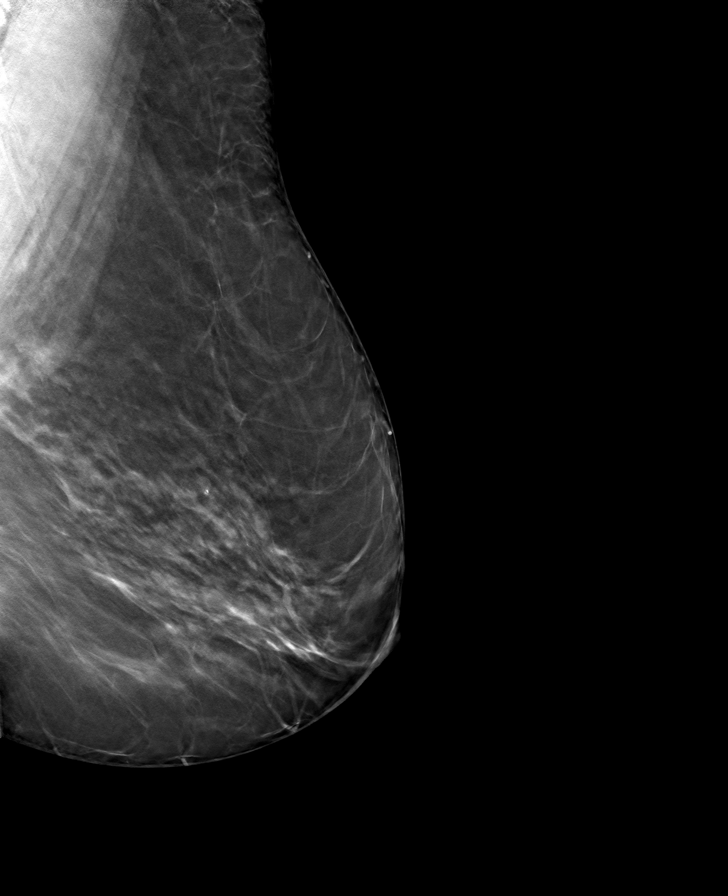

[8 of 24 positions shown; findings below may reference images not displayed]

ACR Breast Density Category b: There are scattered areas of
fibroglandular density.
FINDINGS: There are no findings suspicious for malignancy. Images were
processed with CAD.
IMPRESSION: No mammographic evidence of malignancy. A result letter of this
screening mammogram will be mailed directly to the patient.

RECOMMENDATION:
Screening mammogram in one year. (Code:[id number])

BI-RADS CATEGORY  1: Negative.

## 2018-10-21 MED ORDER — BD SWAB SINGLE USE REGULAR PADS: MEDICATED_PAD | 1 refills | Status: DC

## 2018-10-21 MED ORDER — ACCU-CHEK AVIVA PLUS W/DEVICE KIT: PACK | 0 refills | Status: DC

## 2018-10-21 MED ORDER — ACCU-CHEK SOFTCLIX LANCETS MISC: 1 refills | Status: DC

## 2018-10-21 MED ORDER — ACCU-CHEK AVIVA VI SOLN: 0 refills | Status: DC

## 2018-10-21 MED ORDER — GLUCOSE BLOOD VI STRP: ORAL_STRIP | 1 refills | Status: DC

## 2018-10-22 ENCOUNTER — Other Ambulatory Visit: Payer: Self-pay

## 2018-10-22 ENCOUNTER — Telehealth: Payer: Self-pay | Admitting: Endocrinology

## 2018-10-22 MED ORDER — PRAVASTATIN SODIUM 20 MG PO TABS: 20.0000 mg | ORAL_TABLET | Freq: Every day | ORAL | 1 refills | Status: DC

## 2018-10-22 NOTE — Telephone Encounter (Signed)
This was refilled today #90 x 1 rf Nothing further needed.

## 2018-10-22 NOTE — Telephone Encounter (Signed)
MEDICATION: pravastatin  PHARMACY:  [facility]  IS THIS A 90 DAY SUPPLY : n needs 30 day  IS PATIENT OUT OF MEDICATION: n  IF NOT; HOW MUCH IS LEFT: has 3 pills left  LAST APPOINTMENT DATE: @5 /12/2018  NEXT APPOINTMENT DATE:@11 /07/2018  DO WE HAVE YOUR PERMISSION TO LEAVE A DETAILED MESSAGE:  OTHER COMMENTS:    **Let patient know to contact pharmacy at the end of the day to make sure medication is ready. **  ** Please notify patient to allow 48-72 hours to process**  **Encourage patient to contact the pharmacy for refills or they can request refills through [facility]**

## 2018-10-25 ENCOUNTER — Other Ambulatory Visit: Payer: Self-pay

## 2018-10-25 MED ORDER — PRAVASTATIN SODIUM 20 MG PO TABS: ORAL_TABLET | ORAL | 1 refills | Status: DC

## 2018-10-25 NOTE — Telephone Encounter (Signed)
Rx sent with DX code.

## 2018-10-25 NOTE — Telephone Encounter (Signed)
Patient states the pharmacy is needing a DX code due to insurance purposes.  Please Advise, Thanks

## 2018-11-14 ENCOUNTER — Other Ambulatory Visit: Payer: Self-pay | Admitting: Endocrinology

## 2018-12-28 ENCOUNTER — Encounter: Payer: Self-pay | Admitting: Internal Medicine

## 2018-12-28 ENCOUNTER — Other Ambulatory Visit: Payer: Self-pay

## 2018-12-28 ENCOUNTER — Ambulatory Visit (INDEPENDENT_AMBULATORY_CARE_PROVIDER_SITE_OTHER): Payer: Medicare Other | Admitting: Internal Medicine

## 2018-12-28 VITALS — BP 142/84 | HR 88 | Temp 98.7°F | Ht 65.0 in | Wt 196.0 lb

## 2018-12-28 DIAGNOSIS — I10 Essential (primary) hypertension: Secondary | ICD-10-CM | POA: Diagnosis not present

## 2018-12-28 DIAGNOSIS — E559 Vitamin D deficiency, unspecified: Secondary | ICD-10-CM | POA: Diagnosis not present

## 2018-12-28 DIAGNOSIS — E119 Type 2 diabetes mellitus without complications: Secondary | ICD-10-CM | POA: Diagnosis not present

## 2018-12-28 DIAGNOSIS — E611 Iron deficiency: Secondary | ICD-10-CM | POA: Diagnosis not present

## 2018-12-28 DIAGNOSIS — Z0000 Encounter for general adult medical examination without abnormal findings: Secondary | ICD-10-CM

## 2018-12-28 DIAGNOSIS — E785 Hyperlipidemia, unspecified: Secondary | ICD-10-CM

## 2018-12-28 DIAGNOSIS — E538 Deficiency of other specified B group vitamins: Secondary | ICD-10-CM

## 2018-12-28 NOTE — Patient Instructions (Signed)
Please continue all other medications as before, and refills have been done if requested.  Please have the pharmacy call with any other refills you may need.  Please continue your efforts at being more active, low cholesterol diet, and weight control.  You are otherwise up to date with prevention measures today.  Please keep your appointments with your specialists as you may have planned  Please return in 6 months, or sooner if needed, with Lab testing done 3-5 days before  

## 2018-12-28 NOTE — Assessment & Plan Note (Signed)
stable overall by history and exam, recent data reviewed with pt, and pt to continue medical treatment as before,  to f/u any worsening symptoms or concerns  

## 2018-12-28 NOTE — Progress Notes (Signed)
Subjective:    Patient ID: Daisy Dillon, female    DOB: 1945/11/02, 73 y.o.   MRN: [id number]  HPI   Here to f/u; overall doing ok,  Pt denies chest pain, increasing sob or doe, wheezing, orthopnea, PND, increased LE swelling, palpitations, dizziness or syncope.  Pt denies new neurological symptoms such as new headache, or facial or extremity weakness or numbness.  Pt denies polydipsia, polyuria, or low sugar episode.  Pt states overall good compliance with meds, mostly trying to follow appropriate diet, with wt overall stable,  but little exercise however.  BP at home < 140/90.  No new complaints Past Medical History:  Diagnosis Date  . Anemia    long ago  . Arthritis   . Diabetes mellitus   . Diverticulosis of colon (without mention of hemorrhage)   . Elevated cholesterol   . Fibroid   . Gastritis   . GERD (gastroesophageal reflux disease)   . Hiatal hernia   . Hypertension   . IBS (irritable bowel syndrome)   . Positive ANA (antinuclear antibody) 04/23/2012  . Stricture and stenosis of esophagus 09/03/2005  . Urinary incontinence    Past Surgical History:  Procedure Laterality Date  . ABDOMINAL HYSTERECTOMY  1985  . BACK SURGERY  11/2016  . CHOLECYSTECTOMY    . COLONOSCOPY  12/15/2007   diverticulosis  . ESOPHAGOGASTRODUODENOSCOPY  12/24/2012   normal   . KNEE SURGERY Right    arthroscopic  . UPPER GASTROINTESTINAL ENDOSCOPY      reports that she has never smoked. She has never used smokeless tobacco. She reports that she does not drink alcohol or use drugs. family history includes Bipolar disorder in her brother; Breast cancer in her maternal aunt; Diabetes in her brother and mother; Hypertension in her mother; Stroke in her brother and brother. Allergies  Allergen Reactions  . Codeine Phosphate     REACTION: unspecified, nausea, vomiting   Current Outpatient Medications on File Prior to Visit  Medication Sig Dispense Refill  . Accu-Chek Softclix Lancets  lancets Use accu chek softclix lancets as instructed to check blood sugar once daily. DX:E11.9 100 each 1  . Alcohol Swabs (B-D SINGLE USE SWABS REGULAR) PADS Use as instructed. 100 each 1  . Ascorbic Acid (VITAMIN C) 1000 MG tablet Take 1,000 mg by mouth daily.    Marland Kitchen aspirin 81 MG tablet Take 81 mg by mouth daily.      . bisoprolol-hydrochlorothiazide (ZIAC) 2.5-6.25 MG tablet Take 1 tablet by mouth daily. 90 tablet 1  . Blood Glucose Calibration (ACCU-CHEK AVIVA) SOLN Use as instructed. 1 each 0  . Blood Glucose Monitoring Suppl (ACCU-CHEK AVIVA PLUS) w/Device KIT Use as instructed to check blood sugar everyday. DX:E11.9 1 kit 0  . Cholecalciferol (VITAMIN D3) 1000 UNITS CAPS Take 1 capsule by mouth daily.      Marland Kitchen dexlansoprazole (DEXILANT) 60 MG capsule Take 1 capsule (60 mg total) by mouth daily. 90 capsule 3  . dicyclomine (BENTYL) 20 MG tablet TAKE 1 TABLET BY MOUTH 4  TIMES DAILY BEFORE MEALS  AND AT BEDTIME 180 tablet 1  . gabapentin (NEURONTIN) 300 MG capsule Take 1 capsule (300 mg total) by mouth 3 (three) times daily. 270 capsule 1  . glucose blood test strip Use accu chek aviva plus test strip as instructed to check blood sugar once daily. DX:E11.9 100 each 1  . HYDROcodone-acetaminophen (NORCO/VICODIN) 5-325 MG tablet Take 1 tablet by mouth every 6 (six) hours as needed. 30 tablet  0  . losartan (COZAAR) 50 MG tablet TAKE 1 TABLET BY MOUTH  DAILY 90 tablet 0  . metFORMIN (GLUCOPHAGE) 500 MG tablet TAKE 1 TABLET BY MOUTH TWO  TIMES DAILY WITH MEALS 180 tablet 2  . Multiple Vitamin (MULTIVITAMIN) tablet Take 1 tablet by mouth daily.      . pioglitazone (ACTOS) 15 MG tablet TAKE 1 TABLET BY MOUTH  DAILY 90 tablet 1  . pravastatin (PRAVACHOL) 20 MG tablet Take 1 tablet by mouth once daily. DX:E11.9 90 tablet 1  . verapamil (CALAN-SR) 240 MG CR tablet TAKE 1 TABLET BY MOUTH AT  BEDTIME 90 tablet 0   No current facility-administered medications on file prior to visit.    Review of Systems   Constitutional: Negative for other unusual diaphoresis or sweats HENT: Negative for ear discharge or swelling Eyes: Negative for other worsening visual disturbances Respiratory: Negative for stridor or other swelling  Gastrointestinal: Negative for worsening distension or other blood Genitourinary: Negative for retention or other urinary change Musculoskeletal: Negative for other MSK pain or swelling Skin: Negative for color change or other new lesions Neurological: Negative for worsening tremors and other numbness  Psychiatric/Behavioral: Negative for worsening agitation or other fatigue All other system neg per pt    Objective:   Physical Exam BP (!) 142/84   Pulse 88   Temp 98.7 F (37.1 C) (Oral)   Ht '5\' 5"'$  (1.651 m)   Wt 196 lb (88.9 kg)   SpO2 97%   BMI 32.62 kg/m  VS noted,  Constitutional: Pt appears in NAD HENT: Head: NCAT.  Right Ear: External ear normal.  Left Ear: External ear normal.  Eyes: . Pupils are equal, round, and reactive to light. Conjunctivae and EOM are normal Nose: without d/c or deformity Neck: Neck supple. Gross normal ROM Cardiovascular: Normal rate and regular rhythm.   Pulmonary/Chest: Effort normal and breath sounds without rales or wheezing.  Abd:  Soft, NT, ND, + BS, no organomegaly Neurological: Pt is alert. At baseline orientation, motor grossly intact Skin: Skin is warm. No rashes, other new lesions, no LE edema Psychiatric: Pt behavior is normal without agitation  No other exam fdndings Lab Results  Component Value Date   WBC 3.3 (L) 06/25/2018   HGB 12.5 06/25/2018   HCT 37.7 06/25/2018   PLT 253.0 06/25/2018   GLUCOSE 91 10/18/2018   CHOL 139 06/25/2018   TRIG 100.0 06/25/2018   HDL 56.20 06/25/2018   LDLCALC 63 06/25/2018   ALT 38 (H) 06/25/2018   AST 26 06/25/2018   NA 141 10/18/2018   K 3.7 10/18/2018   CL 106 10/18/2018   CREATININE 1.06 10/18/2018   BUN 17 10/18/2018   CO2 24 10/18/2018   TSH 1.22 06/25/2018   INR  1.12 02/07/2018   HGBA1C 6.0 10/18/2018   MICROALBUR 0.9 06/25/2018        Assessment & Plan:

## 2019-01-11 ENCOUNTER — Telehealth: Payer: Self-pay | Admitting: Gastroenterology

## 2019-01-11 MED ORDER — DICYCLOMINE HCL 20 MG PO TABS: ORAL_TABLET | ORAL | 0 refills | Status: DC

## 2019-01-11 NOTE — Telephone Encounter (Signed)
Patient called would like to know if we have any samples for dicyclomine

## 2019-01-11 NOTE — Telephone Encounter (Signed)
OK to send 2 week refill

## 2019-01-11 NOTE — Telephone Encounter (Signed)
Called patient to inform we do not have samples of  Dicyclomine,  Patient needs supply of two weeks sent to local pharmacy to last until her mail order comes in on 8/11  Is it ok to send ?   Patient has not been seen in the office since 2018 but had a procedure in 2019

## 2019-01-11 NOTE — Telephone Encounter (Signed)
Called patient to inform 2 week supply sent to pharmacy [facility]

## 2019-01-12 ENCOUNTER — Other Ambulatory Visit: Payer: Self-pay | Admitting: Endocrinology

## 2019-01-13 ENCOUNTER — Other Ambulatory Visit: Payer: Self-pay

## 2019-01-13 MED ORDER — DICYCLOMINE HCL 20 MG PO TABS: ORAL_TABLET | ORAL | 0 refills | Status: DC

## 2019-01-14 ENCOUNTER — Other Ambulatory Visit: Payer: Self-pay | Admitting: Endocrinology

## 2019-01-17 ENCOUNTER — Other Ambulatory Visit: Payer: Self-pay

## 2019-01-17 MED ORDER — LOSARTAN POTASSIUM 50 MG PO TABS: 50.0000 mg | ORAL_TABLET | Freq: Every day | ORAL | 0 refills | Status: DC

## 2019-01-27 ENCOUNTER — Other Ambulatory Visit: Payer: Self-pay | Admitting: Endocrinology

## 2019-02-03 ENCOUNTER — Other Ambulatory Visit: Payer: Self-pay | Admitting: Endocrinology

## 2019-02-24 ENCOUNTER — Other Ambulatory Visit: Payer: Self-pay | Admitting: Gynecology

## 2019-02-24 DIAGNOSIS — Z1231 Encounter for screening mammogram for malignant neoplasm of breast: Secondary | ICD-10-CM

## 2019-02-25 ENCOUNTER — Other Ambulatory Visit: Payer: Self-pay | Admitting: Obstetrics & Gynecology

## 2019-02-25 DIAGNOSIS — Z1231 Encounter for screening mammogram for malignant neoplasm of breast: Secondary | ICD-10-CM

## 2019-03-04 ENCOUNTER — Telehealth: Payer: Self-pay | Admitting: Endocrinology

## 2019-03-04 NOTE — Telephone Encounter (Signed)
Pt has lost her meter she had the one touch verio meter, can you call in a new one please to [facility] on [address]

## 2019-03-07 ENCOUNTER — Other Ambulatory Visit: Payer: Self-pay

## 2019-03-07 MED ORDER — ONETOUCH VERIO FLEX SYSTEM W/DEVICE KIT: PACK | 0 refills | Status: DC

## 2019-03-07 NOTE — Telephone Encounter (Signed)
Rx sent 

## 2019-03-24 ENCOUNTER — Encounter: Payer: Self-pay | Admitting: Gynecology

## 2019-04-03 ENCOUNTER — Other Ambulatory Visit: Payer: Self-pay | Admitting: Endocrinology

## 2019-04-08 ENCOUNTER — Ambulatory Visit: Payer: Medicare Other

## 2019-04-13 ENCOUNTER — Telehealth: Payer: Self-pay

## 2019-04-13 NOTE — Telephone Encounter (Signed)
Left message with patient to return my call.

## 2019-04-13 NOTE — Telephone Encounter (Signed)
Informed patient I received her patient assistance by fax from [place] and filled out our part but she can come by our office to get the paper work to do her part. Patient states she will come by tomorrow to get the forms. Also informed patient she is due for a follow up visit. Patient scheduled follow up for 05/31/19.

## 2019-04-14 ENCOUNTER — Other Ambulatory Visit: Payer: Self-pay

## 2019-04-14 ENCOUNTER — Ambulatory Visit
Admission: RE | Admit: 2019-04-14 | Discharge: 2019-04-14 | Disposition: A | Discharge status: Home / Self Care | Payer: Medicare Other | Source: Ambulatory Visit | Attending: Gynecology | Admitting: Gynecology

## 2019-04-14 DIAGNOSIS — Z1231 Encounter for screening mammogram for malignant neoplasm of breast: Secondary | ICD-10-CM

## 2019-04-18 ENCOUNTER — Other Ambulatory Visit: Payer: Self-pay

## 2019-04-18 ENCOUNTER — Other Ambulatory Visit (INDEPENDENT_AMBULATORY_CARE_PROVIDER_SITE_OTHER): Payer: Medicare Other

## 2019-04-18 DIAGNOSIS — E119 Type 2 diabetes mellitus without complications: Secondary | ICD-10-CM

## 2019-04-18 LAB — BASIC METABOLIC PANEL
BUN: 18 mg/dL (ref 6–23)
CO2: 26 mEq/L (ref 19–32)
Calcium: 9.9 mg/dL (ref 8.4–10.5)
Chloride: 104 mEq/L (ref 96–112)
Creatinine, Ser: 1.05 mg/dL (ref 0.40–1.20)
GFR: 61.95 mL/min (ref >60.00)
Glucose, Bld: 93 mg/dL (ref 70–99)
Potassium: 3.6 mEq/L (ref 3.5–5.1)
Sodium: 140 mEq/L (ref 135–145)

## 2019-04-18 LAB — HEMOGLOBIN A1C: Hgb A1c MFr Bld: 5.7 % (ref 4.6–6.5)

## 2019-04-21 ENCOUNTER — Encounter: Payer: Self-pay | Admitting: Endocrinology

## 2019-04-21 ENCOUNTER — Ambulatory Visit (INDEPENDENT_AMBULATORY_CARE_PROVIDER_SITE_OTHER): Payer: Medicare Other | Admitting: Endocrinology

## 2019-04-21 ENCOUNTER — Other Ambulatory Visit: Payer: Self-pay

## 2019-04-21 VITALS — BP 136/70 | HR 83 | Ht 65.0 in | Wt 197.6 lb

## 2019-04-21 DIAGNOSIS — E1169 Type 2 diabetes mellitus with other specified complication: Secondary | ICD-10-CM

## 2019-04-21 DIAGNOSIS — Z6833 Body mass index (BMI) 33.0-33.9, adult: Secondary | ICD-10-CM | POA: Diagnosis not present

## 2019-04-21 DIAGNOSIS — Z23 Encounter for immunization: Secondary | ICD-10-CM

## 2019-04-21 DIAGNOSIS — E669 Obesity, unspecified: Secondary | ICD-10-CM

## 2019-04-21 DIAGNOSIS — E7800 Pure hypercholesterolemia, unspecified: Secondary | ICD-10-CM | POA: Diagnosis not present

## 2019-04-21 NOTE — Progress Notes (Signed)
Patient ID: Daisy Dillon, female   DOB: 06/16/1946, 73 y.o.   MRN: [id number]   Reason for Appointment: follow-up   History of Present Illness   Diagnosis: Type 2 DIABETES MELITUS, date of diagnosis:   2002   She has had stable long-term control of her mild diabetes with combination of low-doses of Actos and metformin  She usually has had a normal A1c and this stays in the 5.7- 6% range  A1c is now 5.7, previously 6%  Current management: Blood sugar patterns and history:   Blood sugars are near normal most of the time again  As before she is checking her sugars before and about 2 hours after various meals  Usually not doing fasting readings  Her morning reading in the lab was 93  She is appearing to be losing weight in the last few months  This is despite not getting any exercise because of back pain  She is usually trying to watch her portions and carbohydrates  Side effects from medications: None  Monitors blood glucose: Less than once a day .    Glucometer: One touch     Blood Glucose readings from download:  Blood sugar range 78-137  AVERAGE 97 Only 1 relatively high reading before lunch           Complications: are: No history of neuropathy     Wt Readings from Last 3 Encounters:  04/21/19 197 lb 9.6 oz (89.6 kg)  12/28/18 196 lb (88.9 kg)  08/25/18 206 lb (93.4 kg)    Lab Results  Component Value Date   HGBA1C 5.7 04/18/2019   HGBA1C 6.0 10/18/2018   HGBA1C 6.2 06/25/2018   Lab Results  Component Value Date   MICROALBUR 0.9 06/25/2018   LDLCALC 63 06/25/2018   CREATININE 1.05 04/18/2019    OTHER active problems: See review of systems   Lab on 04/18/2019  Component Date Value Ref Range Status  . Sodium 04/18/2019 140  135 - 145 mEq/L Final  . Potassium 04/18/2019 3.6  3.5 - 5.1 mEq/L Final  . Chloride 04/18/2019 104  96 - 112 mEq/L Final  . CO2 04/18/2019 26  19 - 32 mEq/L Final  . Glucose, Bld 04/18/2019 93  70 - 99  mg/dL Final  . BUN 04/18/2019 18  6 - 23 mg/dL Final  . Creatinine, Ser 04/18/2019 1.05  0.40 - 1.20 mg/dL Final  . Calcium 04/18/2019 9.9  8.4 - 10.5 mg/dL Final  . GFR 04/18/2019 61.95  >60.00 mL/min Final  . Hgb A1c MFr Bld 04/18/2019 5.7  4.6 - 6.5 % Final   Glycemic Control Guidelines for People with Diabetes:Non Diabetic:  <6%Goal of Therapy: <7%Additional Action Suggested:  >8%     Allergies as of 04/21/2019      Reactions   Codeine Phosphate    REACTION: unspecified, nausea, vomiting      Medication List       Accurate as of April 21, 2019  2:03 PM. If you have any questions, ask your nurse or doctor.        aspirin 81 MG tablet Take 81 mg by mouth daily.   B-D SINGLE USE SWABS REGULAR Pads Use as instructed.   bisoprolol-hydrochlorothiazide 2.5-6.25 MG tablet Commonly known as: ZIAC TAKE 1 TABLET BY MOUTH  DAILY   dexlansoprazole 60 MG capsule Commonly known as: Dexilant Take 1 capsule (60 mg total) by mouth daily.   dicyclomine 20 MG tablet Commonly known as: BENTYL One tablet  by mouth four times a day before meals and at bedtime   gabapentin 300 MG capsule Commonly known as: NEURONTIN Take 1 capsule (300 mg total) by mouth 3 (three) times daily.   glucose blood test strip Use accu chek aviva plus test strip as instructed to check blood sugar once daily. DX:E11.9   HYDROcodone-acetaminophen 5-325 MG tablet Commonly known as: NORCO/VICODIN Take 1 tablet by mouth every 6 (six) hours as needed.   losartan 50 MG tablet Commonly known as: COZAAR TAKE 1 TABLET BY MOUTH  DAILY   metFORMIN 500 MG tablet Commonly known as: GLUCOPHAGE TAKE 1 TABLET BY MOUTH TWO  TIMES DAILY WITH MEALS   multivitamin tablet Take 1 tablet by mouth daily.   OneTouch Verio Flex System w/Device Kit Use as instructed to check blood sugar once daily. DX:E11.9   pioglitazone 15 MG tablet Commonly known as: ACTOS TAKE 1 TABLET BY MOUTH  DAILY   pravastatin 20 MG tablet  Commonly known as: PRAVACHOL TAKE 1 TABLET BY MOUTH  DAILY   verapamil 240 MG CR tablet Commonly known as: CALAN-SR TAKE 1 TABLET BY MOUTH AT  BEDTIME   vitamin C 1000 MG tablet Take 1,000 mg by mouth daily.   Vitamin D3 25 MCG (1000 UT) Caps Take 1 capsule by mouth daily.       Allergies:  Allergies  Allergen Reactions  . Codeine Phosphate     REACTION: unspecified, nausea, vomiting    Past Medical History:  Diagnosis Date  . Anemia    long ago  . Arthritis   . Diabetes mellitus   . Diverticulosis of colon (without mention of hemorrhage)   . Elevated cholesterol   . Fibroid   . Gastritis   . GERD (gastroesophageal reflux disease)   . Hiatal hernia   . Hypertension   . IBS (irritable bowel syndrome)   . Positive ANA (antinuclear antibody) 04/23/2012  . Stricture and stenosis of esophagus 09/03/2005  . Urinary incontinence     Past Surgical History:  Procedure Laterality Date  . ABDOMINAL HYSTERECTOMY  1985  . BACK SURGERY  11/2016  . CHOLECYSTECTOMY    . COLONOSCOPY  12/15/2007   diverticulosis  . ESOPHAGOGASTRODUODENOSCOPY  12/24/2012   normal   . KNEE SURGERY Right    arthroscopic  . UPPER GASTROINTESTINAL ENDOSCOPY      Family History  Problem Relation Age of Onset  . Diabetes Mother   . Hypertension Mother   . Stroke Brother        x 2  . Bipolar disorder Brother   . Stroke Brother   . Diabetes Brother   . Breast cancer Maternal Aunt        Age 69's  . Colon cancer Neg Hx   . Stomach cancer Neg Hx   . Throat cancer Neg Hx   . Liver disease Neg Hx   . Colon polyps Neg Hx   . Esophageal cancer Neg Hx   . Rectal cancer Neg Hx     Social History:  reports that she has never smoked. She has never used smokeless tobacco. She reports that she does not drink alcohol or use drugs.  Review of Systems:  HYPERTENSION:   She takes losartan 50 mg, Verapamil 240 mg and Ziac 2.5 Blood pressure is consistently controlled  Home BP checked  occasionally  The blood pressure at home is recently 132/74, using an arm cuff  BP Readings from Last 3 Encounters:  04/21/19 136/70  12/28/18 (!) 142/84  08/25/18 120/70   Renal functions have been stable, also normal potassium:  Lab Results  Component Value Date   CREATININE 1.05 04/18/2019   BUN 18 04/18/2019   NA 140 04/18/2019   K 3.6 04/18/2019   CL 104 04/18/2019   CO2 26 04/18/2019    HYPERLIPIDEMIA: The lipid abnormality consists of elevated LDL which is well-controlled on pravastatin 20 mg Also followed by PCP  Lab Results  Component Value Date   CHOL 139 06/25/2018   HDL 56.20 06/25/2018   LDLCALC 63 06/25/2018   TRIG 100.0 06/25/2018   CHOLHDL 2 06/25/2018     Last eye exam was 05/2018   Examination:   BP 136/70 (BP Location: Left Arm, Patient Position: Sitting, Cuff Size: Normal)   Pulse 83   Ht _0  (1.651 m)   Wt 197 lb 9.6 oz (89.6 kg)   SpO2 97%   BMI 32.88 kg/m   Body mass index is 32.88 kg/m.   Diabetic Foot Exam - Simple   Simple Foot Form Diabetic Foot exam was performed with the following findings: Yes   Visual Inspection No deformities, no ulcerations, no other skin breakdown bilaterally: Yes Sensation Testing Intact to touch and monofilament testing bilaterally: Yes Pulse Check Posterior Tibialis and Dorsalis pulse intact bilaterally: Yes Comments      ASSESSMENT/ PLAN:   Diabetes type 2 with  obesity  Her A1c is consistently good This is now 5.7 and improved slightly   She is on metformin 1000 mg a day and  15 mg Actos for several years without any adverse effects  Blood sugars at home are mostly near normal with only 1 relatively high reading of 137 before lunch Her weight has been better this year especially recently Again she is not able to do much walking but can encourage her to at least do some chair exercises  No signs of neuropathy on exam  Hypertension:  well controlled with bisoprolol/HCT 2.5 mg, verapamil  and losartan 50 mg No change recommended  Renal function and microalbumin normal.  Lipids: Will be checked by PCP in January  Influenza vaccine given  Follow-up in 6 months    Everardo Voris 04/21/2019, 2:03 PM

## 2019-04-25 ENCOUNTER — Other Ambulatory Visit: Payer: Self-pay | Admitting: Endocrinology

## 2019-05-04 ENCOUNTER — Other Ambulatory Visit: Payer: Self-pay

## 2019-05-04 DIAGNOSIS — Z20822 Contact with and (suspected) exposure to covid-19: Secondary | ICD-10-CM

## 2019-05-05 ENCOUNTER — Other Ambulatory Visit: Payer: Self-pay | Admitting: Gastroenterology

## 2019-05-06 LAB — NOVEL CORONAVIRUS, NAA: SARS-CoV-2, NAA: NOT DETECTED

## 2019-05-31 ENCOUNTER — Ambulatory Visit: Payer: Medicare Other | Admitting: Gastroenterology

## 2019-05-31 ENCOUNTER — Encounter: Payer: Self-pay | Admitting: Gastroenterology

## 2019-05-31 VITALS — BP 120/70 | HR 81 | Temp 98.8°F | Ht 65.0 in | Wt 195.0 lb

## 2019-05-31 DIAGNOSIS — K219 Gastro-esophageal reflux disease without esophagitis: Secondary | ICD-10-CM

## 2019-05-31 DIAGNOSIS — K580 Irritable bowel syndrome with diarrhea: Secondary | ICD-10-CM

## 2019-05-31 NOTE — Progress Notes (Signed)
    History of Present Illness: This is a 73 year old female with GERD and IBS.  Her reflux symptoms are under good control on Dexilant when she discontinues medication her symptoms return.  She has 2-4 loose bowel movements per day.  Symptoms can be worsened by certain foods including milk products, salads, beans.  Colonoscopy 02/2018 - Two 5 to 6 mm polyps in the sigmoid colon, removed with a cold snare. Resected and retrieved. Hyperplastic polyps. - Moderate diverticulosis in the left colon. There was no evidence of diverticular bleeding. - Internal hemorrhoids. - The examination was otherwise normal on direct and retroflexion views.  Current Medications, Allergies, Past Medical History, Past Surgical History, Family History and Social History were reviewed in [employer] record.   Physical Exam: General: Well developed, well nourished, no acute distress Head: Normocephalic and atraumatic Eyes:  sclerae anicteric, EOMI Ears: Normal auditory acuity Mouth: No deformity or lesions Lungs: Clear throughout to auscultation Heart: Regular rate and rhythm; no murmurs, rubs or bruits Abdomen: Soft, non tender and non distended. No masses, hepatosplenomegaly or hernias noted. Normal Bowel sounds Rectal: Not done Musculoskeletal: Symmetrical with no gross deformities  Pulses:  Normal pulses noted Extremities: No clubbing, cyanosis, edema or deformities noted Neurological: Alert oriented x 4, grossly nonfocal Psychological:  Alert and cooperative. Normal mood and affect   Assessment and Recommendations:  1. GERD.  Continue Dexilant 60 mg daily.  Follow standard antireflux measures.  REV in 1 year.   2. IBS-D.  Continue dicyclomine 20 mg p.o. before meals and at bedtime.  Imodium po tid prn.  Avoid foods that trigger symptoms.  Review low FODMAP diet to evaluate for additional dietary stressors.

## 2019-05-31 NOTE — Patient Instructions (Signed)
You have been given a low fodmap diet to follow.   You can take over the counter Imodium three times a day as needed for diarrhea symptoms.   Thank you for choosing me and [facility].  Pricilla Riffle. Dagoberto Ligas., MD., [facility] Regal

## 2019-06-09 DIAGNOSIS — H1045 Other chronic allergic conjunctivitis: Secondary | ICD-10-CM | POA: Diagnosis not present

## 2019-06-14 DIAGNOSIS — E119 Type 2 diabetes mellitus without complications: Secondary | ICD-10-CM | POA: Diagnosis not present

## 2019-06-24 ENCOUNTER — Telehealth: Payer: Self-pay | Admitting: Endocrinology

## 2019-06-24 ENCOUNTER — Other Ambulatory Visit: Payer: Self-pay

## 2019-06-24 MED ORDER — LOSARTAN POTASSIUM 50 MG PO TABS: 50.0000 mg | ORAL_TABLET | Freq: Every day | ORAL | 0 refills | Status: DC

## 2019-06-24 NOTE — Telephone Encounter (Signed)
Patient has been out of losartan for 5 days due to mail Pharm [facility] who states they sent the above medication on 06/14/19 but patient has not received it. Patient requests a new RX-just enough for patient to have until she receives her mail order (1 week's worth).   MEDICATION: losartan (COZAAR) 50 MG tablet  PHARMACY:   [facility] ([facility]), [place] - [address] Phone:  [phone]  Fax:  [phone]      IS THIS A 90 DAY SUPPLY : No-7 day supply  IS PATIENT OUT OF MEDICATION: Yes  IF NOT; HOW MUCH IS LEFT: 0  LAST APPOINTMENT DATE: @11 /02/2019  NEXT APPOINTMENT DATE:@4 /27/2021  DO WE HAVE YOUR PERMISSION TO LEAVE A DETAILED MESSAGE: Yes  OTHER COMMENTS:    **Let patient know to contact pharmacy at the end of the day to make sure medication is ready. **  ** Please notify patient to allow 48-72 hours to process**  **Encourage patient to contact the pharmacy for refills or they can request refills through [facility]**   losartan (COZAAR) 50 MG tablet

## 2019-06-24 NOTE — Telephone Encounter (Signed)
Rx sent 

## 2019-07-01 ENCOUNTER — Ambulatory Visit (INDEPENDENT_AMBULATORY_CARE_PROVIDER_SITE_OTHER): Payer: Medicare Other | Admitting: Internal Medicine

## 2019-07-01 ENCOUNTER — Encounter: Payer: Self-pay | Admitting: Internal Medicine

## 2019-07-01 ENCOUNTER — Other Ambulatory Visit: Payer: Self-pay

## 2019-07-01 VITALS — BP 138/88 | HR 84 | Temp 98.8°F | Ht 65.0 in | Wt 194.0 lb

## 2019-07-01 DIAGNOSIS — Z0000 Encounter for general adult medical examination without abnormal findings: Secondary | ICD-10-CM | POA: Diagnosis not present

## 2019-07-01 DIAGNOSIS — E119 Type 2 diabetes mellitus without complications: Secondary | ICD-10-CM

## 2019-07-01 DIAGNOSIS — E538 Deficiency of other specified B group vitamins: Secondary | ICD-10-CM

## 2019-07-01 DIAGNOSIS — E611 Iron deficiency: Secondary | ICD-10-CM | POA: Diagnosis not present

## 2019-07-01 DIAGNOSIS — E559 Vitamin D deficiency, unspecified: Secondary | ICD-10-CM

## 2019-07-01 DIAGNOSIS — I10 Essential (primary) hypertension: Secondary | ICD-10-CM | POA: Diagnosis not present

## 2019-07-01 DIAGNOSIS — E785 Hyperlipidemia, unspecified: Secondary | ICD-10-CM | POA: Diagnosis not present

## 2019-07-01 LAB — CBC WITH DIFFERENTIAL/PLATELET
Basophils Absolute: 0 10*3/uL (ref 0.0–0.1)
Basophils Relative: 0.8 % (ref 0.0–3.0)
Eosinophils Absolute: 0 10*3/uL (ref 0.0–0.7)
Eosinophils Relative: 0.8 % (ref 0.0–5.0)
HCT: 41.3 % (ref 36.0–46.0)
Hemoglobin: 13.6 g/dL (ref 12.0–15.0)
Lymphocytes Relative: 38.3 % (ref 12.0–46.0)
Lymphs Abs: 1.4 10*3/uL (ref 0.7–4.0)
MCHC: 32.9 g/dL (ref 30.0–36.0)
MCV: 86.4 fl (ref 78.0–100.0)
Monocytes Absolute: 0.3 10*3/uL (ref 0.1–1.0)
Monocytes Relative: 7.4 % (ref 3.0–12.0)
Neutro Abs: 2 10*3/uL (ref 1.4–7.7)
Neutrophils Relative %: 52.7 % (ref 43.0–77.0)
Platelets: 243 10*3/uL (ref 150.0–400.0)
RBC: 4.78 Mil/uL (ref 3.87–5.11)
RDW: 14.2 % (ref 11.5–15.5)
WBC: 3.7 10*3/uL — ABNORMAL LOW (ref 4.0–10.5)

## 2019-07-01 LAB — HEPATIC FUNCTION PANEL
ALT: 37 U/L — ABNORMAL HIGH (ref 0–35)
AST: 35 U/L (ref 0–37)
Albumin: 4.1 g/dL (ref 3.5–5.2)
Alkaline Phosphatase: 107 U/L (ref 39–117)
Bilirubin, Direct: 0 mg/dL (ref 0.0–0.3)
Total Bilirubin: 0.3 mg/dL (ref 0.2–1.2)
Total Protein: 7.8 g/dL (ref 6.0–8.3)

## 2019-07-01 LAB — URINALYSIS, ROUTINE W REFLEX MICROSCOPIC
Bilirubin Urine: NEGATIVE
Hgb urine dipstick: NEGATIVE
Ketones, ur: NEGATIVE
Leukocytes,Ua: NEGATIVE
Nitrite: NEGATIVE
RBC / HPF: NONE SEEN (ref 0–?)
Specific Gravity, Urine: 1.025 (ref 1.000–1.030)
Total Protein, Urine: NEGATIVE
Urine Glucose: NEGATIVE
Urobilinogen, UA: 0.2 (ref 0.0–1.0)
WBC, UA: NONE SEEN (ref 0–?)
pH: 5.5 (ref 5.0–8.0)

## 2019-07-01 LAB — HEMOGLOBIN A1C: Hgb A1c MFr Bld: 5.7 % (ref 4.6–6.5)

## 2019-07-01 LAB — IBC PANEL
Iron: 93 ug/dL (ref 42–145)
Saturation Ratios: 23.7 % (ref 20.0–50.0)
Transferrin: 280 mg/dL (ref 212.0–360.0)

## 2019-07-01 LAB — BASIC METABOLIC PANEL
BUN: 21 mg/dL (ref 6–23)
CO2: 23 mEq/L (ref 19–32)
Calcium: 9.8 mg/dL (ref 8.4–10.5)
Chloride: 105 mEq/L (ref 96–112)
Creatinine, Ser: 1.11 mg/dL (ref 0.40–1.20)
GFR: 58.07 mL/min — ABNORMAL LOW (ref >60.00)
Glucose, Bld: 84 mg/dL (ref 70–99)
Potassium: 3.8 mEq/L (ref 3.5–5.1)
Sodium: 138 mEq/L (ref 135–145)

## 2019-07-01 LAB — LIPID PANEL
Cholesterol: 135 mg/dL (ref 0–200)
HDL: 55 mg/dL (ref >39.00)
LDL Cholesterol: 59 mg/dL (ref 0–99)
NonHDL: 79.82
Total CHOL/HDL Ratio: 2
Triglycerides: 103 mg/dL (ref 0.0–149.0)
VLDL: 20.6 mg/dL (ref 0.0–40.0)

## 2019-07-01 LAB — MICROALBUMIN / CREATININE URINE RATIO
Creatinine,U: 160 mg/dL
Microalb Creat Ratio: 0.4 mg/g (ref 0.0–30.0)
Microalb, Ur: <0.7 mg/dL (ref 0.0–1.9)

## 2019-07-01 LAB — TSH: TSH: 1.29 u[IU]/mL (ref 0.35–4.50)

## 2019-07-01 LAB — VITAMIN B12: Vitamin B-12: 761 pg/mL (ref 211–911)

## 2019-07-01 LAB — VITAMIN D 25 HYDROXY (VIT D DEFICIENCY, FRACTURES): VITD: 80.27 ng/mL (ref 30.00–100.00)

## 2019-07-01 NOTE — Progress Notes (Signed)
Subjective:    Patient ID: Daisy Dillon, female    DOB: Jun 22, 1945, 74 y.o.   MRN: [id number]  HPI:  Here for wellness and f/u;  Overall doing ok;  Pt denies Chest pain, worsening SOB, DOE, wheezing, orthopnea, PND, worsening LE edema, palpitations, dizziness or syncope.  Pt denies neurological change such as new headache, facial or extremity weakness.  Pt denies polydipsia, polyuria, or low sugar symptoms. Pt states overall good compliance with treatment and medications, good tolerability, and has been trying to follow appropriate diet.  Pt denies worsening depressive symptoms, suicidal ideation or panic. No fever, night sweats, wt loss, loss of appetite, or other constitutional symptoms.  Pt states good ability with ADL's, has low fall risk, home safety reviewed and adequate, no other significant changes in hearing or vision, and only occasionally active with exercise..  No new complaints Past Medical History:  Diagnosis Date  . Anemia    long ago  . Arthritis   . Diabetes mellitus   . Diverticulosis of colon (without mention of hemorrhage)   . Elevated cholesterol   . Fibroid   . Gastritis   . GERD (gastroesophageal reflux disease)   . Hiatal hernia   . Hypertension   . IBS (irritable bowel syndrome)   . Positive ANA (antinuclear antibody) 04/23/2012  . Stricture and stenosis of esophagus 09/03/2005  . Urinary incontinence    Past Surgical History:  Procedure Laterality Date  . ABDOMINAL HYSTERECTOMY  1985  . BACK SURGERY  11/2016  . CHOLECYSTECTOMY    . COLONOSCOPY  12/15/2007   diverticulosis  . ESOPHAGOGASTRODUODENOSCOPY  12/24/2012   normal   . KNEE SURGERY Right    arthroscopic  . UPPER GASTROINTESTINAL ENDOSCOPY      reports that she has never smoked. She has never used smokeless tobacco. She reports that she does not drink alcohol or use drugs. family history includes Bipolar disorder in her brother; Breast cancer in her maternal aunt; Diabetes in her brother  and mother; Hypertension in her mother; Stroke in her brother and brother. Allergies  Allergen Reactions  . Codeine Phosphate     REACTION: unspecified, nausea, vomiting   Current Outpatient Medications on File Prior to Visit  Medication Sig Dispense Refill  . Alcohol Swabs (B-D SINGLE USE SWABS REGULAR) PADS Use as instructed. 100 each 1  . Ascorbic Acid (VITAMIN C) 1000 MG tablet Take 1,000 mg by mouth daily.    Marland Kitchen aspirin 81 MG tablet Take 81 mg by mouth daily.      . bisoprolol-hydrochlorothiazide (ZIAC) 2.5-6.25 MG tablet TAKE 1 TABLET BY MOUTH  DAILY 90 tablet 3  . Blood Glucose Monitoring Suppl ([address]) w/Device KIT Use as instructed to check blood sugar once daily. DX:E11.9 1 kit 0  . Cholecalciferol (VITAMIN D3) 1000 UNITS CAPS Take 1 capsule by mouth daily.      Marland Kitchen dexlansoprazole (DEXILANT) 60 MG capsule Take 1 capsule (60 mg total) by mouth daily. 90 capsule 3  . dicyclomine (BENTYL) 20 MG tablet TAKE 1 TABLET BY MOUTH 4  TIMES DAILY BEFORE MEALS  AND AT BEDTIME 360 tablet 0  . glucose blood test strip Use accu chek aviva plus test strip as instructed to check blood sugar once daily. DX:E11.9 100 each 1  . HYDROcodone-acetaminophen (NORCO/VICODIN) 5-325 MG tablet Take 1 tablet by mouth every 6 (six) hours as needed. 30 tablet 0  . losartan (COZAAR) 50 MG tablet Take 1 tablet (50 mg total) by mouth  daily. 7 tablet 0  . metFORMIN (GLUCOPHAGE) 500 MG tablet TAKE 1 TABLET BY MOUTH TWO  TIMES DAILY WITH MEALS 180 tablet 3  . Multiple Vitamin (MULTIVITAMIN) tablet Take 1 tablet by mouth daily.      . pioglitazone (ACTOS) 15 MG tablet TAKE 1 TABLET BY MOUTH  DAILY 90 tablet 3  . pravastatin (PRAVACHOL) 20 MG tablet TAKE 1 TABLET BY MOUTH  DAILY 90 tablet 3  . verapamil (CALAN-SR) 240 MG CR tablet TAKE 1 TABLET BY MOUTH AT  BEDTIME 90 tablet 3   No current facility-administered medications on file prior to visit.   Review of Systems  Constitutional: Negative for  other unusual diaphoresis or sweats HENT: Negative for ear discharge or swelling Eyes: Negative for other worsening visual disturbances Respiratory: Negative for stridor or other swelling  Gastrointestinal: Negative for worsening distension or other blood Genitourinary: Negative for retention or other urinary change Musculoskeletal: Negative for other MSK pain or swelling Skin: Negative for color change or other new lesions Neurological: Negative for worsening tremors and other numbness  Psychiatric/Behavioral: Negative for worsening agitation or other fatigue All otherwise neg per pt     Objective:   Physical Exam BP 138/88   Pulse 84   Temp 98.8 F (37.1 C) (Oral)   Ht '5\' 5"'$  (1.651 m)   Wt 194 lb (88 kg)   SpO2 96%   BMI 32.28 kg/m  VS noted,  Constitutional: Pt appears in NAD HENT: Head: NCAT.  Right Ear: External ear normal.  Left Ear: External ear normal.  Eyes: . Pupils are equal, round, and reactive to light. Conjunctivae and EOM are normal Nose: without d/c or deformity Neck: Neck supple. Gross normal ROM Cardiovascular: Normal rate and regular rhythm.   Pulmonary/Chest: Effort normal and breath sounds without rales or wheezing.  Abd:  Soft, NT, ND, + BS, no organomegaly Neurological: Pt is alert. At baseline orientation, motor grossly intact Skin: Skin is warm. No rashes, other new lesions, no LE edema Psychiatric: Pt behavior is normal without agitation  All otherwise neg per pt Lab Results  Component Value Date   WBC 3.7 (L) 07/01/2019   HGB 13.6 07/01/2019   HCT 41.3 07/01/2019   PLT 243.0 07/01/2019   GLUCOSE 84 07/01/2019   CHOL 135 07/01/2019   TRIG 103.0 07/01/2019   HDL 55.00 07/01/2019   LDLCALC 59 07/01/2019   ALT 37 (H) 07/01/2019   AST 35 07/01/2019   NA 138 07/01/2019   K 3.8 07/01/2019   CL 105 07/01/2019   CREATININE 1.11 07/01/2019   BUN 21 07/01/2019   CO2 23 07/01/2019   TSH 1.29 07/01/2019   INR 1.12 02/07/2018   HGBA1C 5.7  07/01/2019   MICROALBUR <0.7 07/01/2019      Assessment & Plan:

## 2019-07-01 NOTE — Patient Instructions (Signed)
Please call for your vaccine appt  Please continue all other medications as before, and refills have been done if requested.  Please have the pharmacy call with any other refills you may need.  Please continue your efforts at being more active, low cholesterol diet, and weight control.  You are otherwise up to date with prevention measures today.  Please keep your appointments with your specialists as you may have planned  Please go to the LAB at the blood drawing area for the tests to be done  You will be contacted by phone if any changes need to be made immediately.  Otherwise, you will receive a letter about your results with an explanation, but please check with MyChart first.  Please remember to sign up for MyChart if you have not done so, as this will be important to you in the future with finding out test results, communicating by private email, and scheduling acute appointments online when needed.  Please make an Appointment to return in 6 months, or sooner if needed, also with Lab Appointment for testing done 3-5 days before at the [facility] (so this is for TWO appointments - please see the scheduling desk as you leave)

## 2019-07-02 ENCOUNTER — Encounter: Payer: Self-pay | Admitting: Internal Medicine

## 2019-07-02 NOTE — Assessment & Plan Note (Signed)

## 2019-07-02 NOTE — Assessment & Plan Note (Signed)
stable overall by history and exam, recent data reviewed with pt, and pt to continue medical treatment as before,  to f/u any worsening symptoms or concerns  

## 2019-07-17 ENCOUNTER — Encounter: Payer: Self-pay | Admitting: Internal Medicine

## 2019-08-07 ENCOUNTER — Other Ambulatory Visit: Payer: Self-pay | Admitting: Endocrinology

## 2019-08-15 ENCOUNTER — Other Ambulatory Visit: Payer: Self-pay | Admitting: Gastroenterology

## 2019-09-08 ENCOUNTER — Ambulatory Visit: Payer: Medicare Other | Admitting: Obstetrics & Gynecology

## 2019-09-08 ENCOUNTER — Other Ambulatory Visit: Payer: Self-pay

## 2019-09-08 ENCOUNTER — Encounter: Payer: Self-pay | Admitting: Obstetrics & Gynecology

## 2019-09-08 VITALS — BP 138/78 | Ht 64.75 in | Wt 194.5 lb

## 2019-09-08 DIAGNOSIS — Z6832 Body mass index (BMI) 32.0-32.9, adult: Secondary | ICD-10-CM

## 2019-09-08 DIAGNOSIS — Z780 Asymptomatic menopausal state: Secondary | ICD-10-CM

## 2019-09-08 DIAGNOSIS — Z01419 Encounter for gynecological examination (general) (routine) without abnormal findings: Secondary | ICD-10-CM

## 2019-09-08 DIAGNOSIS — Z90710 Acquired absence of both cervix and uterus: Secondary | ICD-10-CM

## 2019-09-08 DIAGNOSIS — E6609 Other obesity due to excess calories: Secondary | ICD-10-CM

## 2019-09-08 NOTE — Progress Notes (Signed)
Tinisha Swaminathan Hillary [birth year] IR:[id number]   History:    74 y.o. G2P2L2  Has many grand-children  RP:  Established patient presenting for annual gyn exam   HPI: S/P Total Hysterectomy.  Postmenopausal, well on no hormone replacement therapy.  No pelvic pain.  Abstinent.  Breast normal.  Body mass index 32.62.  Limited physical activity.  Health labs with family physician.  Followed by endocrinology for diabetes mellitus type 2 on metformin.   Past medical history,surgical history, family history and social history were all reviewed and documented in the EPIC chart.  Gynecologic History No LMP recorded. Patient has had a hysterectomy.  Obstetric History OB History  Gravida Para Term Preterm AB Living  2 2 2     2   SAB TAB Ectopic Multiple Live Births               # Outcome Date GA Lbr Len/2nd Weight Sex Delivery Anes PTL Lv  2 Term           1 Term              ROS: A ROS was performed and pertinent positives and negatives are included in the history.  GENERAL: No fevers or chills. HEENT: No change in vision, no earache, sore throat or sinus congestion. NECK: No pain or stiffness. CARDIOVASCULAR: No chest pain or pressure. No palpitations. PULMONARY: No shortness of breath, cough or wheeze. GASTROINTESTINAL: No abdominal pain, nausea, vomiting or diarrhea, melena or bright red blood per rectum. GENITOURINARY: No urinary frequency, urgency, hesitancy or dysuria. MUSCULOSKELETAL: No joint or muscle pain, no back pain, no recent trauma. DERMATOLOGIC: No rash, no itching, no lesions. ENDOCRINE: No polyuria, polydipsia, no heat or cold intolerance. No recent change in weight. HEMATOLOGICAL: No anemia or easy bruising or bleeding. NEUROLOGIC: No headache, seizures, numbness, tingling or weakness. PSYCHIATRIC: No depression, no loss of interest in normal activity or change in sleep pattern.     Exam:   BP 138/78   Ht 5' 4.75" (1.645 m)   Wt 194 lb 8 oz (88.2 kg)   BMI 32.62  kg/m   Body mass index is 32.62 kg/m.  General appearance : Well developed well nourished female. No acute distress HEENT: Eyes: no retinal hemorrhage or exudates,  Neck supple, trachea midline, no carotid bruits, no thyroidmegaly Lungs: Clear to auscultation, no rhonchi or wheezes, or rib retractions  Heart: Regular rate and rhythm, no murmurs or gallops Breast:Examined in sitting and supine position were symmetrical in appearance, no palpable masses or tenderness,  no skin retraction, no nipple inversion, no nipple discharge, no skin discoloration, no axillary or supraclavicular lymphadenopathy Abdomen: no palpable masses or tenderness, no rebound or guarding Extremities: no edema or skin discoloration or tenderness  Pelvic: Vulva: Normal             Vagina: No gross lesions or discharge  Cervix/Uterus absent  Adnexa  Without masses or tenderness  Anus: Normal   Assessment/Plan:  74 y.o. female for annual exam   1. Well female exam with routine gynecological exam Gynecologic exam in menopause status post total hysterectomy.  No indication to repeat the Pap test this year.  Breast exam normal.  Screening mammogram October 2020 was negative.  Colonoscopy in 2019.  Health labs with family physician.  2. H/O total hysterectomy  3. Postmenopausal Well on no hormone replacement therapy.  No postmenopausal bleeding.  Bone density in December 2016 was completely normal.  Will organize a repeat bone  density next year.  Vitamin D supplements, calcium intake of 1200 mg daily and regular weightbearing physical activities recommended.  4. Class 1 obesity due to excess calories with serious comorbidity and body mass index (BMI) of 32.0 to 32.9 in adult Recommend a lower calorie/carb diet such as [employer].  Aerobic activities 5 times a week and light weightlifting every 2 days.  Princess Bruins MD, 3:22 PM 09/08/2019

## 2019-09-09 ENCOUNTER — Encounter: Payer: Self-pay | Admitting: Obstetrics & Gynecology

## 2019-09-09 NOTE — Patient Instructions (Signed)
1. Well female exam with routine gynecological exam Gynecologic exam in menopause status post total hysterectomy.  No indication to repeat the Pap test this year.  Breast exam normal.  Screening mammogram October 2020 was negative.  Colonoscopy in 2019.  Health labs with family physician.  2. H/O total hysterectomy  3. Postmenopausal Well on no hormone replacement therapy.  No postmenopausal bleeding.  Bone density in December 2016 was completely normal.  Will organize a repeat bone density next year.  Vitamin D supplements, calcium intake of 1200 mg daily and regular weightbearing physical activities recommended.  4. Class 1 obesity due to excess calories with serious comorbidity and body mass index (BMI) of 32.0 to 32.9 in adult Recommend a lower calorie/carb diet such as [employer].  Aerobic activities 5 times a week and light weightlifting every 2 days.  Kassi, it was a pleasure seeing you today!

## 2019-09-19 ENCOUNTER — Telehealth: Payer: Self-pay

## 2019-09-19 NOTE — Telephone Encounter (Signed)
[facility] faxed several times requesting info and Dr. Jenny Reichmann is not seeing this patient for requested info.   Please do not send any info to them.

## 2019-09-22 ENCOUNTER — Telehealth: Payer: Self-pay | Admitting: Gastroenterology

## 2019-09-22 NOTE — Telephone Encounter (Signed)
Informed patient to drop off the forms for patient assistance with me at the office and I will fax them to [facility].

## 2019-09-27 NOTE — Telephone Encounter (Signed)
Received fax from [facility] stating that patient is enrolled in the patient assistance program and will receive her first order of medication within a few days. She will receive it until 06/15/20.

## 2019-10-11 ENCOUNTER — Other Ambulatory Visit (INDEPENDENT_AMBULATORY_CARE_PROVIDER_SITE_OTHER): Payer: Medicare Other

## 2019-10-11 ENCOUNTER — Other Ambulatory Visit: Payer: Self-pay

## 2019-10-11 DIAGNOSIS — E7800 Pure hypercholesterolemia, unspecified: Secondary | ICD-10-CM | POA: Diagnosis not present

## 2019-10-11 DIAGNOSIS — E1169 Type 2 diabetes mellitus with other specified complication: Secondary | ICD-10-CM

## 2019-10-11 DIAGNOSIS — E669 Obesity, unspecified: Secondary | ICD-10-CM

## 2019-10-11 LAB — HEMOGLOBIN A1C: Hgb A1c MFr Bld: 5.8 % (ref 4.6–6.5)

## 2019-10-11 LAB — LIPID PANEL
Cholesterol: 142 mg/dL (ref 0–200)
HDL: 58.3 mg/dL (ref >39.00)
LDL Cholesterol: 69 mg/dL (ref 0–99)
NonHDL: 84.18
Total CHOL/HDL Ratio: 2
Triglycerides: 77 mg/dL (ref 0.0–149.0)
VLDL: 15.4 mg/dL (ref 0.0–40.0)

## 2019-10-11 LAB — COMPREHENSIVE METABOLIC PANEL
ALT: 33 U/L (ref 0–35)
AST: 27 U/L (ref 0–37)
Albumin: 4.1 g/dL (ref 3.5–5.2)
Alkaline Phosphatase: 109 U/L (ref 39–117)
BUN: 17 mg/dL (ref 6–23)
CO2: 29 mEq/L (ref 19–32)
Calcium: 9.5 mg/dL (ref 8.4–10.5)
Chloride: 104 mEq/L (ref 96–112)
Creatinine, Ser: 1.14 mg/dL (ref 0.40–1.20)
GFR: 56.27 mL/min — ABNORMAL LOW (ref >60.00)
Glucose, Bld: 101 mg/dL — ABNORMAL HIGH (ref 70–99)
Potassium: 3.7 mEq/L (ref 3.5–5.1)
Sodium: 140 mEq/L (ref 135–145)
Total Bilirubin: 0.3 mg/dL (ref 0.2–1.2)
Total Protein: 7.8 g/dL (ref 6.0–8.3)

## 2019-10-19 ENCOUNTER — Other Ambulatory Visit: Payer: Self-pay

## 2019-10-20 ENCOUNTER — Encounter: Payer: Self-pay | Admitting: Endocrinology

## 2019-10-20 ENCOUNTER — Ambulatory Visit: Payer: Medicare Other | Admitting: Endocrinology

## 2019-10-20 VITALS — BP 122/70 | HR 87 | Ht 64.75 in | Wt 194.4 lb

## 2019-10-20 DIAGNOSIS — E7800 Pure hypercholesterolemia, unspecified: Secondary | ICD-10-CM | POA: Diagnosis not present

## 2019-10-20 DIAGNOSIS — E1169 Type 2 diabetes mellitus with other specified complication: Secondary | ICD-10-CM | POA: Diagnosis not present

## 2019-10-20 DIAGNOSIS — Z6833 Body mass index (BMI) 33.0-33.9, adult: Secondary | ICD-10-CM

## 2019-10-20 DIAGNOSIS — I10 Essential (primary) hypertension: Secondary | ICD-10-CM | POA: Diagnosis not present

## 2019-10-20 DIAGNOSIS — E669 Obesity, unspecified: Secondary | ICD-10-CM

## 2019-10-20 NOTE — Progress Notes (Signed)
Patient ID: Daisy Dillon, female   DOB: 06-01-1946, 74 y.o.   MRN: [id number]   Reason for Appointment: follow-up   History of Present Illness   Diagnosis: Type 2 DIABETES MELITUS, date of diagnosis:   2002   She has had stable long-term control of her mild diabetes with combination of low-doses of Actos and metformin  She usually has had a normal A1c and this stays in the 5.7- 6% range  A1c is consistently below 6% and now 5.8  Current management, blood sugar patterns and history:   She is able to keep her weight consistent although I am not able to lose any  Usually trying to watch portions and high-fat foods  She does have an unusual reading of 191 after lunch last night but she does not remember what made it go up  Otherwise blood sugars are fairly consistently near normal range; as before checking readings before and after various meals by rotation  Lab glucose 101 fasting  No edema with Actos  Has not been forgetting to take any of her medications also  As before because of her back pain cannot do much walking  Side effects from medications: None  Monitors blood glucose: Less than once a day .    Glucometer: One touch     Blood Glucose readings and averages from download:   PRE-MEAL Fasting Lunch Dinner Bedtime Overall  Glucose range:  88, 101   91-106  96-108   Mean/median:      105   POST-MEAL PC Breakfast PC Lunch PC Dinner  Glucose range:  71-122  145, 191   Mean/median:        PREVIOUS sugar range 78-137  AVERAGE 97   Wt Readings from Last 3 Encounters:  10/20/19 194 lb 6.4 oz (88.2 kg)  09/08/19 194 lb 8 oz (88.2 kg)  07/01/19 194 lb (88 kg)    Lab Results  Component Value Date   HGBA1C 5.8 10/11/2019   HGBA1C 5.7 07/01/2019   HGBA1C 5.7 04/18/2019   Lab Results  Component Value Date   MICROALBUR <0.7 07/01/2019   LDLCALC 69 10/11/2019   CREATININE 1.14 10/11/2019    OTHER active problems: See review of  systems   No visits with results within 1 Week(s) from this visit.  Latest known visit with results is:  Lab on 10/11/2019  Component Date Value Ref Range Status  . Cholesterol 10/11/2019 142  0 - 200 mg/dL Final   ATP III Classification       Desirable:  < 200 mg/dL               Borderline High:  200 - 239 mg/dL          High:  > = 240 mg/dL  . Triglycerides 10/11/2019 77.0  0.0 - 149.0 mg/dL Final   Normal:  <150 mg/dLBorderline High:  150 - 199 mg/dL  . HDL 10/11/2019 58.30  >39.00 mg/dL Final  . VLDL 10/11/2019 15.4  0.0 - 40.0 mg/dL Final  . LDL Cholesterol 10/11/2019 69  0 - 99 mg/dL Final  . Total CHOL/HDL Ratio 10/11/2019 2   Final                  Men          Women1/2 Average Risk     3.4          3.3Average Risk          5.0  4.42X Average Risk          9.6          7.13X Average Risk          15.0          11.0                      . NonHDL 10/11/2019 84.18   Final   NOTE:  Non-HDL goal should be 30 mg/dL higher than patient's LDL goal (i.e. LDL goal of < 70 mg/dL, would have non-HDL goal of < 100 mg/dL)  . Sodium 10/11/2019 140  135 - 145 mEq/L Final  . Potassium 10/11/2019 3.7  3.5 - 5.1 mEq/L Final  . Chloride 10/11/2019 104  96 - 112 mEq/L Final  . CO2 10/11/2019 29  19 - 32 mEq/L Final  . Glucose, Bld 10/11/2019 101* 70 - 99 mg/dL Final  . BUN 10/11/2019 17  6 - 23 mg/dL Final  . Creatinine, Ser 10/11/2019 1.14  0.40 - 1.20 mg/dL Final  . Total Bilirubin 10/11/2019 0.3  0.2 - 1.2 mg/dL Final  . Alkaline Phosphatase 10/11/2019 109  39 - 117 U/L Final  . AST 10/11/2019 27  0 - 37 U/L Final  . ALT 10/11/2019 33  0 - 35 U/L Final  . Total Protein 10/11/2019 7.8  6.0 - 8.3 g/dL Final  . Albumin 10/11/2019 4.1  3.5 - 5.2 g/dL Final  . GFR 10/11/2019 56.27* >60.00 mL/min Final  . Calcium 10/11/2019 9.5  8.4 - 10.5 mg/dL Final  . Hgb A1c MFr Bld 10/11/2019 5.8  4.6 - 6.5 % Final   Glycemic Control Guidelines for People with Diabetes:Non Diabetic:  <6%Goal of  Therapy: <7%Additional Action Suggested:  >8%     Allergies as of 10/20/2019      Reactions   Codeine Phosphate    REACTION: unspecified, nausea, vomiting      Medication List       Accurate as of Oct 20, 2019 10:18 AM. If you have any questions, ask your nurse or doctor.        aspirin 81 MG tablet Take 81 mg by mouth daily.   B-D SINGLE USE SWABS REGULAR Pads Use as instructed.   bisoprolol-hydrochlorothiazide 2.5-6.25 MG tablet Commonly known as: ZIAC TAKE 1 TABLET BY MOUTH  DAILY   dexlansoprazole 60 MG capsule Commonly known as: Dexilant Take 1 capsule (60 mg total) by mouth daily.   dicyclomine 20 MG tablet Commonly known as: BENTYL TAKE 1 TABLET BY MOUTH 4  TIMES DAILY BEFORE MEALS  AND AT BEDTIME   HYDROcodone-acetaminophen 5-325 MG tablet Commonly known as: NORCO/VICODIN Take 1 tablet by mouth every 6 (six) hours as needed.   losartan 50 MG tablet Commonly known as: COZAAR Take 1 tablet (50 mg total) by mouth daily.   metFORMIN 500 MG tablet Commonly known as: GLUCOPHAGE TAKE 1 TABLET BY MOUTH TWO  TIMES DAILY WITH MEALS   multivitamin tablet Take 1 tablet by mouth daily.   OneTouch Verio Flex System w/Device Kit Use as instructed to check blood sugar once daily. DX:E11.9   OneTouch Verio test strip Generic drug: glucose blood USE  STRIP TO CHECK GLUCOSE ONCE DAILY   pioglitazone 15 MG tablet Commonly known as: ACTOS TAKE 1 TABLET BY MOUTH  DAILY   pravastatin 20 MG tablet Commonly known as: PRAVACHOL TAKE 1 TABLET BY MOUTH  DAILY   verapamil 240 MG CR tablet Commonly known as: CALAN-SR  TAKE 1 TABLET BY MOUTH AT  BEDTIME   vitamin C 1000 MG tablet Take 1,000 mg by mouth daily.   Vitamin D3 25 MCG (1000 UT) Caps Take 1 capsule by mouth daily.       Allergies:  Allergies  Allergen Reactions  . Codeine Phosphate     REACTION: unspecified, nausea, vomiting    Past Medical History:  Diagnosis Date  . Anemia    long ago  .  Arthritis   . Diabetes mellitus   . Diverticulosis of colon (without mention of hemorrhage)   . Elevated cholesterol   . Fibroid   . Gastritis   . GERD (gastroesophageal reflux disease)   . Hiatal hernia   . Hypertension   . IBS (irritable bowel syndrome)   . Positive ANA (antinuclear antibody) 04/23/2012  . Stricture and stenosis of esophagus 09/03/2005  . Urinary incontinence     Past Surgical History:  Procedure Laterality Date  . ABDOMINAL HYSTERECTOMY  1985  . BACK SURGERY  11/2016  . CHOLECYSTECTOMY    . COLONOSCOPY  12/15/2007   diverticulosis  . ESOPHAGOGASTRODUODENOSCOPY  12/24/2012   normal   . KNEE SURGERY Right    arthroscopic  . UPPER GASTROINTESTINAL ENDOSCOPY      Family History  Problem Relation Age of Onset  . Diabetes Mother   . Hypertension Mother   . Stroke Brother        x 2  . Bipolar disorder Brother   . Stroke Brother   . Diabetes Brother   . Breast cancer Maternal Aunt        Age 68's  . Colon cancer Neg Hx   . Stomach cancer Neg Hx   . Throat cancer Neg Hx   . Liver disease Neg Hx   . Colon polyps Neg Hx   . Esophageal cancer Neg Hx   . Rectal cancer Neg Hx     Social History:  reports that she has never smoked. She has never used smokeless tobacco. She reports that she does not drink alcohol or use drugs.  Review of Systems:  HYPERTENSION:   She takes losartan 50 mg, Verapamil 240 mg and Ziac 2.5 Blood pressure is consistently controlled Her medication regimen has been continued unchanged for some time Home BP checked periodically  The blood pressure at home is recently 138/74, using an arm cuff  BP Readings from Last 3 Encounters:  10/20/19 122/70  09/08/19 138/78  07/01/19 138/88   BMP results:  Lab Results  Component Value Date   CREATININE 1.14 10/11/2019   BUN 17 10/11/2019   NA 140 10/11/2019   K 3.7 10/11/2019   CL 104 10/11/2019   CO2 29 10/11/2019    HYPERLIPIDEMIA: She has had hypercholesterolemia, LDL  is well-controlled on pravastatin 20 mg Also followed by PCP  Lab Results  Component Value Date   CHOL 142 10/11/2019   HDL 58.30 10/11/2019   LDLCALC 69 10/11/2019   TRIG 77.0 10/11/2019   CHOLHDL 2 10/11/2019     Last eye exam was 05/2019  Last foot exam 04/2019  She has had her Covid vaccine   Examination:   BP 122/70 (BP Location: Left Arm, Patient Position: Sitting, Cuff Size: Large)   Pulse 87   Ht 5' 4.75" (1.645 m)   Wt 194 lb 6.4 oz (88.2 kg)   SpO2 97%   BMI 32.60 kg/m   Body mass index is 32.6 kg/m.   Trace right ankle edema  ASSESSMENT/ PLAN:  Diabetes type 2 with  obesity  Her A1c is consistently below 6% and now 5.8  Her A1c has not changed for last few years  She is on metformin 1000 mg a day and  15 mg Actos for several years Tolerating this well Her right ankle edema is dependent and only in the evenings and not related to Actos  No recent weight gain Blood sugars at home are really consistently normal except for one unusual reading of 191 She will try to keep a track of what foods or drinks make her sugar go up To call if she has any persistently high readings  Has had no diabetes complications, duration of diabetes almost 20 years  Hypertension:  well controlled with bisoprolol/HCT 2.5 mg, verapamil and losartan 50 mg She will continue the same regimen  Renal function and microalbumin normal.  Lipids: Well-controlled  Follow-up in 12 months    Navneet Schmuck 10/20/2019, 10:18 AM

## 2019-11-21 ENCOUNTER — Other Ambulatory Visit: Payer: Self-pay | Admitting: Gastroenterology

## 2019-12-01 NOTE — Telephone Encounter (Signed)
Patient states she has called [facility] regarding her patient assistance and they state they are shipping it out soon. Patient was unsure why it took so long. Informed patient I will leave some samples of Dexilant at the front desk for her. Patient verbalized understanding.

## 2019-12-01 NOTE — Telephone Encounter (Signed)
Patient is calling to see if you have any sample to give for the mail order has not been recieved

## 2019-12-24 ENCOUNTER — Other Ambulatory Visit: Payer: Self-pay | Admitting: Endocrinology

## 2019-12-26 ENCOUNTER — Other Ambulatory Visit: Payer: Self-pay

## 2019-12-26 ENCOUNTER — Other Ambulatory Visit (INDEPENDENT_AMBULATORY_CARE_PROVIDER_SITE_OTHER): Payer: Medicare Other

## 2019-12-26 DIAGNOSIS — E611 Iron deficiency: Secondary | ICD-10-CM

## 2019-12-26 DIAGNOSIS — E538 Deficiency of other specified B group vitamins: Secondary | ICD-10-CM | POA: Diagnosis not present

## 2019-12-26 DIAGNOSIS — E559 Vitamin D deficiency, unspecified: Secondary | ICD-10-CM

## 2019-12-26 DIAGNOSIS — E119 Type 2 diabetes mellitus without complications: Secondary | ICD-10-CM | POA: Diagnosis not present

## 2019-12-26 LAB — VITAMIN B12: Vitamin B-12: 684 pg/mL (ref 211–911)

## 2019-12-26 LAB — HEPATIC FUNCTION PANEL
ALT: 36 U/L — ABNORMAL HIGH (ref 0–35)
AST: 29 U/L (ref 0–37)
Albumin: 4.1 g/dL (ref 3.5–5.2)
Alkaline Phosphatase: 107 U/L (ref 39–117)
Bilirubin, Direct: 0 mg/dL (ref 0.0–0.3)
Total Bilirubin: 0.3 mg/dL (ref 0.2–1.2)
Total Protein: 7.6 g/dL (ref 6.0–8.3)

## 2019-12-26 LAB — BASIC METABOLIC PANEL
BUN: 15 mg/dL (ref 6–23)
CO2: 25 mEq/L (ref 19–32)
Calcium: 9.7 mg/dL (ref 8.4–10.5)
Chloride: 106 mEq/L (ref 96–112)
Creatinine, Ser: 1.03 mg/dL (ref 0.40–1.20)
GFR: 63.22 mL/min (ref >60.00)
Glucose, Bld: 100 mg/dL — ABNORMAL HIGH (ref 70–99)
Potassium: 3.4 mEq/L — ABNORMAL LOW (ref 3.5–5.1)
Sodium: 141 mEq/L (ref 135–145)

## 2019-12-26 LAB — HEMOGLOBIN A1C: Hgb A1c MFr Bld: 5.7 % (ref 4.6–6.5)

## 2019-12-26 LAB — LIPID PANEL
Cholesterol: 139 mg/dL (ref 0–200)
HDL: 56.5 mg/dL (ref >39.00)
LDL Cholesterol: 65 mg/dL (ref 0–99)
NonHDL: 82.82
Total CHOL/HDL Ratio: 2
Triglycerides: 89 mg/dL (ref 0.0–149.0)
VLDL: 17.8 mg/dL (ref 0.0–40.0)

## 2019-12-26 LAB — VITAMIN D 25 HYDROXY (VIT D DEFICIENCY, FRACTURES): VITD: 78.36 ng/mL (ref 30.00–100.00)

## 2019-12-26 LAB — IBC PANEL
Iron: 50 ug/dL (ref 42–145)
Saturation Ratios: 10.9 % — ABNORMAL LOW (ref 20.0–50.0)
Transferrin: 328 mg/dL (ref 212.0–360.0)

## 2019-12-29 ENCOUNTER — Ambulatory Visit (INDEPENDENT_AMBULATORY_CARE_PROVIDER_SITE_OTHER): Payer: Medicare Other | Admitting: Internal Medicine

## 2019-12-29 ENCOUNTER — Other Ambulatory Visit: Payer: Self-pay

## 2019-12-29 ENCOUNTER — Encounter: Payer: Self-pay | Admitting: Internal Medicine

## 2019-12-29 VITALS — BP 160/78 | HR 78 | Temp 99.2°F | Ht 64.0 in | Wt 197.0 lb

## 2019-12-29 DIAGNOSIS — E785 Hyperlipidemia, unspecified: Secondary | ICD-10-CM | POA: Diagnosis not present

## 2019-12-29 DIAGNOSIS — E119 Type 2 diabetes mellitus without complications: Secondary | ICD-10-CM

## 2019-12-29 DIAGNOSIS — M545 Low back pain, unspecified: Secondary | ICD-10-CM

## 2019-12-29 DIAGNOSIS — I10 Essential (primary) hypertension: Secondary | ICD-10-CM

## 2019-12-29 DIAGNOSIS — Z0000 Encounter for general adult medical examination without abnormal findings: Secondary | ICD-10-CM

## 2019-12-29 DIAGNOSIS — J309 Allergic rhinitis, unspecified: Secondary | ICD-10-CM | POA: Diagnosis not present

## 2019-12-29 DIAGNOSIS — E876 Hypokalemia: Secondary | ICD-10-CM

## 2019-12-29 MED ORDER — PREDNISONE 10 MG PO TABS
ORAL_TABLET | ORAL | 0 refills | Status: DC
Start: 2019-12-29 — End: 2020-03-26

## 2019-12-29 MED ORDER — POTASSIUM CHLORIDE CRYS ER 10 MEQ PO TBCR: 10.0000 meq | EXTENDED_RELEASE_TABLET | Freq: Every day | ORAL | 0 refills | Status: DC

## 2019-12-29 MED ORDER — FEXOFENADINE HCL 180 MG PO TABS: 180.0000 mg | ORAL_TABLET | Freq: Every day | ORAL | 2 refills | Status: AC

## 2019-12-29 MED ORDER — TRIAMCINOLONE ACETONIDE 55 MCG/ACT NA AERO
2.0000 | INHALATION_SPRAY | Freq: Every day | NASAL | 12 refills | Status: DC
Start: 2019-12-29 — End: 2020-03-26

## 2019-12-29 NOTE — Patient Instructions (Signed)
Please take all new medication as prescribed - the 3 days of the potassium prescription  Please take all new medication as prescribed - the prednisone, allegra and nasacort for allergies  Please continue all other medications as before, and refills have been done if requested.  Please have the pharmacy call with any other refills you may need.  Please continue your efforts at being more active, low cholesterol diet, and weight control.  Please keep your appointments with your specialists as you may have planned  Please make an Appointment to return in 6 months, or sooner if needed, also with Lab Appointment for testing done 3-5 days before at the [facility] (so this is for TWO appointments - please see the scheduling desk as you leave)

## 2019-12-29 NOTE — Progress Notes (Signed)
Subjective:    Patient ID: Daisy Dillon, female    DOB: 1945/10/28, 74 y.o.   MRN: [id number]  HPI  Here to f/u; overall doing ok,  Pt denies chest pain, increasing sob or doe, wheezing, orthopnea, PND, increased LE swelling, palpitations, dizziness or syncope.  Pt denies new neurological symptoms such as new headache, or facial or extremity weakness or numbness.  Pt denies polydipsia, polyuria, or low sugar episode.  Pt states overall good compliance with meds, mostly trying to follow appropriate diet, with wt overall stable,  but little exercise however.BP < 140/90 at home per pt.  Does have several wks ongoing nasal allergy symptoms with clearish congestion, itch and sneezing, without fever, pain, ST, cough, swelling or wheezing.  Has recent mild low k.  Pt continues to have recurring LBP without change in severity, bowel or bladder change, fever, wt loss,  worsening LE pain/numbness/weakness, gait change or falls except for mild intermittent LLE pain to the buttocks and post upper thigh. Past Medical History:  Diagnosis Date  . Anemia    long ago  . Arthritis   . Diabetes mellitus   . Diverticulosis of colon (without mention of hemorrhage)   . Elevated cholesterol   . Fibroid   . Gastritis   . GERD (gastroesophageal reflux disease)   . Hiatal hernia   . Hypertension   . IBS (irritable bowel syndrome)   . Positive ANA (antinuclear antibody) 04/23/2012  . Stricture and stenosis of esophagus 09/03/2005  . Urinary incontinence    Past Surgical History:  Procedure Laterality Date  . ABDOMINAL HYSTERECTOMY  1985  . BACK SURGERY  11/2016  . CHOLECYSTECTOMY    . COLONOSCOPY  12/15/2007   diverticulosis  . ESOPHAGOGASTRODUODENOSCOPY  12/24/2012   normal   . KNEE SURGERY Right    arthroscopic  . UPPER GASTROINTESTINAL ENDOSCOPY      reports that she has never smoked. She has never used smokeless tobacco. She reports that she does not drink alcohol and does not use  drugs. family history includes Bipolar disorder in her brother; Breast cancer in her maternal aunt; Diabetes in her brother and mother; Hypertension in her mother; Stroke in her brother and brother. Allergies  Allergen Reactions  . Codeine Phosphate     REACTION: unspecified, nausea, vomiting   Current Outpatient Medications on File Prior to Visit  Medication Sig Dispense Refill  . Alcohol Swabs (B-D SINGLE USE SWABS REGULAR) PADS Use as instructed. 100 each 1  . Ascorbic Acid (VITAMIN C) 1000 MG tablet Take 1,000 mg by mouth daily.    Marland Kitchen aspirin 81 MG tablet Take 81 mg by mouth daily.      . bisoprolol-hydrochlorothiazide (ZIAC) 2.5-6.25 MG tablet TAKE 1 TABLET BY MOUTH  DAILY 90 tablet 3  . Blood Glucose Monitoring Suppl ([place]) w/Device KIT Use as instructed to check blood sugar once daily. DX:E11.9 1 kit 0  . Cholecalciferol (VITAMIN D3) 1000 UNITS CAPS Take 1 capsule by mouth daily.      Marland Kitchen dexlansoprazole (DEXILANT) 60 MG capsule Take 1 capsule (60 mg total) by mouth daily. 90 capsule 3  . dicyclomine (BENTYL) 20 MG tablet TAKE 1 TABLET BY MOUTH 4  TIMES DAILY BEFORE MEALS  AND AT BEDTIME 360 tablet 1  . HYDROcodone-acetaminophen (NORCO/VICODIN) 5-325 MG tablet Take 1 tablet by mouth every 6 (six) hours as needed. 30 tablet 0  . losartan (COZAAR) 50 MG tablet Take 1 tablet (50 mg total) by mouth  daily. 7 tablet 0  . metFORMIN (GLUCOPHAGE) 500 MG tablet TAKE 1 TABLET BY MOUTH TWO  TIMES DAILY WITH MEALS 180 tablet 3  . Multiple Vitamin (MULTIVITAMIN) tablet Take 1 tablet by mouth daily.      Glory Rosebush VERIO test strip USE  STRIP TO CHECK GLUCOSE ONCE DAILY 50 each 3  . pioglitazone (ACTOS) 15 MG tablet TAKE 1 TABLET BY MOUTH  DAILY 90 tablet 3  . pravastatin (PRAVACHOL) 20 MG tablet TAKE 1 TABLET BY MOUTH  DAILY 90 tablet 3  . verapamil (CALAN-SR) 240 MG CR tablet TAKE 1 TABLET BY MOUTH AT  BEDTIME 90 tablet 3   No current facility-administered medications on file  prior to visit.   Review of Systems All otherwise neg per pt     Objective:   Physical Exam BP (!) 160/78 (BP Location: Left Arm, Patient Position: Sitting, Cuff Size: Large)   Pulse 78   Temp 99.2 F (37.3 C) (Oral)   Ht 5' 4" (1.626 m)   Wt 197 lb (89.4 kg)   SpO2 97%   BMI 33.81 kg/m  VS noted,  Constitutional: Pt appears in NAD HENT: Head: NCAT.  Right Ear: External ear normal.  Left Ear: External ear normal.  Bilat tm's with mild erythema.  Max sinus areas none tender.  Pharynx with mild erythema, no exudate Eyes: . Pupils are equal, round, and reactive to light. Conjunctivae and EOM are normal Nose: without d/c or deformity Neck: Neck supple. Gross normal ROM Cardiovascular: Normal rate and regular rhythm.   Pulmonary/Chest: Effort normal and breath sounds without rales or wheezing.  Abd:  Soft, NT, ND, + BS, no organomegaly Neurological: Pt is alert. At baseline orientation, motor grossly intact Skin: Skin is warm. No rashes, other new lesions, no LE edema Psychiatric: Pt behavior is normal without agitation  All otherwise neg per pt Lab Results  Component Value Date   WBC 3.7 (L) 07/01/2019   HGB 13.6 07/01/2019   HCT 41.3 07/01/2019   PLT 243.0 07/01/2019   GLUCOSE 100 (H) 12/26/2019   CHOL 139 12/26/2019   TRIG 89.0 12/26/2019   HDL 56.50 12/26/2019   LDLCALC 65 12/26/2019   ALT 36 (H) 12/26/2019   AST 29 12/26/2019   NA 141 12/26/2019   K 3.4 (L) 12/26/2019   CL 106 12/26/2019   CREATININE 1.03 12/26/2019   BUN 15 12/26/2019   CO2 25 12/26/2019   TSH 1.29 07/01/2019   INR 1.12 02/07/2018   HGBA1C 5.7 12/26/2019   MICROALBUR <0.7 07/01/2019         Assessment & Plan:

## 2019-12-31 ENCOUNTER — Encounter: Payer: Self-pay | Admitting: Internal Medicine

## 2019-12-31 DIAGNOSIS — E876 Hypokalemia: Secondary | ICD-10-CM | POA: Insufficient documentation

## 2019-12-31 NOTE — Assessment & Plan Note (Signed)
Mild for kdur 10 qd for 3 days

## 2019-12-31 NOTE — Assessment & Plan Note (Addendum)
stable overall by history and exam, recent data reviewed with pt, and pt to continue medical treatment as before,  to f/u any worsening symptoms or concerns  I spent 41 minutes in preparing to see the patient by review of recent labs, imaging and procedures, obtaining and reviewing separately obtained history, communicating with the patient and family or caregiver, ordering medications, tests or procedures, and documenting clinical information in the EHR including the differential Dx, treatment, and any further evaluation and other management of allergies, dm, hld, htn, hypok, lbp

## 2019-12-31 NOTE — Assessment & Plan Note (Signed)
/  Mild to mod, for predpac asd,  to f/u any worsening symptoms or concerns 

## 2019-12-31 NOTE — Assessment & Plan Note (Signed)
With left lumbar radiculitis - mild, declines mRI f/u, to consider dr Patrice Paradise f/u as well

## 2019-12-31 NOTE — Assessment & Plan Note (Signed)
stable overall by history and exam, recent data reviewed with pt, and pt to continue medical treatment as before,  to f/u any worsening symptoms or concerns  

## 2020-01-29 ENCOUNTER — Other Ambulatory Visit: Payer: Self-pay | Admitting: Endocrinology

## 2020-02-14 ENCOUNTER — Telehealth: Payer: Self-pay | Admitting: Internal Medicine

## 2020-02-14 DIAGNOSIS — R059 Cough, unspecified: Secondary | ICD-10-CM

## 2020-02-14 NOTE — Telephone Encounter (Signed)
New message:   Pt states when she was here back in July and states the Dr gave her something for her allergies and a cough. She states she is still coughing. She states it's not everyday but when she starts it won't stop for a while. She would like to know if there are any other recommendations. Please advise.

## 2020-02-17 NOTE — Telephone Encounter (Signed)
Sent to Dr. John. 

## 2020-02-17 NOTE — Telephone Encounter (Signed)
Continue all meds including the allergy meds, which by the way dont work so well on the cough if not taken every day  [facility] for cxr - ordered  Consider pulm referral

## 2020-02-21 NOTE — Telephone Encounter (Signed)
Spoke with pt informed her of Dr. Gwynn Burly advise. Pt stated she understood and will be going to get her chest x-ray this week.

## 2020-02-26 ENCOUNTER — Other Ambulatory Visit: Payer: Self-pay | Admitting: Endocrinology

## 2020-02-29 ENCOUNTER — Other Ambulatory Visit: Payer: Self-pay | Admitting: Endocrinology

## 2020-02-29 ENCOUNTER — Ambulatory Visit (INDEPENDENT_AMBULATORY_CARE_PROVIDER_SITE_OTHER): Payer: Medicare Other

## 2020-02-29 DIAGNOSIS — R05 Cough: Secondary | ICD-10-CM | POA: Diagnosis not present

## 2020-02-29 DIAGNOSIS — R059 Cough, unspecified: Secondary | ICD-10-CM

## 2020-03-01 ENCOUNTER — Telehealth: Payer: Self-pay

## 2020-03-01 NOTE — Telephone Encounter (Signed)
Nodular opacity overlying the left hilum measuring 2.2 x 2.0 cm. Advise contrast enhanced chest CT to further evaluate. No edema or airspace opacity. Heart upper normal in size.  Please advise.

## 2020-03-02 ENCOUNTER — Telehealth: Payer: Self-pay | Admitting: Internal Medicine

## 2020-03-02 DIAGNOSIS — R911 Solitary pulmonary nodule: Secondary | ICD-10-CM

## 2020-03-02 NOTE — Telephone Encounter (Signed)
Spoke to pt by phone regarding abnormal cxr  [facility] for chest ct w cm, refer pulmonary- r/o malignancy vs sarcoid or other

## 2020-03-13 ENCOUNTER — Other Ambulatory Visit: Payer: Self-pay | Admitting: Obstetrics & Gynecology

## 2020-03-13 DIAGNOSIS — Z1231 Encounter for screening mammogram for malignant neoplasm of breast: Secondary | ICD-10-CM

## 2020-03-15 ENCOUNTER — Other Ambulatory Visit: Payer: Self-pay

## 2020-03-15 ENCOUNTER — Telehealth: Payer: Self-pay | Admitting: Internal Medicine

## 2020-03-15 ENCOUNTER — Ambulatory Visit
Admission: RE | Admit: 2020-03-15 | Discharge: 2020-03-15 | Disposition: A | Discharge status: Home / Self Care | Payer: Medicare Other | Source: Ambulatory Visit | Attending: Internal Medicine | Admitting: Internal Medicine

## 2020-03-15 DIAGNOSIS — R05 Cough: Secondary | ICD-10-CM | POA: Diagnosis not present

## 2020-03-15 DIAGNOSIS — R911 Solitary pulmonary nodule: Secondary | ICD-10-CM

## 2020-03-15 DIAGNOSIS — K469 Unspecified abdominal hernia without obstruction or gangrene: Secondary | ICD-10-CM | POA: Diagnosis not present

## 2020-03-15 DIAGNOSIS — R490 Dysphonia: Secondary | ICD-10-CM | POA: Diagnosis not present

## 2020-03-15 MED ORDER — IOPAMIDOL (ISOVUE-300) INJECTION 61%
75.0000 mL | Freq: Once | INTRAVENOUS | Status: AC | PRN
  Administered 2020-03-15: 75 mL via INTRAVENOUS

## 2020-03-15 NOTE — Telephone Encounter (Signed)
Daisy Dillon with [facility] called and was wondering if we had received a cardia genetic testing kit form for the patient. She said it would need a few signatures and the last 2 OV notes. Daisy Dillon can be reached at [phone]

## 2020-03-16 ENCOUNTER — Encounter: Payer: Self-pay | Admitting: Internal Medicine

## 2020-03-25 NOTE — Progress Notes (Signed)
Synopsis: Referred in October 2021 for lung nodule by Biagio Borg, MD  Subjective:   PATIENT ID: Daisy Dillon GENDER: female DOB: 08/09/1945, MRN: [id number]  Chief Complaint  Patient presents with  . Consult    referred by Dr Jenny Reichmann, Patient stated she is having non productive cough for some time    74 yo FM, PMH DMII, GERD, HTN, HLD. She states that she has had dry, non-productive cough. This has been going on since the spring time. No fevers, chills nightsweats. No weightloss.  For this evaluation by her PCP she had a chest x-ray ordered.  This chest x-ray revealed the possibility of a right midlung zone mass.  She underwent a CT scan of the chest which was normal.  This was likely just overlay of vasculature within the right hilum that gave a representation of a pseudotumor.    Past Medical History:  Diagnosis Date  . Anemia    long ago  . Arthritis   . Diabetes mellitus   . Diverticulosis of colon (without mention of hemorrhage)   . Elevated cholesterol   . Fibroid   . Gastritis   . GERD (gastroesophageal reflux disease)   . Hiatal hernia   . Hypertension   . IBS (irritable bowel syndrome)   . Positive ANA (antinuclear antibody) 04/23/2012  . Stricture and stenosis of esophagus 09/03/2005  . Urinary incontinence      Family History  Problem Relation Age of Onset  . Diabetes Mother   . Hypertension Mother   . Stroke Brother        x 2  . Bipolar disorder Brother   . Stroke Brother   . Diabetes Brother   . Breast cancer Maternal Aunt        Age 11's  . Colon cancer Neg Hx   . Stomach cancer Neg Hx   . Throat cancer Neg Hx   . Liver disease Neg Hx   . Colon polyps Neg Hx   . Esophageal cancer Neg Hx   . Rectal cancer Neg Hx      Past Surgical History:  Procedure Laterality Date  . ABDOMINAL HYSTERECTOMY  1985  . BACK SURGERY  11/2016  . CHOLECYSTECTOMY    . COLONOSCOPY  12/15/2007   diverticulosis  . ESOPHAGOGASTRODUODENOSCOPY  12/24/2012    normal   . KNEE SURGERY Right    arthroscopic  . UPPER GASTROINTESTINAL ENDOSCOPY      Social History   Socioeconomic History  . Marital status: Divorced    Spouse name: Not on file  . Number of children: 2  . Years of education: 13.5  . Highest education level: Not on file  Occupational History  . Occupation: Retired  Tobacco Use  . Smoking status: Never Smoker  . Smokeless tobacco: Never Used  Vaping Use  . Vaping Use: Never used  Substance and Sexual Activity  . Alcohol use: No    Alcohol/week: 0.0 standard drinks  . Drug use: No  . Sexual activity: Never    Birth control/protection: Surgical    Comment: 1ST intercourse- 17, partners- 38, divorced  Other Topics Concern  . Not on file  Social History Narrative   Lives at home w/ grandson   Right-handed   2 caffeinated drinks per day   Social Determinants of Health   Financial Resource Strain:   . Difficulty of Paying Living Expenses: Not on file  Food Insecurity:   . Worried About Charity fundraiser in the  Last Year: Not on file  . Ran Out of Food in the Last Year: Not on file  Transportation Needs:   . Lack of Transportation (Medical): Not on file  . Lack of Transportation (Non-Medical): Not on file  Physical Activity:   . Days of Exercise per Week: Not on file  . Minutes of Exercise per Session: Not on file  Stress:   . Feeling of Stress : Not on file  Social Connections:   . Frequency of Communication with Friends and Family: Not on file  . Frequency of Social Gatherings with Friends and Family: Not on file  . Attends Religious Services: Not on file  . Active Member of Clubs or Organizations: Not on file  . Attends Archivist Meetings: Not on file  . Marital Status: Not on file  Intimate Partner Violence:   . Fear of Current or Ex-Partner: Not on file  . Emotionally Abused: Not on file  . Physically Abused: Not on file  . Sexually Abused: Not on file     Allergies  Allergen Reactions  .  Codeine Phosphate     REACTION: unspecified, nausea, vomiting     Outpatient Medications Prior to Visit  Medication Sig Dispense Refill  . Alcohol Swabs (B-D SINGLE USE SWABS REGULAR) PADS Use as instructed. 100 each 1  . Ascorbic Acid (VITAMIN C) 1000 MG tablet Take 1,000 mg by mouth daily.    Marland Kitchen aspirin 81 MG tablet Take 81 mg by mouth daily.      . bisoprolol-hydrochlorothiazide (ZIAC) 2.5-6.25 MG tablet TAKE 1 TABLET BY MOUTH  DAILY 90 tablet 3  . Blood Glucose Monitoring Suppl ([place]) w/Device KIT Use as instructed to check blood sugar once daily. DX:E11.9 1 kit 0  . Cholecalciferol (VITAMIN D3) 1000 UNITS CAPS Take 1 capsule by mouth daily.      Marland Kitchen dexlansoprazole (DEXILANT) 60 MG capsule Take 1 capsule (60 mg total) by mouth daily. 90 capsule 3  . dicyclomine (BENTYL) 20 MG tablet TAKE 1 TABLET BY MOUTH 4  TIMES DAILY BEFORE MEALS  AND AT BEDTIME 360 tablet 1  . fexofenadine (ALLEGRA) 180 MG tablet Take 1 tablet (180 mg total) by mouth daily. (Patient taking differently: Take 180 mg by mouth daily. As needed) 30 tablet 2  . HYDROcodone-acetaminophen (NORCO/VICODIN) 5-325 MG tablet Take 1 tablet by mouth every 6 (six) hours as needed. 30 tablet 0  . losartan (COZAAR) 50 MG tablet TAKE 1 TABLET BY MOUTH  DAILY 90 tablet 3  . metFORMIN (GLUCOPHAGE) 500 MG tablet TAKE 1 TABLET BY MOUTH  TWICE DAILY WITH MEALS 180 tablet 0  . Multiple Vitamin (MULTIVITAMIN) tablet Take 1 tablet by mouth daily.      Glory Rosebush VERIO test strip USE  STRIP TO CHECK GLUCOSE ONCE DAILY 50 each 3  . pioglitazone (ACTOS) 15 MG tablet TAKE 1 TABLET BY MOUTH  DAILY 90 tablet 3  . pravastatin (PRAVACHOL) 20 MG tablet TAKE 1 TABLET BY MOUTH  DAILY 90 tablet 3  . verapamil (CALAN-SR) 240 MG CR tablet TAKE 1 TABLET BY MOUTH AT  BEDTIME 90 tablet 3  . potassium chloride (KLOR-CON) 10 MEQ tablet Take 1 tablet (10 mEq total) by mouth daily for 3 days. 3 tablet 0  . predniSONE (DELTASONE) 10 MG tablet 3  tabs by mouth per day for 3 days,2tabs per day for 3 days,1tab per day for 3 days 18 tablet 0  . triamcinolone (NASACORT) 55 MCG/ACT AERO nasal  inhaler Place 2 sprays into the nose daily. 1 Inhaler 12   No facility-administered medications prior to visit.    Review of Systems  Constitutional: Negative for chills, fever, malaise/fatigue and weight loss.  HENT: Negative for hearing loss, sore throat and tinnitus.   Eyes: Negative for blurred vision and double vision.  Respiratory: Positive for cough. Negative for hemoptysis, sputum production, shortness of breath, wheezing and stridor.   Cardiovascular: Negative for chest pain, palpitations, orthopnea, leg swelling and PND.  Gastrointestinal: Negative for abdominal pain, constipation, diarrhea, heartburn, nausea and vomiting.  Genitourinary: Negative for dysuria, hematuria and urgency.  Musculoskeletal: Negative for joint pain and myalgias.  Skin: Negative for itching and rash.  Neurological: Negative for dizziness, tingling, weakness and headaches.  Endo/Heme/Allergies: Negative for environmental allergies. Does not bruise/bleed easily.  Psychiatric/Behavioral: Negative for depression. The patient is not nervous/anxious and does not have insomnia.   All other systems reviewed and are negative.    Objective:  Physical Exam Vitals reviewed.  Constitutional:      General: She is not in acute distress.    Appearance: She is well-developed.  HENT:     Head: Normocephalic and atraumatic.  Eyes:     General: No scleral icterus.    Conjunctiva/sclera: Conjunctivae normal.     Pupils: Pupils are equal, round, and reactive to light.  Neck:     Vascular: No JVD.     Trachea: No tracheal deviation.  Cardiovascular:     Rate and Rhythm: Normal rate and regular rhythm.     Heart sounds: Normal heart sounds. No murmur heard.   Pulmonary:     Effort: Pulmonary effort is normal. No tachypnea, accessory muscle usage or respiratory distress.       Breath sounds: Normal breath sounds. No stridor. No wheezing, rhonchi or rales.  Abdominal:     General: Bowel sounds are normal. There is no distension.     Palpations: Abdomen is soft.     Tenderness: There is no abdominal tenderness.  Musculoskeletal:        General: No tenderness.     Cervical back: Neck supple.  Lymphadenopathy:     Cervical: No cervical adenopathy.  Skin:    General: Skin is warm and dry.     Capillary Refill: Capillary refill takes less than 2 seconds.     Findings: No rash.  Neurological:     Mental Status: She is alert and oriented to person, place, and time.  Psychiatric:        Behavior: Behavior normal.      Vitals:   03/26/20 1409  BP: 130/68  Pulse: 98  Temp: (!) 97.3 F (36.3 C)  TempSrc: Skin  SpO2: 98%  Weight: 198 lb 3.2 oz (89.9 kg)  Height: _0  (1.651 m)   98% on RA BMI Readings from Last 3 Encounters:  03/26/20 32.98 kg/m  12/29/19 33.81 kg/m  10/20/19 32.60 kg/m   Wt Readings from Last 3 Encounters:  03/26/20 198 lb 3.2 oz (89.9 kg)  12/29/19 197 lb (89.4 kg)  10/20/19 194 lb 6.4 oz (88.2 kg)     CBC    Component Value Date/Time   WBC 3.7 (L) 07/01/2019 1419   RBC 4.78 07/01/2019 1419   HGB 13.6 07/01/2019 1419   HCT 41.3 07/01/2019 1419   PLT 243.0 07/01/2019 1419   MCV 86.4 07/01/2019 1419   MCH 27.8 02/07/2018 0745   MCHC 32.9 07/01/2019 1419   RDW 14.2 07/01/2019 1419  LYMPHSABS 1.4 07/01/2019 1419   MONOABS 0.3 07/01/2019 1419   EOSABS 0.0 07/01/2019 1419   BASOSABS 0.0 07/01/2019 1419    Chest Imaging: 02/29/2020: Chest x-ray: Right midlung zone lung mass 03/15/2020: CT chest: No evidence of lung mass, abnormality on chest x-ray likely related to vasculature overlay and representation of pseudotumor. The patient's images have been independently reviewed by me.    Pulmonary Functions Testing Results: No flowsheet data found.  FeNO:   Pathology:   Echocardiogram:   Heart Catheterization:      Assessment & Plan:     ICD-10-CM   1. Abnormal CXR  R93.89   2. Cough  R05.9   3. Hiatal hernia  K44.9   4. Gastroesophageal reflux disease without esophagitis  K21.9   5. Seasonal allergies  J30.2     Discussion:  This is a 74 year old female that had evaluation for cough by primary care which included chest x-ray.  This had an abnormality which was followed up on CAT scan imaging.  Imaging reviewed as stated above.  No significant abnormality found.  Image on chest x-ray likely represents pseudotumor from overlay of vasculature.  As for the patient's cough.  Seems to be related to her reflux symptoms.  She does not go to bed till past 1 AM.  She eats late into the evening.  She also has a hiatal hernia.  All of which likely contribute.  Additionally she had allergies that caused cough in the springtime and disappeared with regular use of antihistamine.  She is not on this at this time.  Plan: Recommended restarting of Allegra 180 mg daily Recommend regular reflux precautions Continue PPI. No additional imaging follow-up needed. If cough does not go away she can follow-up with PCP or [place] in the future.    Current Outpatient Medications:  .  Alcohol Swabs (B-D SINGLE USE SWABS REGULAR) PADS, Use as instructed., Disp: 100 each, Rfl: 1 .  Ascorbic Acid (VITAMIN C) 1000 MG tablet, Take 1,000 mg by mouth daily., Disp: , Rfl:  .  aspirin 81 MG tablet, Take 81 mg by mouth daily.  , Disp: , Rfl:  .  bisoprolol-hydrochlorothiazide (ZIAC) 2.5-6.25 MG tablet, TAKE 1 TABLET BY MOUTH  DAILY, Disp: 90 tablet, Rfl: 3 .  Blood Glucose Monitoring Suppl (ONETOUCH VERIO FLEX SYSTEM) w/Device KIT, Use as instructed to check blood sugar once daily. DX:E11.9, Disp: 1 kit, Rfl: 0 .  Cholecalciferol (VITAMIN D3) 1000 UNITS CAPS, Take 1 capsule by mouth daily.  , Disp: , Rfl:  .  dexlansoprazole (DEXILANT) 60 MG capsule, Take 1 capsule (60 mg total) by mouth daily., Disp: 90 capsule, Rfl: 3 .   dicyclomine (BENTYL) 20 MG tablet, TAKE 1 TABLET BY MOUTH 4  TIMES DAILY BEFORE MEALS  AND AT BEDTIME, Disp: 360 tablet, Rfl: 1 .  fexofenadine (ALLEGRA) 180 MG tablet, Take 1 tablet (180 mg total) by mouth daily. (Patient taking differently: Take 180 mg by mouth daily. As needed), Disp: 30 tablet, Rfl: 2 .  HYDROcodone-acetaminophen (NORCO/VICODIN) 5-325 MG tablet, Take 1 tablet by mouth every 6 (six) hours as needed., Disp: 30 tablet, Rfl: 0 .  losartan (COZAAR) 50 MG tablet, TAKE 1 TABLET BY MOUTH  DAILY, Disp: 90 tablet, Rfl: 3 .  metFORMIN (GLUCOPHAGE) 500 MG tablet, TAKE 1 TABLET BY MOUTH  TWICE DAILY WITH MEALS, Disp: 180 tablet, Rfl: 0 .  Multiple Vitamin (MULTIVITAMIN) tablet, Take 1 tablet by mouth daily.  , Disp: , Rfl:  .  ONETOUCH  VERIO test strip, USE  STRIP TO CHECK GLUCOSE ONCE DAILY, Disp: 50 each, Rfl: 3 .  pioglitazone (ACTOS) 15 MG tablet, TAKE 1 TABLET BY MOUTH  DAILY, Disp: 90 tablet, Rfl: 3 .  pravastatin (PRAVACHOL) 20 MG tablet, TAKE 1 TABLET BY MOUTH  DAILY, Disp: 90 tablet, Rfl: 3 .  verapamil (CALAN-SR) 240 MG CR tablet, TAKE 1 TABLET BY MOUTH AT  BEDTIME, Disp: 90 tablet, Rfl: 3 .  potassium chloride (KLOR-CON) 10 MEQ tablet, Take 1 tablet (10 mEq total) by mouth daily for 3 days., Disp: 3 tablet, Rfl: 0  I spent 32 minutes dedicated to the care of this patient on the date of this encounter to include pre-visit review of records, face-to-face time with the patient discussing conditions above, post visit ordering of testing, clinical documentation with the electronic health record, making appropriate referrals as documented, and communicating necessary findings to members of the patients care team.   Garner Nash, DO [facility] 03/26/2020 2:24 PM

## 2020-03-26 ENCOUNTER — Ambulatory Visit: Payer: Medicare Other | Admitting: Pulmonary Disease

## 2020-03-26 ENCOUNTER — Other Ambulatory Visit: Payer: Self-pay

## 2020-03-26 ENCOUNTER — Encounter: Payer: Self-pay | Admitting: Pulmonary Disease

## 2020-03-26 VITALS — BP 130/68 | HR 98 | Temp 97.3°F | Ht 65.0 in | Wt 198.2 lb

## 2020-03-26 DIAGNOSIS — K219 Gastro-esophageal reflux disease without esophagitis: Secondary | ICD-10-CM | POA: Diagnosis not present

## 2020-03-26 DIAGNOSIS — R9389 Abnormal findings on diagnostic imaging of other specified body structures: Secondary | ICD-10-CM | POA: Diagnosis not present

## 2020-03-26 DIAGNOSIS — R059 Cough, unspecified: Secondary | ICD-10-CM

## 2020-03-26 DIAGNOSIS — Z23 Encounter for immunization: Secondary | ICD-10-CM | POA: Diagnosis not present

## 2020-03-26 DIAGNOSIS — K449 Diaphragmatic hernia without obstruction or gangrene: Secondary | ICD-10-CM

## 2020-03-26 DIAGNOSIS — J302 Other seasonal allergic rhinitis: Secondary | ICD-10-CM

## 2020-03-26 NOTE — Addendum Note (Signed)
Addended by: Elton Sin on: 03/26/2020 02:46 PM   Modules accepted: Orders

## 2020-03-26 NOTE — Patient Instructions (Addendum)
Thank you for visiting Dr. Valeta Harms at [facility]. Today we recommend the following:  Restart Allegra 180mg  daily  Watch your reflux precautions Avoid eating late at night Sleep with head elevated   Return if symptoms worsen or fail to improve.    Please do your part to reduce the spread of COVID-19.

## 2020-04-17 ENCOUNTER — Other Ambulatory Visit: Payer: Self-pay

## 2020-04-17 ENCOUNTER — Ambulatory Visit
Admission: RE | Admit: 2020-04-17 | Discharge: 2020-04-17 | Disposition: A | Discharge status: Home / Self Care | Payer: Medicare Other | Source: Ambulatory Visit | Attending: Obstetrics & Gynecology | Admitting: Obstetrics & Gynecology

## 2020-04-17 DIAGNOSIS — Z1231 Encounter for screening mammogram for malignant neoplasm of breast: Secondary | ICD-10-CM | POA: Diagnosis not present

## 2020-04-19 ENCOUNTER — Telehealth: Payer: Self-pay

## 2020-04-19 ENCOUNTER — Other Ambulatory Visit: Payer: Self-pay

## 2020-04-19 MED ORDER — VERAPAMIL HCL ER 240 MG PO TBCR
240.0000 mg | EXTENDED_RELEASE_TABLET | Freq: Every day | ORAL | 0 refills | Status: DC
Start: 2020-04-19 — End: 2021-02-21

## 2020-04-19 NOTE — Telephone Encounter (Signed)
RX was sent to the [facility] which was on her preferred list, no kmart is available.  Thank you!

## 2020-04-19 NOTE — Telephone Encounter (Signed)
Patient calling to request a week supply for verapamil sent to [facility] on [address]-she is out and waiting on mail order to come in but it will not be there till next Monday-

## 2020-04-20 ENCOUNTER — Telehealth: Payer: Self-pay

## 2020-04-20 NOTE — Telephone Encounter (Signed)
Received fax from [place] at hand patient assistance program that patient is due for re-enrollment of [place] patient assistance. The letter states due to current challenges in the healthcare environment, if your patient is still experiencing financial hardship, we strongly encourage them to send [place] their re-enrollment applications as soon as possible. I did call patient last week to notify her to get enrollment process started so we can fill out our part of the paper work. Patient did report last week she was going to initiate it and let our office know when she will come by to give [place] the papers to fill out. Left another message with patient to encourage her to initiate process.

## 2020-04-22 ENCOUNTER — Other Ambulatory Visit: Payer: Self-pay | Admitting: Endocrinology

## 2020-05-09 ENCOUNTER — Encounter: Payer: Self-pay | Admitting: Internal Medicine

## 2020-05-09 ENCOUNTER — Other Ambulatory Visit: Payer: Self-pay

## 2020-05-09 ENCOUNTER — Ambulatory Visit (INDEPENDENT_AMBULATORY_CARE_PROVIDER_SITE_OTHER): Payer: Medicare Other | Admitting: Internal Medicine

## 2020-05-09 VITALS — BP 130/80 | HR 84 | Temp 98.4°F | Ht 65.0 in | Wt 193.0 lb

## 2020-05-09 DIAGNOSIS — I10 Essential (primary) hypertension: Secondary | ICD-10-CM

## 2020-05-09 DIAGNOSIS — E119 Type 2 diabetes mellitus without complications: Secondary | ICD-10-CM | POA: Diagnosis not present

## 2020-05-09 DIAGNOSIS — M722 Plantar fascial fibromatosis: Secondary | ICD-10-CM

## 2020-05-09 NOTE — Patient Instructions (Signed)
You can take tylenol as needed for pain, and remember to wear soft soled shoes  You will be contacted regarding the referral for: podiatry (foot doctor)  Please continue all other medications as before, and refills have been done if requested.  Please have the pharmacy call with any other refills you may need.  Please continue your efforts at being more active, low cholesterol diet, and weight control  Please keep your appointments with your specialists as you may have planned

## 2020-05-09 NOTE — Progress Notes (Signed)
Subjective:    Patient ID: Daisy Dillon, female    DOB: [birth year], 74 y.o.   MRN: [id number]  HPI  Here with c/o pain to the right plantar foot worse mild to mod x 2 wks to the mid foot and towards the heel, worse in the am or after sitting for some time, no trauma.  Pt denies chest pain, increased sob or doe, wheezing, orthopnea, PND, increased LE swelling, palpitations, dizziness or syncope.  Pt denies new neurological symptoms such as new headache, or facial or extremity weakness or numbness   Pt denies polydipsia, polyuria, Past Medical History:  Diagnosis Date  . Anemia    long ago  . Arthritis   . Diabetes mellitus   . Diverticulosis of colon (without mention of hemorrhage)   . Elevated cholesterol   . Fibroid   . Gastritis   . GERD (gastroesophageal reflux disease)   . Hiatal hernia   . Hypertension   . IBS (irritable bowel syndrome)   . Positive ANA (antinuclear antibody) 04/23/2012  . Stricture and stenosis of esophagus 09/03/2005  . Urinary incontinence    Past Surgical History:  Procedure Laterality Date  . ABDOMINAL HYSTERECTOMY  1985  . BACK SURGERY  11/2016  . CHOLECYSTECTOMY    . COLONOSCOPY  12/15/2007   diverticulosis  . ESOPHAGOGASTRODUODENOSCOPY  12/24/2012   normal   . KNEE SURGERY Right    arthroscopic  . UPPER GASTROINTESTINAL ENDOSCOPY      reports that she has never smoked. She has never used smokeless tobacco. She reports that she does not drink alcohol and does not use drugs. family history includes Bipolar disorder in her brother; Breast cancer in her maternal aunt; Diabetes in her brother and mother; Hypertension in her mother; Stroke in her brother and brother. Allergies  Allergen Reactions  . Codeine Phosphate     REACTION: unspecified, nausea, vomiting   Current Outpatient Medications on File Prior to Visit  Medication Sig Dispense Refill  . Alcohol Swabs (B-D SINGLE USE SWABS REGULAR) PADS Use as instructed. 100 each 1  .  Ascorbic Acid (VITAMIN C) 1000 MG tablet Take 1,000 mg by mouth daily.    Marland Kitchen aspirin 81 MG tablet Take 81 mg by mouth daily.      . bisoprolol-hydrochlorothiazide (ZIAC) 2.5-6.25 MG tablet TAKE 1 TABLET BY MOUTH  DAILY 90 tablet 3  . Blood Glucose Monitoring Suppl ([place]) w/Device KIT Use as instructed to check blood sugar once daily. DX:E11.9 1 kit 0  . Cholecalciferol (VITAMIN D3) 1000 UNITS CAPS Take 1 capsule by mouth daily.      Marland Kitchen dexlansoprazole (DEXILANT) 60 MG capsule Take 1 capsule (60 mg total) by mouth daily. 90 capsule 3  . dicyclomine (BENTYL) 20 MG tablet TAKE 1 TABLET BY MOUTH 4  TIMES DAILY BEFORE MEALS  AND AT BEDTIME 360 tablet 1  . fexofenadine (ALLEGRA) 180 MG tablet Take 1 tablet (180 mg total) by mouth daily. (Patient taking differently: Take 180 mg by mouth daily. As needed) 30 tablet 2  . HYDROcodone-acetaminophen (NORCO/VICODIN) 5-325 MG tablet Take 1 tablet by mouth every 6 (six) hours as needed. 30 tablet 0  . losartan (COZAAR) 50 MG tablet TAKE 1 TABLET BY MOUTH  DAILY 90 tablet 3  . metFORMIN (GLUCOPHAGE) 500 MG tablet TAKE 1 TABLET BY MOUTH  TWICE DAILY WITH MEALS 180 tablet 0  . Multiple Vitamin (MULTIVITAMIN) tablet Take 1 tablet by mouth daily.      Marland Kitchen  ONETOUCH VERIO test strip USE  STRIP TO CHECK GLUCOSE ONCE DAILY 50 each 3  . pioglitazone (ACTOS) 15 MG tablet TAKE 1 TABLET BY MOUTH  DAILY 90 tablet 3  . potassium chloride (KLOR-CON) 10 MEQ tablet Take 10 mEq by mouth daily.    . pravastatin (PRAVACHOL) 20 MG tablet TAKE 1 TABLET BY MOUTH  DAILY 90 tablet 3  . verapamil (CALAN-SR) 240 MG CR tablet Take 1 tablet (240 mg total) by mouth at bedtime. 7 tablet 0   No current facility-administered medications on file prior to visit.   Review of Systems All otherwise neg per pt     Objective:   Physical Exam BP 130/80 (BP Location: Left Arm, Patient Position: Sitting, Cuff Size: Large)   Pulse 84   Temp 98.4 F (36.9 C) (Oral)   Ht $R'5\' 5"'RN$   (1.651 m)   Wt 193 lb (87.5 kg)   SpO2 97%   BMI 32.12 kg/m  VS noted,  Constitutional: Pt appears in NAD HENT: Head: NCAT.  Right Ear: External ear normal.  Left Ear: External ear normal.  Eyes: . Pupils are equal, round, and reactive to light. Conjunctivae and EOM are normal Nose: without d/c or deformity Neck: Neck supple. Gross normal ROM Cardiovascular: Normal rate and regular rhythm.   Pulmonary/Chest: Effort normal and breath sounds without rales or wheezing.  Right foot with moderate arch loss, neurovasc intact but tender to plantar mid foot and heel Neurological: Pt is alert. At baseline orientation, motor grossly intact Skin: Skin is warm. No rashes, other new lesions, no LE edema Psychiatric: Pt behavior is normal without agitation  All otherwise neg per pt Lab Results  Component Value Date   WBC 3.7 (L) 07/01/2019   HGB 13.6 07/01/2019   HCT 41.3 07/01/2019   PLT 243.0 07/01/2019   GLUCOSE 100 (H) 12/26/2019   CHOL 139 12/26/2019   TRIG 89.0 12/26/2019   HDL 56.50 12/26/2019   LDLCALC 65 12/26/2019   ALT 36 (H) 12/26/2019   AST 29 12/26/2019   NA 141 12/26/2019   K 3.4 (L) 12/26/2019   CL 106 12/26/2019   CREATININE 1.03 12/26/2019   BUN 15 12/26/2019   CO2 25 12/26/2019   TSH 1.29 07/01/2019   INR 1.12 02/07/2018   HGBA1C 5.7 12/26/2019   MICROALBUR <0.7 07/01/2019       Assessment & Plan:

## 2020-05-10 ENCOUNTER — Encounter: Payer: Self-pay | Admitting: Internal Medicine

## 2020-05-10 DIAGNOSIS — M722 Plantar fascial fibromatosis: Secondary | ICD-10-CM | POA: Insufficient documentation

## 2020-05-10 NOTE — Assessment & Plan Note (Signed)
stable overall by history and exam, recent data reviewed with pt, and pt to continue medical treatment as before,  to f/u any worsening symptoms or concerns  

## 2020-05-10 NOTE — Assessment & Plan Note (Addendum)
Mild to mod, d/w pt, for tylenol prn, stretching exercise, consider otc orthotic to start, soft soled shoes,  and podiatry for possible cortisone  I spent 31 minutes in preparing to see the patient by review of recent labs, imaging and procedures, obtaining and reviewing separately obtained history, communicating with the patient and family or caregiver, ordering medications, tests or procedures, and documenting clinical information in the EHR including the differential Dx, treatment, and any further evaluation and other management of plantar fasciitis, htn, dm

## 2020-05-18 ENCOUNTER — Ambulatory Visit: Payer: Medicare Other | Admitting: Podiatry

## 2020-05-18 ENCOUNTER — Other Ambulatory Visit: Payer: Self-pay

## 2020-05-18 ENCOUNTER — Ambulatory Visit (INDEPENDENT_AMBULATORY_CARE_PROVIDER_SITE_OTHER): Payer: Medicare Other

## 2020-05-18 DIAGNOSIS — M79671 Pain in right foot: Secondary | ICD-10-CM

## 2020-05-18 DIAGNOSIS — M216X1 Other acquired deformities of right foot: Secondary | ICD-10-CM | POA: Diagnosis not present

## 2020-05-18 DIAGNOSIS — M21862 Other specified acquired deformities of left lower leg: Secondary | ICD-10-CM

## 2020-05-18 DIAGNOSIS — M216X2 Other acquired deformities of left foot: Secondary | ICD-10-CM | POA: Diagnosis not present

## 2020-05-18 DIAGNOSIS — M76821 Posterior tibial tendinitis, right leg: Secondary | ICD-10-CM

## 2020-05-18 DIAGNOSIS — M21861 Other specified acquired deformities of right lower leg: Secondary | ICD-10-CM

## 2020-05-18 DIAGNOSIS — M2142 Flat foot [pes planus] (acquired), left foot: Secondary | ICD-10-CM | POA: Diagnosis not present

## 2020-05-18 DIAGNOSIS — M2141 Flat foot [pes planus] (acquired), right foot: Secondary | ICD-10-CM | POA: Diagnosis not present

## 2020-05-18 NOTE — Patient Instructions (Signed)
Look for Voltaren gel at the pharmacy over the counter or online (also known as diclofenac 1% gel). Apply to the painful areas 3-4x daily with the supplied dosing card. Allow to dry for 10 minutes before going into socks/shoes    Posterior Tibial Tendinitis  Posterior tibial tendinitis is irritation of a tendon called the posterior tibial tendon. Your posterior tibial tendon is a cord-like tissue that connects bones of your lower leg and foot to a muscle that: 1. Supports your arch. 2. Helps you raise up on your toes. 3. Helps you turn your foot down and in. This condition causes foot and ankle pain. It can also lead to a flat foot. What are the causes? This condition is most often caused by repeated stress to the tendon (overuse injury). It can also be caused by a sudden injury that stresses the tendon, such as landing on your foot after jumping or falling. What increases the risk? This condition is more likely to develop in: 1. People who play a sport that involves putting a lot of pressure on the feet, such as: 1. Basketball. 2. Tennis. 3. Soccer. 4. Hockey. 2. Runners. 3. Females who are older than 75 years of age and are overweight. 4. People with diabetes. 5. People with decreased foot stability. 6. People with flat feet. What are the signs or symptoms? Symptoms include: 1. Pain in the inner ankle. 2. Pain at the arch of your foot. 3. Pain that gets worse with running, walking, or standing. 4. Swelling on the inside of your ankle and foot. 5. Weakness in your ankle or foot. 6. Inability to stand up on tiptoe. 7. Flattening of the arch of your foot. How is this diagnosed? This condition may be diagnosed based on: 1. Your symptoms. 2. Your medical history. 3. A physical exam. 4. Tests, such as: 1. X-ray. 2. MRI. 3. Ultrasound. How is this treated? This condition may be treated by: 1. Putting ice to the injured area. 2. Taking NSAIDs, such as ibuprofen, to reduce pain  and swelling. 3. Wearing a special shoe or shoe insert to support your arch (orthotic). 4. Having physical therapy. 5. Replacing high-impact exercise with low-impact exercise, such as swimming or cycling. If your symptoms do not improve with these treatments, you may need to wear a splint, removable walking boot, or short leg cast for 6-8 weeks to keep your foot and ankle still (immobilized). Follow these instructions at home: If you have a cast, splint, or boot:  Keep it clean and dry.  Check the skin around it every day. Tell your health care provider about any concerns. If you have a cast:  Do not stick anything inside it to scratch your skin. Doing that increases your risk of infection.  You may put lotion on dry skin around the edges of the cast. Do not put lotion on the skin underneath the cast. If you have a splint or boot:  Wear it as told by your health care provider. Remove it only as told by your health care provider.  Loosen it if your toes tingle, become numb, or turn cold and blue. Bathing 1. Do not take baths, swim, or use a hot tub until your health care provider approves. Ask your health care provider if you may take showers. 2. If your cast, splint, or boot is not waterproof: ? Do not let it get wet. ? Cover it with a waterproof covering while you take a bath or a shower. Managing pain and   swelling   1. If directed, put ice on the injured area. ? If you have a removable splint or boot, remove it as told by your health care provider. ? Put ice in a plastic bag. ? Place a towel between your skin and the bag or between your cast and the bag. ? Leave the ice on for 20 minutes, 2-3 times a day. 2. Move your toes often to reduce stiffness and swelling. 3. Raise (elevate) the injured area above the level of your heart while you are sitting or lying down. Activity  Do not use the injured foot to support your body weight until your health care provider says that you  can. Use crutches as told by your health care provider.  Do not do activities that make pain or swelling worse.  Ask your health care provider when it is safe to drive if you have a cast, splint, or boot on your foot.  Return to your normal activities as told by your health care provider. Ask your health care provider what activities are safe for you.  Do exercises as told by your health care provider. General instructions  Take over-the-counter and prescription medicines only as told by your health care provider.  If you have an orthotic, use it as told by your health care provider.  Keep all follow-up visits as told by your health care provider. This is important. How is this prevented?  Wear footwear that is appropriate to your athletic activity.  Avoid athletic activities that cause pain or swelling in your ankle or foot.  Before being active, do range-of-motion and stretching exercises.  If you develop pain or swelling while training, stop training.  If you have pain or swelling that does not improve after a few days of rest, see your health care provider.  If you start a new athletic activity, start gradually so you can build up your strength and flexibility. Contact a health care provider if:  Your symptoms get worse.  Your symptoms do not improve in 6-8 weeks.  You develop new, unexplained symptoms.  Your splint, boot, or cast gets damaged. Summary  Posterior tibial tendinitis is irritation of a tendon called the posterior tibial tendon.  This condition is most often caused by repeated stress to the tendon (overuse injury).  This condition causes foot pain and ankle pain. It can also lead to a flat foot.  This condition may be treated by not doing high-impact activities, applying ice, having physical therapy, wearing orthotics, and wearing a cast, splint, or boot if needed. This information is not intended to replace advice given to you by your health care  provider. Make sure you discuss any questions you have with your health care provider. Document Revised: 09/28/2018 Document Reviewed: 08/05/2018 Else[employer] Patient Education  2020 [employer]  Posterior Tibial Tendinitis Rehab Ask your health care provider which exercises are safe for you. Do exercises exactly as told by your health care provider and adjust them as directed. It is normal to feel mild stretching, pulling, tightness, or discomfort as you do these exercises. Stop right away if you feel sudden pain or your pain gets worse. Do not begin these exercises until told by your health care provider. Stretching and range-of-motion exercises These exercises warm up your muscles and joints and improve the movement and flexibility in your ankle and foot. These exercises may also help to relieve pain. Standing wall calf stretch, knee straight   4. Stand with your hands against a wall.   5. Extend your left / right leg behind you, and bend your front knee slightly. If directed, place a folded washcloth under the arch of your foot for support. 6. Point the toes of your back foot slightly inward. 7. Keeping your heels on the floor and your back knee straight, shift your weight toward the wall. Do not allow your back to arch. You should feel a gentle stretch in your upper left / right calf. 8. Hold this position for 10 seconds. Repeat 10 times. Complete this exercise 2 times a day. Standing wall calf stretch, knee bent 7. Stand with your hands against a wall. 8. Extend your left / right leg behind you, and bend your front knee slightly. If directed, place a folded washcloth under the arch of your foot for support. 9. Point the toes of your back foot slightly inward. 10. Unlock your back knee so it is bent. Keep your heels on the floor. You should feel a gentle stretch deep in your lower left / right calf. 11. Hold this position for 10 seconds. Repeat 10 times. Complete this exercise 2 times a  day. Strengthening exercises These exercises build strength and endurance in your ankle and foot. Endurance is the ability to use your muscles for a long time, even after they get tired. Ankle inversion with band 8. Secure one end of a rubber exercise band or tubing to a fixed object, such as a table leg or a pole, that will stay still when the band is pulled. 9. Loop the other end of the band around the middle of your left / right foot. 10. Sit on the floor facing the object with your left / right leg extended. The band or tube should be slightly tense when your foot is relaxed. 11. Leading with your big toe, slowly bring your left / right foot and ankle inward, toward your other foot (inversion). 12. Hold this position for 10 seconds. 13. Slowly return your foot to the starting position. Repeat 10 times. Complete this exercise 2 times a day. Towel curls   5. Sit in a chair on a non-carpeted surface, and put your feet on the floor. 6. Place a towel in front of your feet. 7. Keeping your heel on the floor, put your left / right foot on the towel. 8. Pull the towel toward you by grabbing the towel with your toes and curling them under. Keep your heel on the floor while you do this. 9. Let your toes relax. 10. Grab the towel with your toes again. Keep going until the towel is completely underneath your foot. Repeat 10 times. Complete this exercise 2 times a day. Balance exercise This exercise improves or maintains your balance. Balance is important in preventing falls. Single leg stand 6. Without wearing shoes, stand near a railing or in a doorway. You may hold on to the railing or door frame as needed for balance. 7. Stand on your left / right foot. Keep your big toe down on the floor and try to keep your arch lifted. ? If balancing in this position is too easy, try the exercise with your eyes closed or while standing on a pillow. 8. Hold this position for 10 seconds. Repeat 10 times.  Complete this exercise 2 times a day. This information is not intended to replace advice given to you by your health care provider. Make sure you discuss any questions you have with your health care provider.  

## 2020-05-22 ENCOUNTER — Telehealth: Payer: Self-pay | Admitting: *Deleted

## 2020-05-22 NOTE — Telephone Encounter (Signed)
Patient unable to put on brace correctly. Please call to explain

## 2020-05-22 NOTE — Progress Notes (Signed)
  Subjective:  Patient ID: Daisy Dillon, female    DOB: 1945-06-29,  MRN: [id number]  Chief Complaint  Patient presents with  . Foot Pain    right foot pain in the arch    74 y.o. female presents with the above complaint. History confirmed with patient.  This is been going on for about a month for her.  She states that she gets burning sensations and it feels like aching intending.  Objective:  Physical Exam: warm, good capillary refill, no trophic changes or ulcerative lesions, normal DP and PT pulses and normal sensory exam.  Right Foot: There is pain along the posterior tibial tendon especially at the insertion on the navicular.  Mild pes planus is present.  She is able to do bilateral and single heel rises, the right heel rise has some difficulty  Radiographs: X-ray of left foot: Mild pes planus deformity with hallux valgus and met primus varus present Assessment:   1. Foot pain, right   2. Posterior tibial tendinitis of right lower extremity   3. Pes planus of both feet   4. Gastrocnemius equinus of left lower extremity   5. Gastrocnemius equinus of right lower extremity      Plan:  Patient was evaluated and treated and all questions answered.   X-ray reviewed with patient.  I think she likely has posterior tibial tendon dysfunction and tendinitis.  Believe this is secondary to her pes planus, she also has hallux valgus.  I recommend an MRI to evaluate the posterior tibial tendon for any tears, tenosynovitis, or inflammation. -Recommend stretching and icing of the right lower extremity.  Information provided. -Recommended Voltaren gel -She can take Tylenol as needed for pain.  She has a history of hiatal hernia and IBS, would like to avoid putting her on any standing doses of NSAIDs -Return in 1 month after MRI  No follow-ups on file.

## 2020-05-24 ENCOUNTER — Other Ambulatory Visit: Payer: Self-pay | Admitting: Endocrinology

## 2020-05-25 ENCOUNTER — Other Ambulatory Visit: Payer: Self-pay | Admitting: Gastroenterology

## 2020-05-29 ENCOUNTER — Other Ambulatory Visit: Payer: Self-pay | Admitting: Podiatry

## 2020-05-29 DIAGNOSIS — M2142 Flat foot [pes planus] (acquired), left foot: Secondary | ICD-10-CM

## 2020-05-29 DIAGNOSIS — M2141 Flat foot [pes planus] (acquired), right foot: Secondary | ICD-10-CM

## 2020-06-01 ENCOUNTER — Other Ambulatory Visit: Payer: Self-pay

## 2020-06-01 ENCOUNTER — Ambulatory Visit
Admission: RE | Admit: 2020-06-01 | Discharge: 2020-06-01 | Disposition: A | Discharge status: Home / Self Care | Payer: Medicare Other | Source: Ambulatory Visit | Attending: Podiatry | Admitting: Podiatry

## 2020-06-01 DIAGNOSIS — R208 Other disturbances of skin sensation: Secondary | ICD-10-CM | POA: Diagnosis not present

## 2020-06-01 DIAGNOSIS — M76821 Posterior tibial tendinitis, right leg: Secondary | ICD-10-CM

## 2020-06-01 DIAGNOSIS — M25571 Pain in right ankle and joints of right foot: Secondary | ICD-10-CM | POA: Diagnosis not present

## 2020-06-01 DIAGNOSIS — R600 Localized edema: Secondary | ICD-10-CM | POA: Diagnosis not present

## 2020-06-22 ENCOUNTER — Ambulatory Visit: Payer: Medicare Other | Admitting: Podiatry

## 2020-06-22 ENCOUNTER — Other Ambulatory Visit: Payer: Self-pay

## 2020-06-22 DIAGNOSIS — M2142 Flat foot [pes planus] (acquired), left foot: Secondary | ICD-10-CM

## 2020-06-22 DIAGNOSIS — M2141 Flat foot [pes planus] (acquired), right foot: Secondary | ICD-10-CM | POA: Diagnosis not present

## 2020-06-22 DIAGNOSIS — M216X1 Other acquired deformities of right foot: Secondary | ICD-10-CM

## 2020-06-22 DIAGNOSIS — M76821 Posterior tibial tendinitis, right leg: Secondary | ICD-10-CM | POA: Diagnosis not present

## 2020-06-22 DIAGNOSIS — M21861 Other specified acquired deformities of right lower leg: Secondary | ICD-10-CM

## 2020-06-22 MED ORDER — METHYLPREDNISOLONE 4 MG PO TBPK: ORAL_TABLET | ORAL | 0 refills | Status: DC

## 2020-06-22 NOTE — Patient Instructions (Signed)
Posterior Tibial Tendinitis  Posterior tibial tendinitis is irritation of a tendon called the posterior tibial tendon. Your posterior tibial tendon is a cord-like tissue that connects bones of your lower leg and foot to a muscle that: 1. Supports your arch. 2. Helps you raise up on your toes. 3. Helps you turn your foot down and in. This condition causes foot and ankle pain. It can also lead to a flat foot. What are the causes? This condition is most often caused by repeated stress to the tendon (overuse injury). It can also be caused by a sudden injury that stresses the tendon, such as landing on your foot after jumping or falling. What increases the risk? This condition is more likely to develop in: 1. People who play a sport that involves putting a lot of pressure on the feet, such as: 1. Basketball. 2. Tennis. 3. Soccer. 4. Hockey. 2. Runners. 3. Females who are older than 76 years of age and are overweight. 4. People with diabetes. 5. People with decreased foot stability. 6. People with flat feet. What are the signs or symptoms? Symptoms include: 1. Pain in the inner ankle. 2. Pain at the arch of your foot. 3. Pain that gets worse with running, walking, or standing. 4. Swelling on the inside of your ankle and foot. 5. Weakness in your ankle or foot. 6. Inability to stand up on tiptoe. 7. Flattening of the arch of your foot. How is this diagnosed? This condition may be diagnosed based on: 1. Your symptoms. 2. Your medical history. 3. A physical exam. 4. Tests, such as: 1. X-ray. 2. MRI. 3. Ultrasound. How is this treated? This condition may be treated by: 1. Putting ice to the injured area. 2. Taking NSAIDs, such as ibuprofen, to reduce pain and swelling. 3. Wearing a special shoe or shoe insert to support your arch (orthotic). 4. Having physical therapy. 5. Replacing high-impact exercise with low-impact exercise, such as swimming or cycling. If your symptoms do not  improve with these treatments, you may need to wear a splint, removable walking boot, or short leg cast for 6-8 weeks to keep your foot and ankle still (immobilized). Follow these instructions at home: If you have a cast, splint, or boot:  Keep it clean and dry.  Check the skin around it every day. Tell your health care provider about any concerns. If you have a cast:  Do not stick anything inside it to scratch your skin. Doing that increases your risk of infection.  You may put lotion on dry skin around the edges of the cast. Do not put lotion on the skin underneath the cast. If you have a splint or boot:  Wear it as told by your health care provider. Remove it only as told by your health care provider.  Loosen it if your toes tingle, become numb, or turn cold and blue. Bathing 1. Do not take baths, swim, or use a hot tub until your health care provider approves. Ask your health care provider if you may take showers. 2. If your cast, splint, or boot is not waterproof: ? Do not let it get wet. ? Cover it with a waterproof covering while you take a bath or a shower. Managing pain and swelling   1. If directed, put ice on the injured area. ? If you have a removable splint or boot, remove it as told by your health care provider. ? Put ice in a plastic bag. ? Place a towel between your   skin and the bag or between your cast and the bag. ? Leave the ice on for 20 minutes, 2-3 times a day. 2. Move your toes often to reduce stiffness and swelling. 3. Raise (elevate) the injured area above the level of your heart while you are sitting or lying down. Activity  Do not use the injured foot to support your body weight until your health care provider says that you can. Use crutches as told by your health care provider.  Do not do activities that make pain or swelling worse.  Ask your health care provider when it is safe to drive if you have a cast, splint, or boot on your foot.  Return to  your normal activities as told by your health care provider. Ask your health care provider what activities are safe for you.  Do exercises as told by your health care provider. General instructions  Take over-the-counter and prescription medicines only as told by your health care provider.  If you have an orthotic, use it as told by your health care provider.  Keep all follow-up visits as told by your health care provider. This is important. How is this prevented?  Wear footwear that is appropriate to your athletic activity.  Avoid athletic activities that cause pain or swelling in your ankle or foot.  Before being active, do range-of-motion and stretching exercises.  If you develop pain or swelling while training, stop training.  If you have pain or swelling that does not improve after a few days of rest, see your health care provider.  If you start a new athletic activity, start gradually so you can build up your strength and flexibility. Contact a health care provider if:  Your symptoms get worse.  Your symptoms do not improve in 6-8 weeks.  You develop new, unexplained symptoms.  Your splint, boot, or cast gets damaged. Summary  Posterior tibial tendinitis is irritation of a tendon called the posterior tibial tendon.  This condition is most often caused by repeated stress to the tendon (overuse injury).  This condition causes foot pain and ankle pain. It can also lead to a flat foot.  This condition may be treated by not doing high-impact activities, applying ice, having physical therapy, wearing orthotics, and wearing a cast, splint, or boot if needed. This information is not intended to replace advice given to you by your health care provider. Make sure you discuss any questions you have with your health care provider. Document Revised: 09/28/2018 Document Reviewed: 08/05/2018 Elsevier Patient Education  2020 [employer]  Posterior Tibial Tendinitis Rehab Ask  your health care provider which exercises are safe for you. Do exercises exactly as told by your health care provider and adjust them as directed. It is normal to feel mild stretching, pulling, tightness, or discomfort as you do these exercises. Stop right away if you feel sudden pain or your pain gets worse. Do not begin these exercises until told by your health care provider. Stretching and range-of-motion exercises These exercises warm up your muscles and joints and improve the movement and flexibility in your ankle and foot. These exercises may also help to relieve pain. Standing wall calf stretch, knee straight   4. Stand with your hands against a wall. 5. Extend your left / right leg behind you, and bend your front knee slightly. If directed, place a folded washcloth under the arch of your foot for support. 6. Point the toes of your back foot slightly inward. 7. Keeping your heels   on the floor and your back knee straight, shift your weight toward the wall. Do not allow your back to arch. You should feel a gentle stretch in your upper left / right calf. 8. Hold this position for 10 seconds. Repeat 10 times. Complete this exercise 2 times a day. Standing wall calf stretch, knee bent 7. Stand with your hands against a wall. 8. Extend your left / right leg behind you, and bend your front knee slightly. If directed, place a folded washcloth under the arch of your foot for support. 9. Point the toes of your back foot slightly inward. 10. Unlock your back knee so it is bent. Keep your heels on the floor. You should feel a gentle stretch deep in your lower left / right calf. 11. Hold this position for 10 seconds. Repeat 10 times. Complete this exercise 2 times a day. Strengthening exercises These exercises build strength and endurance in your ankle and foot. Endurance is the ability to use your muscles for a long time, even after they get tired. Ankle inversion with band 8. Secure one end of a  rubber exercise band or tubing to a fixed object, such as a table leg or a pole, that will stay still when the band is pulled. 9. Loop the other end of the band around the middle of your left / right foot. 10. Sit on the floor facing the object with your left / right leg extended. The band or tube should be slightly tense when your foot is relaxed. 11. Leading with your big toe, slowly bring your left / right foot and ankle inward, toward your other foot (inversion). 12. Hold this position for 10 seconds. 13. Slowly return your foot to the starting position. Repeat 10 times. Complete this exercise 2 times a day. Towel curls   5. Sit in a chair on a non-carpeted surface, and put your feet on the floor. 6. Place a towel in front of your feet. 7. Keeping your heel on the floor, put your left / right foot on the towel. 8. Pull the towel toward you by grabbing the towel with your toes and curling them under. Keep your heel on the floor while you do this. 9. Let your toes relax. 10. Grab the towel with your toes again. Keep going until the towel is completely underneath your foot. Repeat 10 times. Complete this exercise 2 times a day. Balance exercise This exercise improves or maintains your balance. Balance is important in preventing falls. Single leg stand 6. Without wearing shoes, stand near a railing or in a doorway. You may hold on to the railing or door frame as needed for balance. 7. Stand on your left / right foot. Keep your big toe down on the floor and try to keep your arch lifted. ? If balancing in this position is too easy, try the exercise with your eyes closed or while standing on a pillow. 8. Hold this position for 10 seconds. Repeat 10 times. Complete this exercise 2 times a day. This information is not intended to replace advice given to you by your health care provider. Make sure you discuss any questions you have with your health care provider.  

## 2020-06-25 DIAGNOSIS — E119 Type 2 diabetes mellitus without complications: Secondary | ICD-10-CM | POA: Diagnosis not present

## 2020-06-25 LAB — HM DIABETES EYE EXAM

## 2020-06-25 NOTE — Progress Notes (Signed)
  Subjective:  Patient ID: Daisy Dillon, female    DOB: Dec 07, 1945,  MRN: [id number]  Still has some burning sensation and pain with swelling  75 y.o. female returns with the above complaint. History confirmed with patient.  She completed the MRI. Overall improving  Objective:  Physical Exam: warm, good capillary refill, no trophic changes or ulcerative lesions, normal DP and PT pulses and normal sensory exam.  Right Foot: There is pain along the posterior tibial tendon has improved significantly since last visit  Radiographs: X-ray of left foot: Mild pes planus deformity with hallux valgus and met primus varus present  Study Result  Narrative & Impression  CLINICAL DATA:  Right ankle pain, swelling and tenderness for 1 month. Burning in the right foot.  EXAM: MRI OF THE RIGHT ANKLE WITHOUT CONTRAST  TECHNIQUE: Multiplanar, multisequence MR imaging of the ankle was performed. No intravenous contrast was administered.  COMPARISON:  Plain films right foot 05/18/2020.  FINDINGS: TENDONS  Peroneal: Intact.  Posteromedial: Intact.  Anterior: Intact.  Achilles: Intact.  Plantar Fascia: Intact.  No evidence of plantar fasciitis.  LIGAMENTS  Lateral: Intact.  Medial: Intact.  CARTILAGE  Ankle Joint: Normal. No osteochondral lesion of the talar dome or joint effusion.  Subtalar Joints/Sinus Tarsi: Normal.  Bones: Normal.  No fracture, stress change or worrisome lesion.  Other: Mild subcutaneous edema about the ankle.  IMPRESSION: Mild subcutaneous edema about the ankle is likely due to dependent change. The exam is otherwise negative. Tendons and ligaments are intact.   Electronically Signed   By: Inge Rise M.D.   On: 06/02/2020 09:39     Assessment:   1. Posterior tibial tendinitis of right lower extremity   2. Pes planus of both feet   3. Gastrocnemius equinus of right lower extremity      Plan:  Patient  was evaluated and treated and all questions answered.   Continue non surgical therapy, she has made some improvement so far. No indication on MRI for surgical intervention at this point. She will continue her home exercise program. Limited as we cannot use oral NSAIDs because of her hiatal hernia. Recommended methylpred taper for anti inflammatory relief. Long term I think she would benefit from bracing support. I would like her to be fitted for a Richie style brace. She will be seen by our pedorthist Velora Heckler for this.  Return in about 2 months (around 08/20/2020).

## 2020-07-02 ENCOUNTER — Other Ambulatory Visit: Payer: Medicare Other

## 2020-07-03 ENCOUNTER — Other Ambulatory Visit: Payer: Medicare Other

## 2020-07-04 ENCOUNTER — Other Ambulatory Visit: Payer: Medicare Other

## 2020-07-04 ENCOUNTER — Other Ambulatory Visit: Payer: Self-pay

## 2020-07-04 ENCOUNTER — Telehealth: Payer: Self-pay | Admitting: Internal Medicine

## 2020-07-04 NOTE — Telephone Encounter (Signed)
LVM for pt to rtn my call to schedule AWV with [facility]. Please schedule AWV with [facility] if pt calls the office.

## 2020-07-05 ENCOUNTER — Encounter: Payer: Self-pay | Admitting: Internal Medicine

## 2020-07-05 ENCOUNTER — Ambulatory Visit (INDEPENDENT_AMBULATORY_CARE_PROVIDER_SITE_OTHER): Payer: Medicare Other | Admitting: Internal Medicine

## 2020-07-05 VITALS — BP 142/82 | HR 75 | Temp 98.4°F | Ht 65.0 in | Wt 193.0 lb

## 2020-07-05 DIAGNOSIS — I10 Essential (primary) hypertension: Secondary | ICD-10-CM

## 2020-07-05 DIAGNOSIS — Z0000 Encounter for general adult medical examination without abnormal findings: Secondary | ICD-10-CM | POA: Diagnosis not present

## 2020-07-05 DIAGNOSIS — E119 Type 2 diabetes mellitus without complications: Secondary | ICD-10-CM

## 2020-07-05 DIAGNOSIS — E876 Hypokalemia: Secondary | ICD-10-CM | POA: Diagnosis not present

## 2020-07-05 LAB — HEPATIC FUNCTION PANEL
ALT: 26 U/L (ref 0–35)
AST: 22 U/L (ref 0–37)
Albumin: 4.1 g/dL (ref 3.5–5.2)
Alkaline Phosphatase: 99 U/L (ref 39–117)
Bilirubin, Direct: 0.1 mg/dL (ref 0.0–0.3)
Total Bilirubin: 0.4 mg/dL (ref 0.2–1.2)
Total Protein: 7.7 g/dL (ref 6.0–8.3)

## 2020-07-05 LAB — HEMOGLOBIN A1C: Hgb A1c MFr Bld: 5.8 % (ref 4.6–6.5)

## 2020-07-05 LAB — URINALYSIS, ROUTINE W REFLEX MICROSCOPIC
Bilirubin Urine: NEGATIVE
Hgb urine dipstick: NEGATIVE
Ketones, ur: NEGATIVE
Leukocytes,Ua: NEGATIVE
Nitrite: NEGATIVE
RBC / HPF: NONE SEEN (ref 0–?)
Specific Gravity, Urine: 1.015 (ref 1.000–1.030)
Total Protein, Urine: NEGATIVE
Urine Glucose: NEGATIVE
Urobilinogen, UA: 0.2 (ref 0.0–1.0)
WBC, UA: NONE SEEN (ref 0–?)
pH: 6 (ref 5.0–8.0)

## 2020-07-05 LAB — LIPID PANEL
Cholesterol: 133 mg/dL (ref 0–200)
HDL: 61.1 mg/dL (ref >39.00)
LDL Cholesterol: 53 mg/dL (ref 0–99)
NonHDL: 71.83
Total CHOL/HDL Ratio: 2
Triglycerides: 94 mg/dL (ref 0.0–149.0)
VLDL: 18.8 mg/dL (ref 0.0–40.0)

## 2020-07-05 LAB — CBC WITH DIFFERENTIAL/PLATELET
Basophils Absolute: 0.1 10*3/uL (ref 0.0–0.1)
Basophils Relative: 0.9 % (ref 0.0–3.0)
Eosinophils Absolute: 0 10*3/uL (ref 0.0–0.7)
Eosinophils Relative: 0.5 % (ref 0.0–5.0)
HCT: 40 % (ref 36.0–46.0)
Hemoglobin: 13.4 g/dL (ref 12.0–15.0)
Lymphocytes Relative: 23.2 % (ref 12.0–46.0)
Lymphs Abs: 1.3 10*3/uL (ref 0.7–4.0)
MCHC: 33.6 g/dL (ref 30.0–36.0)
MCV: 86.2 fl (ref 78.0–100.0)
Monocytes Absolute: 0.3 10*3/uL (ref 0.1–1.0)
Monocytes Relative: 5.8 % (ref 3.0–12.0)
Neutro Abs: 4 10*3/uL (ref 1.4–7.7)
Neutrophils Relative %: 69.6 % (ref 43.0–77.0)
Platelets: 217 10*3/uL (ref 150.0–400.0)
RBC: 4.64 Mil/uL (ref 3.87–5.11)
RDW: 14.5 % (ref 11.5–15.5)
WBC: 5.8 10*3/uL (ref 4.0–10.5)

## 2020-07-05 LAB — MICROALBUMIN / CREATININE URINE RATIO
Creatinine,U: 115.2 mg/dL
Microalb Creat Ratio: 0.6 mg/g (ref 0.0–30.0)
Microalb, Ur: <0.7 mg/dL (ref 0.0–1.9)

## 2020-07-05 LAB — TSH: TSH: 0.92 u[IU]/mL (ref 0.35–4.50)

## 2020-07-05 LAB — BASIC METABOLIC PANEL
BUN: 14 mg/dL (ref 6–23)
CO2: 27 mEq/L (ref 19–32)
Calcium: 9.8 mg/dL (ref 8.4–10.5)
Chloride: 103 mEq/L (ref 96–112)
Creatinine, Ser: 1.03 mg/dL (ref 0.40–1.20)
GFR: 53.22 mL/min — ABNORMAL LOW (ref >60.00)
Glucose, Bld: 71 mg/dL (ref 70–99)
Potassium: 3.3 mEq/L — ABNORMAL LOW (ref 3.5–5.1)
Sodium: 138 mEq/L (ref 135–145)

## 2020-07-05 MED ORDER — POTASSIUM CHLORIDE ER 10 MEQ PO TBCR
10.0000 meq | EXTENDED_RELEASE_TABLET | Freq: Every day | ORAL | 3 refills | Status: DC
Start: 2020-07-05 — End: 2021-07-29

## 2020-07-05 NOTE — Progress Notes (Signed)
Established Patient Office Visit  Subjective:  Patient ID: Daisy Dillon, female    DOB: 13-Jan-1946  Age: 75 y.o. MRN: [id number]       Chief Complaint:: wellness exam and Follow-up  DM and BP       HPI:  Daisy Dillon is a 75 y.o. female here for wellness exam; overall doing well.  Pt denies chest pain, increased sob or doe, wheezing, orthopnea, PND, increased LE swelling, palpitations, dizziness or syncope.  Pt denies new neurological symptoms such as new headache, or facial or extremity weakness or numbness   Pt denies polydipsia, polyuria,  Wt Readings from Last 3 Encounters:  07/05/20 193 lb (87.5 kg)  05/09/20 193 lb (87.5 kg)  03/26/20 198 lb 3.2 oz (89.9 kg)   BP Readings from Last 3 Encounters:  07/05/20 (!) 142/82  05/09/20 130/80  03/26/20 130/68   Immunization History  Administered Date(s) Administered  . Fluad Quad(high Dose 65+) 04/21/2019, 03/26/2020  . Influenza Split 04/10/2011  . Influenza,inj,Quad PF,6+ Mos 04/12/2012, 04/13/2013, 04/17/2014, 04/19/2015, 04/22/2016, 04/24/2017, 06/10/2018  . PFIZER(Purple Top)SARS-COV-2 Vaccination 08/09/2019, 08/29/2019, 04/23/2020  . Pneumococcal Conjugate-13 04/26/2013  . Pneumococcal Polysaccharide-23 05/08/2015  . Tdap 04/26/2013   Health Maintenance Due  Topic Date Due  . PAP SMEAR-Modifier  04/25/2019         Past Medical History:  Diagnosis Date  . Anemia    long ago  . Arthritis   . Diabetes mellitus   . Diverticulosis of colon (without mention of hemorrhage)   . Elevated cholesterol   . Fibroid   . Gastritis   . GERD (gastroesophageal reflux disease)   . Hiatal hernia   . Hypertension   . IBS (irritable bowel syndrome)   . Positive ANA (antinuclear antibody) 04/23/2012  . Stricture and stenosis of esophagus 09/03/2005  . Urinary incontinence    Past Surgical History:  Procedure Laterality Date  . ABDOMINAL HYSTERECTOMY  1985  . BACK SURGERY  11/2016  . CHOLECYSTECTOMY    .  COLONOSCOPY  12/15/2007   diverticulosis  . ESOPHAGOGASTRODUODENOSCOPY  12/24/2012   normal   . KNEE SURGERY Right    arthroscopic  . UPPER GASTROINTESTINAL ENDOSCOPY      reports that she has never smoked. She has never used smokeless tobacco. She reports that she does not drink alcohol and does not use drugs. family history includes Bipolar disorder in her brother; Breast cancer in her maternal aunt; Diabetes in her brother and mother; Hypertension in her mother; Stroke in her brother and brother. Allergies  Allergen Reactions  . Codeine Phosphate     REACTION: unspecified, nausea, vomiting   Current Outpatient Medications on File Prior to Visit  Medication Sig Dispense Refill  . Alcohol Swabs (B-D SINGLE USE SWABS REGULAR) PADS Use as instructed. 100 each 1  . Ascorbic Acid (VITAMIN C) 1000 MG tablet Take 1,000 mg by mouth daily.    Marland Kitchen aspirin 81 MG tablet Take 81 mg by mouth daily.    . bisoprolol-hydrochlorothiazide (ZIAC) 2.5-6.25 MG tablet TAKE 1 TABLET BY MOUTH  DAILY 90 tablet 3  . Blood Glucose Monitoring Suppl ([place]) w/Device KIT Use as instructed to check blood sugar once daily. DX:E11.9 1 kit 0  . Cholecalciferol (VITAMIN D3) 1000 UNITS CAPS Take 1 capsule by mouth daily.    Marland Kitchen dexlansoprazole (DEXILANT) 60 MG capsule Take 1 capsule (60 mg total) by mouth daily. 90 capsule 3  . dicyclomine (BENTYL) 20 MG tablet TAKE 1  TABLET BY MOUTH 4  TIMES DAILY BEFORE MEALS  AND AT BEDTIME 360 tablet 0  . fexofenadine (ALLEGRA) 180 MG tablet Take 1 tablet (180 mg total) by mouth daily. (Patient taking differently: Take 180 mg by mouth daily. As needed) 30 tablet 2  . losartan (COZAAR) 50 MG tablet TAKE 1 TABLET BY MOUTH  DAILY 90 tablet 3  . metFORMIN (GLUCOPHAGE) 500 MG tablet TAKE 1 TABLET BY MOUTH  TWICE DAILY WITH MEALS 180 tablet 1  . Multiple Vitamin (MULTIVITAMIN) tablet Take 1 tablet by mouth daily.    . pioglitazone (ACTOS) 15 MG tablet TAKE 1 TABLET BY MOUTH   DAILY 90 tablet 3  . pravastatin (PRAVACHOL) 20 MG tablet TAKE 1 TABLET BY MOUTH  DAILY 90 tablet 3  . verapamil (CALAN-SR) 240 MG CR tablet Take 1 tablet (240 mg total) by mouth at bedtime. 7 tablet 0   No current facility-administered medications on file prior to visit.        ROS:  All others reviewed and negative.  Objective        PE:  BP (!) 142/82   Pulse 75   Temp 98.4 F (36.9 C) (Oral)   Ht $R'5\' 5"'WS$  (1.651 m)   Wt 193 lb (87.5 kg)   SpO2 99%   BMI 32.12 kg/m                 Constitutional: Pt appears in NAD               HENT: Head: NCAT.                Right Ear: External ear normal.                 Left Ear: External ear normal.                Eyes: . Pupils are equal, round, and reactive to light. Conjunctivae and EOM are normal               Nose: without d/c or deformity               Neck: Neck supple. Gross normal ROM               Cardiovascular: Normal rate and regular rhythm.                 Pulmonary/Chest: Effort normal and breath sounds without rales or wheezing.                Abd:  Soft, NT, ND, + BS, no organomegaly               Neurological: Pt is alert. At baseline orientation, motor grossly intact               Skin: Skin is warm. No rashes, no other new lesions, LE edema - none               Psychiatric: Pt behavior is normal without agitation   Assessment/Plan:  Daisy Dillon is a 75 y.o. Black or African American [2] female with  has a past medical history of Anemia, Arthritis, Diabetes mellitus, Diverticulosis of colon (without mention of hemorrhage), Elevated cholesterol, Fibroid, Gastritis, GERD (gastroesophageal reflux disease), Hiatal hernia, Hypertension, IBS (irritable bowel syndrome), Positive ANA (antinuclear antibody) (04/23/2012), Stricture and stenosis of esophagus (09/03/2005), and Urinary incontinence.  Micro: none  Cardiac tracings I have personally interpreted today:  none  Pertinent Radiological findings (  summarize): none    Lab Results  Component Value Date   WBC 5.8 07/05/2020   HGB 13.4 07/05/2020   HCT 40.0 07/05/2020   PLT 217.0 07/05/2020   GLUCOSE 71 07/05/2020   CHOL 133 07/05/2020   TRIG 94.0 07/05/2020   HDL 61.10 07/05/2020   LDLCALC 53 07/05/2020   ALT 26 07/05/2020   AST 22 07/05/2020   NA 138 07/05/2020   K 3.3 (L) 07/05/2020   CL 103 07/05/2020   CREATININE 1.03 07/05/2020   BUN 14 07/05/2020   CO2 27 07/05/2020   TSH 0.92 07/05/2020   INR 1.12 02/07/2018   HGBA1C 5.8 07/05/2020   MICROALBUR <0.7 07/05/2020     Assessment & Plan:   Problem List Items Addressed This Visit      High   Preventative health care - Primary    Age and sex appropriate education and counseling updated with regular exercise and diet Referrals for preventative services - none needed Immunizations addressed - none needed Smoking counseling  - none needed Evidence for depression or other mood disorder - none significant Most recent labs reviewed. I have personally reviewed and have noted: 1) the patient's medical and social history 2) The patient's current medications and supplements 3) The patient's height, weight, and BMI have been recorded in the chart         Medium   Hypokalemia    curently not taking her K rx - for f/u lab today      Hypertension    Pt states BP controlled at home, declines any change in tx today, to f/u BP at home daily x 10 day and call with average      Diabetes [facility])    Lab Results  Component Value Date   HGBA1C 5.8 07/05/2020   Stable, pt to continue current medical treatment metformin, actos       Relevant Orders   Microalbumin / creatinine urine ratio (Completed)   Hemoglobin A1c (Completed)   Lipid panel (Completed)   Hepatic function panel (Completed)   CBC with Differential/Platelet (Completed)   TSH (Completed)   Urinalysis, Routine w reflex microscopic (Completed)   Basic metabolic panel (Completed)      Meds ordered this encounter   Medications  . potassium chloride (KLOR-CON) 10 MEQ tablet    Sig: Take 1 tablet (10 mEq total) by mouth daily.    Dispense:  90 tablet    Refill:  3    Follow-up: Return in about 6 months (around 01/02/2021).    Cathlean Cower, MD

## 2020-07-05 NOTE — Assessment & Plan Note (Signed)
curently not taking her K rx - for f/u lab today

## 2020-07-05 NOTE — Patient Instructions (Signed)

## 2020-07-08 ENCOUNTER — Other Ambulatory Visit: Payer: Self-pay | Admitting: Endocrinology

## 2020-07-10 ENCOUNTER — Other Ambulatory Visit: Payer: Self-pay

## 2020-07-10 ENCOUNTER — Ambulatory Visit: Payer: Medicare Other | Admitting: Orthotics

## 2020-07-10 DIAGNOSIS — M216X1 Other acquired deformities of right foot: Secondary | ICD-10-CM

## 2020-07-10 DIAGNOSIS — M2141 Flat foot [pes planus] (acquired), right foot: Secondary | ICD-10-CM

## 2020-07-10 DIAGNOSIS — M722 Plantar fascial fibromatosis: Secondary | ICD-10-CM

## 2020-07-10 DIAGNOSIS — M76821 Posterior tibial tendinitis, right leg: Secondary | ICD-10-CM

## 2020-07-10 DIAGNOSIS — M62461 Contracture of muscle, right lower leg: Secondary | ICD-10-CM

## 2020-07-10 DIAGNOSIS — M21861 Other specified acquired deformities of right lower leg: Secondary | ICD-10-CM

## 2020-07-10 NOTE — Progress Notes (Signed)
Cast today (SCAN) for RIchy type of slit upright thermoplastic brace.  This to address PTTD/Pes planovalgus RIGHT.  She tolerated casting well and she is flexible to get back to ST neurtral positon.

## 2020-07-15 ENCOUNTER — Encounter: Payer: Self-pay | Admitting: Internal Medicine

## 2020-07-15 NOTE — Assessment & Plan Note (Signed)

## 2020-07-15 NOTE — Assessment & Plan Note (Signed)
Pt states BP controlled at home, declines any change in tx today, to f/u BP at home daily x 10 day and call with average

## 2020-07-15 NOTE — Assessment & Plan Note (Signed)
Lab Results  Component Value Date   HGBA1C 5.8 07/05/2020   Stable, pt to continue current medical treatment metformin, actos

## 2020-07-25 NOTE — Telephone Encounter (Signed)
Patient states she called for the patient assistance forms for Dexilant be mailed her to her and she has not received them yet. Patient states she only has a few more capsules of Dexilant left and was wondering if we can give her some samples. Informed patient we have 1 box (4 capsules) of Dexilant samples left and we do not expect to get any more samples since Dexilant has went generic. Patient states she will pick up the sample. Also, informed patient to contact our office after she has received the forms and is ready for [place] to complete our part of the forms. Patient verbalized understanding.

## 2020-07-25 NOTE — Telephone Encounter (Signed)
Inbound call from patient requesting a call back from the nurse please in regards to application for assistance program for Dexilant medication.

## 2020-07-27 NOTE — Telephone Encounter (Signed)
Received fax from [facility] patient assistance for patient. Informed patient that she needs to come by and pick up her part of the forms to fill out and I will work on our part of the forms. Patient states she will bring the forms back next week.

## 2020-07-31 NOTE — Telephone Encounter (Signed)
Faxed patient assistance forms to [facility] for patient's Dexilant. Waiting on response.

## 2020-08-01 NOTE — Telephone Encounter (Signed)
Received fax from [employer] patient assistance program that the patient has been approved for prescription drug assistance through 06/15/2021.

## 2020-08-03 NOTE — Telephone Encounter (Signed)
Patient calling to follow up on forms

## 2020-08-03 NOTE — Telephone Encounter (Signed)
Patient states she received her Dexilant in the mail today. I informed patient we just got the approval 2 days ago so that was a fast shipment. Patient agreed.

## 2020-08-08 ENCOUNTER — Other Ambulatory Visit: Payer: Self-pay

## 2020-08-08 ENCOUNTER — Other Ambulatory Visit: Payer: Medicare Other | Admitting: Orthotics

## 2020-08-08 DIAGNOSIS — M216X1 Other acquired deformities of right foot: Secondary | ICD-10-CM

## 2020-08-08 DIAGNOSIS — M76821 Posterior tibial tendinitis, right leg: Secondary | ICD-10-CM

## 2020-08-23 ENCOUNTER — Other Ambulatory Visit: Payer: Self-pay

## 2020-08-23 ENCOUNTER — Ambulatory Visit: Payer: Medicare Other | Admitting: Podiatry

## 2020-08-23 DIAGNOSIS — M76821 Posterior tibial tendinitis, right leg: Secondary | ICD-10-CM

## 2020-08-23 DIAGNOSIS — M2142 Flat foot [pes planus] (acquired), left foot: Secondary | ICD-10-CM | POA: Diagnosis not present

## 2020-08-23 DIAGNOSIS — M21861 Other specified acquired deformities of right lower leg: Secondary | ICD-10-CM

## 2020-08-23 DIAGNOSIS — M722 Plantar fascial fibromatosis: Secondary | ICD-10-CM | POA: Diagnosis not present

## 2020-08-23 DIAGNOSIS — M2141 Flat foot [pes planus] (acquired), right foot: Secondary | ICD-10-CM

## 2020-08-23 DIAGNOSIS — M216X1 Other acquired deformities of right foot: Secondary | ICD-10-CM | POA: Diagnosis not present

## 2020-08-24 ENCOUNTER — Encounter: Payer: Self-pay | Admitting: Podiatry

## 2020-08-24 NOTE — Progress Notes (Signed)
  Subjective:  Patient ID: Daisy Dillon, female    DOB: August 18, 1945,  MRN: [id number]  Still has some burning sensation and pain with swelling  75 y.o. female returns with the above complaint. History confirmed with patient.  She is doing okay.  Still wearing the Tri-Lock ASO.  She did get the Richie brace but is not comfortable for her.  Objective:  Physical Exam: warm, good capillary refill, no trophic changes or ulcerative lesions, normal DP and PT pulses and normal sensory exam.  Right Foot: There is pain along the posterior tibial tendon has improved significantly since last visit  Radiographs: X-ray of left foot: Mild pes planus deformity with hallux valgus and met primus varus present  Study Result  Narrative & Impression  CLINICAL DATA:  Right ankle pain, swelling and tenderness for 1 month. Burning in the right foot.  EXAM: MRI OF THE RIGHT ANKLE WITHOUT CONTRAST  TECHNIQUE: Multiplanar, multisequence MR imaging of the ankle was performed. No intravenous contrast was administered.  COMPARISON:  Plain films right foot 05/18/2020.  FINDINGS: TENDONS  Peroneal: Intact.  Posteromedial: Intact.  Anterior: Intact.  Achilles: Intact.  Plantar Fascia: Intact.  No evidence of plantar fasciitis.  LIGAMENTS  Lateral: Intact.  Medial: Intact.  CARTILAGE  Ankle Joint: Normal. No osteochondral lesion of the talar dome or joint effusion.  Subtalar Joints/Sinus Tarsi: Normal.  Bones: Normal.  No fracture, stress change or worrisome lesion.  Other: Mild subcutaneous edema about the ankle.  IMPRESSION: Mild subcutaneous edema about the ankle is likely due to dependent change. The exam is otherwise negative. Tendons and ligaments are intact.   Electronically Signed   By: Inge Rise M.D.   On: 06/02/2020 09:39     Assessment:   1. Posterior tibial tendinitis of right lower extremity   2. Pes planus of both feet   3.  Gastrocnemius equinus of right lower extremity   4. Plantar fasciitis      Plan:  Patient was evaluated and treated and all questions answered.   Appears to be stable at this point.  She did get the Richie brace but she does not tolerate it and it did not fit in any of her shoes.  We discussed other bracing options including custom molded foot orthoses, a UCBL type brace and an [place] mezzo brace.  I think UCBL type brace would be the best thing for him this would be the most generalized will to fit in her other shoes.  Should be casted for this.  Like to see her back in 3 months and reevaluate  Return in about 3 months (around 11/23/2020).

## 2020-09-29 ENCOUNTER — Other Ambulatory Visit: Payer: Self-pay | Admitting: Gastroenterology

## 2020-10-08 ENCOUNTER — Telehealth: Payer: Self-pay | Admitting: Gastroenterology

## 2020-10-08 MED ORDER — DICYCLOMINE HCL 20 MG PO TABS: ORAL_TABLET | ORAL | 0 refills | Status: DC

## 2020-10-08 NOTE — Telephone Encounter (Signed)
Prescription refill sent to patient's pharmacy and informed to keep appt for further refills.

## 2020-10-09 ENCOUNTER — Encounter: Payer: Self-pay | Admitting: Obstetrics & Gynecology

## 2020-10-09 ENCOUNTER — Other Ambulatory Visit: Payer: Self-pay

## 2020-10-09 ENCOUNTER — Ambulatory Visit (INDEPENDENT_AMBULATORY_CARE_PROVIDER_SITE_OTHER): Payer: Medicare Other | Admitting: Obstetrics & Gynecology

## 2020-10-09 VITALS — BP 136/84 | Ht 64.5 in | Wt 192.2 lb

## 2020-10-09 DIAGNOSIS — N393 Stress incontinence (female) (male): Secondary | ICD-10-CM

## 2020-10-09 DIAGNOSIS — E6609 Other obesity due to excess calories: Secondary | ICD-10-CM

## 2020-10-09 DIAGNOSIS — Z90710 Acquired absence of both cervix and uterus: Secondary | ICD-10-CM

## 2020-10-09 DIAGNOSIS — Z6832 Body mass index (BMI) 32.0-32.9, adult: Secondary | ICD-10-CM

## 2020-10-09 DIAGNOSIS — Z01419 Encounter for gynecological examination (general) (routine) without abnormal findings: Secondary | ICD-10-CM

## 2020-10-09 DIAGNOSIS — Z780 Asymptomatic menopausal state: Secondary | ICD-10-CM | POA: Diagnosis not present

## 2020-10-09 MED ORDER — DICYCLOMINE HCL 20 MG PO TABS: ORAL_TABLET | ORAL | 3 refills | Status: DC

## 2020-10-09 NOTE — Progress Notes (Signed)
Daisy Dillon 02/19/1946 [id number]   History:    75 y.o. G2P2L2 Has many grand-children  [id number] presenting for annual gyn exam   HPI:S/P Total Hysterectomy.Postmenopausal, well on no hormone replacement therapy. No pelvic pain. Abstinent.  C/O SUI with sneezing and coughing.  Rare stool incontinence. Doing Kegels on-off. Breast normal. Body mass index 32.48. Limited physical activity. Health labs with family physician. Followed by endocrinology for diabetes mellitus type 2 on metformin.  Past medical history,surgical history, family history and social history were all reviewed and documented in the EPIC chart.  Gynecologic History No LMP recorded. Patient has had a hysterectomy.  Obstetric History OB History  Gravida Para Term Preterm AB Living  2 2 2     2   SAB IAB Ectopic Multiple Live Births               # Outcome Date GA Lbr Len/2nd Weight Sex Delivery Anes PTL Lv  2 Term           1 Term              ROS: A ROS was performed and pertinent positives and negatives are included in the history.  GENERAL: No fevers or chills. HEENT: No change in vision, no earache, sore throat or sinus congestion. NECK: No pain or stiffness. CARDIOVASCULAR: No chest pain or pressure. No palpitations. PULMONARY: No shortness of breath, cough or wheeze. GASTROINTESTINAL: No abdominal pain, nausea, vomiting or diarrhea, melena or bright red blood per rectum. GENITOURINARY: No urinary frequency, urgency, hesitancy or dysuria. MUSCULOSKELETAL: No joint or muscle pain, no back pain, no recent trauma. DERMATOLOGIC: No rash, no itching, no lesions. ENDOCRINE: No polyuria, polydipsia, no heat or cold intolerance. No recent change in weight. HEMATOLOGICAL: No anemia or easy bruising or bleeding. NEUROLOGIC: No headache, seizures, numbness, tingling or weakness. PSYCHIATRIC: No depression, no loss of interest in normal activity or change in sleep pattern.      Exam:   BP 136/84   Ht 5' 4.5" (1.638 m)   Wt 192 lb 3.2 oz (87.2 kg)   BMI 32.48 kg/m   Body mass index is 32.48 kg/m.  General appearance : Well developed well nourished female. No acute distress HEENT: Eyes: no retinal hemorrhage or exudates,  Neck supple, trachea midline, no carotid bruits, no thyroidmegaly Lungs: Clear to auscultation, no rhonchi or wheezes, or rib retractions  Heart: Regular rate and rhythm, no murmurs or gallops Breast:Examined in sitting and supine position were symmetrical in appearance, no palpable masses or tenderness,  no skin retraction, no nipple inversion, no nipple discharge, no skin discoloration, no axillary or supraclavicular lymphadenopathy Abdomen: no palpable masses or tenderness, no rebound or guarding Extremities: no edema or skin discoloration or tenderness  Pelvic: Vulva: Normal             Vagina: No gross lesions or discharge  Cervix/Uterus absent  Adnexa  Without masses or tenderness  Anus: Normal   Assessment/Plan:  75 y.o. female for annual exam   1. Well female exam with routine gynecological exam Gynecologic exam s/p total hysterectomy in menopause.  No indication for a pap test at this point.  Breasts normal.  Screening mammo 04/2020 Negative.  [place] 2019. Health labs with Fam MD.  2. H/O total hysterectomy  3. Postmenopausal Well on no HRT. BD normal 2016.  4. SUI (stress urinary incontinence, female) Kegels instructed.  Avoid overfilling the bladder.  Work on nutrition to have stools just the right firmness.  Refer to PT for Pelvic Floor Reinforcement.  5. Class 1 obesity due to excess calories with serious comorbidity and body mass index (BMI) of 32.0 to 32.9 in adult Decrease calories/carbs.  Aerobic activities 5 times a week with light weight lifting every 2 days.  Princess Bruins MD, 2:43 PM 10/09/2020

## 2020-10-09 NOTE — Telephone Encounter (Signed)
Prescription sent to [facility] and informed patient to keep appt for further refills.

## 2020-10-09 NOTE — Addendum Note (Signed)
Addended by: Dorisann Frames L on: 10/09/2020 10:38 AM   Modules accepted: Orders

## 2020-10-09 NOTE — Telephone Encounter (Signed)
Patient called states she gets her medication in the mail through [place] but they sent the RF to [facility]

## 2020-10-10 ENCOUNTER — Telehealth: Payer: Self-pay | Admitting: *Deleted

## 2020-10-10 DIAGNOSIS — N393 Stress incontinence (female) (male): Secondary | ICD-10-CM

## 2020-10-10 NOTE — Telephone Encounter (Signed)
Referral placed at [facility] they will call to schedule.  °

## 2020-10-10 NOTE — Telephone Encounter (Signed)
-----   Message from Princess Bruins, MD sent at 10/09/2020  3:08 PM EDT ----- Regarding: Refer to Physical Therapy for Pelvic Floor Reinforcement SUI with coughing and sneezing.  Occasional stool incontinence.

## 2020-10-11 ENCOUNTER — Other Ambulatory Visit: Payer: Self-pay

## 2020-10-11 ENCOUNTER — Other Ambulatory Visit (INDEPENDENT_AMBULATORY_CARE_PROVIDER_SITE_OTHER): Payer: Medicare Other

## 2020-10-11 DIAGNOSIS — E669 Obesity, unspecified: Secondary | ICD-10-CM | POA: Diagnosis not present

## 2020-10-11 DIAGNOSIS — E1169 Type 2 diabetes mellitus with other specified complication: Secondary | ICD-10-CM | POA: Diagnosis not present

## 2020-10-11 DIAGNOSIS — E7800 Pure hypercholesterolemia, unspecified: Secondary | ICD-10-CM

## 2020-10-11 LAB — COMPREHENSIVE METABOLIC PANEL
ALT: 28 U/L (ref 0–35)
AST: 25 U/L (ref 0–37)
Albumin: 4 g/dL (ref 3.5–5.2)
Alkaline Phosphatase: 100 U/L (ref 39–117)
BUN: 20 mg/dL (ref 6–23)
CO2: 25 mEq/L (ref 19–32)
Calcium: 9.5 mg/dL (ref 8.4–10.5)
Chloride: 106 mEq/L (ref 96–112)
Creatinine, Ser: 1.09 mg/dL (ref 0.40–1.20)
GFR: 49.63 mL/min — ABNORMAL LOW (ref >60.00)
Glucose, Bld: 91 mg/dL (ref 70–99)
Potassium: 3.6 mEq/L (ref 3.5–5.1)
Sodium: 140 mEq/L (ref 135–145)
Total Bilirubin: 0.3 mg/dL (ref 0.2–1.2)
Total Protein: 7.5 g/dL (ref 6.0–8.3)

## 2020-10-11 LAB — LIPID PANEL
Cholesterol: 124 mg/dL (ref 0–200)
HDL: 54.2 mg/dL (ref >39.00)
LDL Cholesterol: 53 mg/dL (ref 0–99)
NonHDL: 69.55
Total CHOL/HDL Ratio: 2
Triglycerides: 83 mg/dL (ref 0.0–149.0)
VLDL: 16.6 mg/dL (ref 0.0–40.0)

## 2020-10-11 LAB — HEMOGLOBIN A1C: Hgb A1c MFr Bld: 5.7 % (ref 4.6–6.5)

## 2020-10-12 ENCOUNTER — Encounter: Payer: Self-pay | Admitting: Obstetrics & Gynecology

## 2020-10-12 ENCOUNTER — Ambulatory Visit: Payer: Medicare Other

## 2020-10-12 DIAGNOSIS — M76821 Posterior tibial tendinitis, right leg: Secondary | ICD-10-CM

## 2020-10-18 ENCOUNTER — Ambulatory Visit: Payer: Medicare Other | Admitting: Endocrinology

## 2020-10-18 ENCOUNTER — Encounter: Payer: Self-pay | Admitting: Endocrinology

## 2020-10-18 ENCOUNTER — Other Ambulatory Visit: Payer: Self-pay

## 2020-10-18 VITALS — BP 134/82 | HR 84 | Ht 64.5 in | Wt 191.4 lb

## 2020-10-18 DIAGNOSIS — E119 Type 2 diabetes mellitus without complications: Secondary | ICD-10-CM | POA: Diagnosis not present

## 2020-10-18 DIAGNOSIS — I10 Essential (primary) hypertension: Secondary | ICD-10-CM | POA: Diagnosis not present

## 2020-10-18 DIAGNOSIS — Z6833 Body mass index (BMI) 33.0-33.9, adult: Secondary | ICD-10-CM | POA: Diagnosis not present

## 2020-10-18 DIAGNOSIS — E7800 Pure hypercholesterolemia, unspecified: Secondary | ICD-10-CM

## 2020-10-18 NOTE — Patient Instructions (Signed)
Take 1 Metforin and 1 actos at dinner, also Verapramil

## 2020-10-18 NOTE — Progress Notes (Signed)
Patient ID: Daisy Dillon, female   DOB: Dec 24, 1945, 75 y.o.   MRN: [id number]   Reason for Appointment: follow-up   History of Present Illness   Diagnosis: Type 2 DIABETES MELITUS, date of diagnosis:   2002   She has had stable long-term control of her mild diabetes with combination of low-doses of Actos and metformin  She usually has had a normal A1c and this stays in the 5.7- 6% range  A1c is consistently below 6% and now 5.7 compared to 5.8  Current management, blood sugar patterns and history:   She is back for her annual follow-up  She does sporadic blood sugar monitoring before and after different meals and these are consistently normal  She usually does not check her morning readings until at least 10 AM  Weight has recently been about the same this year  Lab glucose 91 fasting  No leg swelling with Actos  Has not been forgetting to take any of her medications also  As before because of her back pain cannot do much walking or other exercises  Side effects from medications: None  Monitors blood glucose: Less than once a day .    Glucometer: One touch     Blood Glucose readings and averages from download:  RANGE 76-120 for the last 30 days MEDIAN 94  Previously:  PRE-MEAL Fasting Lunch Dinner Bedtime Overall  Glucose range:  88, 101   91-106  96-108   Mean/median:      105   POST-MEAL PC Breakfast PC Lunch PC Dinner  Glucose range:  71-122  145, 191   Mean/median:         Wt Readings from Last 3 Encounters:  10/18/20 191 lb 6 oz (86.8 kg)  10/09/20 192 lb 3.2 oz (87.2 kg)  07/05/20 193 lb (87.5 kg)    Lab Results  Component Value Date   HGBA1C 5.7 10/11/2020   HGBA1C 5.8 07/05/2020   HGBA1C 5.7 12/26/2019   Lab Results  Component Value Date   MICROALBUR <0.7 07/05/2020   LDLCALC 53 10/11/2020   CREATININE 1.09 10/11/2020    OTHER active problems: See review of systems   No visits with results within 1 Week(s) from  this visit.  Latest known visit with results is:  Lab on 10/11/2020  Component Date Value Ref Range Status  . Cholesterol 10/11/2020 124  0 - 200 mg/dL Final   ATP III Classification       Desirable:  < 200 mg/dL               Borderline High:  200 - 239 mg/dL          High:  > = 240 mg/dL  . Triglycerides 10/11/2020 83.0  0.0 - 149.0 mg/dL Final   Normal:  <150 mg/dLBorderline High:  150 - 199 mg/dL  . HDL 10/11/2020 54.20  >39.00 mg/dL Final  . VLDL 10/11/2020 16.6  0.0 - 40.0 mg/dL Final  . LDL Cholesterol 10/11/2020 53  0 - 99 mg/dL Final  . Total CHOL/HDL Ratio 10/11/2020 2   Final                  Men          Women1/2 Average Risk     3.4          3.3Average Risk          5.0          4.42X Average Risk  9.6          7.13X Average Risk          15.0          11.0                      . NonHDL 10/11/2020 69.55   Final   NOTE:  Non-HDL goal should be 30 mg/dL higher than patient's LDL goal (i.e. LDL goal of < 70 mg/dL, would have non-HDL goal of < 100 mg/dL)  . Sodium 10/11/2020 140  135 - 145 mEq/L Final  . Potassium 10/11/2020 3.6  3.5 - 5.1 mEq/L Final  . Chloride 10/11/2020 106  96 - 112 mEq/L Final  . CO2 10/11/2020 25  19 - 32 mEq/L Final  . Glucose, Bld 10/11/2020 91  70 - 99 mg/dL Final  . BUN 10/11/2020 20  6 - 23 mg/dL Final  . Creatinine, Ser 10/11/2020 1.09  0.40 - 1.20 mg/dL Final  . Total Bilirubin 10/11/2020 0.3  0.2 - 1.2 mg/dL Final  . Alkaline Phosphatase 10/11/2020 100  39 - 117 U/L Final  . AST 10/11/2020 25  0 - 37 U/L Final  . ALT 10/11/2020 28  0 - 35 U/L Final  . Total Protein 10/11/2020 7.5  6.0 - 8.3 g/dL Final  . Albumin 10/11/2020 4.0  3.5 - 5.2 g/dL Final  . GFR 10/11/2020 49.63* >60.00 mL/min Final   Calculated using the CKD-EPI Creatinine Equation (2021)  . Calcium 10/11/2020 9.5  8.4 - 10.5 mg/dL Final  . Hgb A1c MFr Bld 10/11/2020 5.7  4.6 - 6.5 % Final   Glycemic Control Guidelines for People with Diabetes:Non Diabetic:  <6%Goal of  Therapy: <7%Additional Action Suggested:  >8%     Allergies as of 10/18/2020      Reactions   Codeine Phosphate    REACTION: unspecified, nausea, vomiting      Medication List       Accurate as of Oct 18, 2020 10:44 AM. If you have any questions, ask your nurse or doctor.        aspirin 81 MG tablet Take 81 mg by mouth daily.   B-D SINGLE USE SWABS REGULAR Pads Use as instructed.   bisoprolol-hydrochlorothiazide 2.5-6.25 MG tablet Commonly known as: ZIAC TAKE 1 TABLET BY MOUTH  DAILY   dexlansoprazole 60 MG capsule Commonly known as: Dexilant Take 1 capsule (60 mg total) by mouth daily.   dicyclomine 20 MG tablet Commonly known as: BENTYL TAKE 1 TABLET BY MOUTH 4  TIMES DAILY BEFORE MEALS  AND AT BEDTIME   fexofenadine 180 MG tablet Commonly known as: ALLEGRA Take 1 tablet (180 mg total) by mouth daily. What changed: additional instructions   losartan 50 MG tablet Commonly known as: COZAAR TAKE 1 TABLET BY MOUTH  DAILY   metFORMIN 500 MG tablet Commonly known as: GLUCOPHAGE TAKE 1 TABLET BY MOUTH  TWICE DAILY WITH MEALS   multivitamin tablet Take 1 tablet by mouth daily.   OneTouch Verio Flex System w/Device Kit Use as instructed to check blood sugar once daily. DX:E11.9   OneTouch Verio test strip Generic drug: glucose blood USE 1 STRIP TO CHECK GLUCOSE ONCE DAILY   pioglitazone 15 MG tablet Commonly known as: ACTOS TAKE 1 TABLET BY MOUTH  DAILY   potassium chloride 10 MEQ tablet Commonly known as: KLOR-CON Take 1 tablet (10 mEq total) by mouth daily.   pravastatin 20 MG tablet Commonly known as: PRAVACHOL TAKE  1 TABLET BY MOUTH  DAILY   verapamil 240 MG CR tablet Commonly known as: CALAN-SR Take 1 tablet (240 mg total) by mouth at bedtime.   vitamin C 1000 MG tablet Take 1,000 mg by mouth daily.   Vitamin D3 25 MCG (1000 UT) Caps Take 1 capsule by mouth daily.       Allergies:  Allergies  Allergen Reactions  . Codeine Phosphate      REACTION: unspecified, nausea, vomiting    Past Medical History:  Diagnosis Date  . Anemia    long ago  . Arthritis   . Diabetes mellitus   . Diverticulosis of colon (without mention of hemorrhage)   . Elevated cholesterol   . Fibroid   . Gastritis   . GERD (gastroesophageal reflux disease)   . Hiatal hernia   . Hypertension   . IBS (irritable bowel syndrome)   . Positive ANA (antinuclear antibody) 04/23/2012  . Stricture and stenosis of esophagus 09/03/2005  . Urinary incontinence     Past Surgical History:  Procedure Laterality Date  . ABDOMINAL HYSTERECTOMY  1985  . BACK SURGERY  11/2016  . CHOLECYSTECTOMY    . COLONOSCOPY  12/15/2007   diverticulosis  . ESOPHAGOGASTRODUODENOSCOPY  12/24/2012   normal   . KNEE SURGERY Right    arthroscopic  . UPPER GASTROINTESTINAL ENDOSCOPY      Family History  Problem Relation Age of Onset  . Diabetes Mother   . Hypertension Mother   . Stroke Brother        x 2  . Bipolar disorder Brother   . Stroke Brother   . Diabetes Brother   . Breast cancer Maternal Aunt        Age 8's  . Colon cancer Neg Hx   . Stomach cancer Neg Hx   . Throat cancer Neg Hx   . Liver disease Neg Hx   . Colon polyps Neg Hx   . Esophageal cancer Neg Hx   . Rectal cancer Neg Hx     Social History:  reports that she has never smoked. She has never used smokeless tobacco. She reports that she does not drink alcohol and does not use drugs.  Review of Systems:  HYPERTENSION:   She takes losartan 50 mg, Verapamil 240 mg and Ziac 2.5 Blood pressure is consistently controlled Her medication regimen has been continued unchanged She does take her verapamil at bedtime instead of with food  Home BP checked periodically  The blood pressure at home is recently 120/75, 111/79 using an arm cuff  BP Readings from Last 3 Encounters:  10/18/20 134/82  10/09/20 136/84  07/05/20 (!) 142/82   BMP results:  Lab Results  Component Value Date    CREATININE 1.09 10/11/2020   BUN 20 10/11/2020   NA 140 10/11/2020   K 3.6 10/11/2020   CL 106 10/11/2020   CO2 25 10/11/2020    HYPERLIPIDEMIA: She has had hypercholesterolemia, LDL is well-controlled on pravastatin 20 mg  Lab Results  Component Value Date   CHOL 124 10/11/2020   HDL 54.20 10/11/2020   LDLCALC 53 10/11/2020   TRIG 83.0 10/11/2020   CHOLHDL 2 10/11/2020     Last eye exam was 05/2019  Last foot exam 04/2020    Examination:   BP 134/82   Pulse 84   Ht 5' 4.5" (1.638 m)   Wt 191 lb 6 oz (86.8 kg)   SpO2 98%   BMI 32.34 kg/m   Body mass index  is 32.34 kg/m.   No ankle edema present  ASSESSMENT/ PLAN:   Diabetes type 2 with  obesity  Her A1c is consistently below 6% and now 5.7  She is on metformin 1000 mg a day and  15 mg Actos for several years Had no side effects with either medication  No recent weight gain despite her limitations with exercise  Has had no diabetes complications, duration of diabetes 20 years  Since her blood sugars are mostly near normal she can reduce her metformin to 1 tablet daily and take this with dinner along with Actos  Hypertension:  well controlled with bisoprolol/HCT 2.5 mg, verapamil and losartan 50 mg She will continue the same regimen However she needs to take her verapamil with food for more even absorption   Lipids: Well-controlled and she will continue pravastatin  Follow-up in 12 months    Raydon Chappuis 10/18/2020, 10:44 AM

## 2020-10-19 ENCOUNTER — Telehealth: Payer: Self-pay | Admitting: Endocrinology

## 2020-10-19 NOTE — Telephone Encounter (Signed)
Called pt informed her that the only change that Dr Dwyane Dee made was to take 1 metformin tablet daily with dinner along with Actos per his notes on last visit.

## 2020-10-19 NOTE — Telephone Encounter (Signed)
Patient called and is requesting a call to give her instructions on what time to take medicines .  I attempted to direct her to directions on bottle, medicine sheets from pharmacy, AVS and then attempted to advise of the directions on meds list here.  Patient states needs someone to tell what time, etc to take.  Call back # [phone]

## 2020-11-05 ENCOUNTER — Ambulatory Visit: Payer: Medicare Other | Admitting: Gastroenterology

## 2020-11-05 ENCOUNTER — Encounter: Payer: Self-pay | Admitting: Gastroenterology

## 2020-11-05 VITALS — BP 136/68 | HR 76 | Ht 64.5 in | Wt 192.5 lb

## 2020-11-05 DIAGNOSIS — K219 Gastro-esophageal reflux disease without esophagitis: Secondary | ICD-10-CM | POA: Diagnosis not present

## 2020-11-05 DIAGNOSIS — K580 Irritable bowel syndrome with diarrhea: Secondary | ICD-10-CM | POA: Diagnosis not present

## 2020-11-05 NOTE — Patient Instructions (Signed)
Continue current medications.  Call our office if you need further refills.  Thank you for choosing me and [facility].  Pricilla Riffle. Dagoberto Ligas., MD., Marval Regal

## 2020-11-05 NOTE — Progress Notes (Signed)
    History of Present Illness: This is a 75 year old female here for follow up of GERD, IBS.  Her symptoms are well controlled on her current medications.  She has no gastrointestinal complaints.  Colonoscopy 02/2018 - Two 5 to 6 mm polyps in the sigmoid colon, removed with a cold snare. Resected and retrieved. (hyperplastic) - Moderate diverticulosis in the left colon. There was no evidence of diverticular bleeding. - Internal hemorrhoids. - The examination was otherwise normal on direct and retroflexion views.  Current Medications, Allergies, Past Medical History, Past Surgical History, Family History and Social History were reviewed in [employer] record.   Physical Exam: General: Well developed, well nourished, no acute distress Head: Normocephalic and atraumatic Eyes: Sclerae anicteric, EOMI Ears: Normal auditory acuity Mouth: Not examined, mask on during Covid-19 pandemic Lungs: Clear throughout to auscultation Heart: Regular rate and rhythm; no murmurs, rubs or bruits Abdomen: Soft, non tender and non distended. No masses, hepatosplenomegaly or hernias noted. Normal Bowel sounds Rectal: Not done Musculoskeletal: Symmetrical with no gross deformities  Pulses:  Normal pulses noted Extremities: No clubbing, cyanosis, edema or deformities noted Neurological: Alert oriented x 4, grossly nonfocal Psychological:  Alert and cooperative. Normal mood and affect   Assessment and Recommendations:  1. GERD.  Follow antireflux measures.  Continue Dexilant 60 mg p.o. daily REV in 1 year.  2. IBS-D.  Continue dicyclomine 20 mg AC and HS. REV in 1 year.   3. CRC screening is up-to-date with recent colonoscopy above.  No future screening colonoscopies recommended due to age and absence of precancerous polyps on last colonoscopy.

## 2020-11-05 NOTE — Telephone Encounter (Signed)
Patient scheduled on 12/31/20

## 2020-11-07 ENCOUNTER — Other Ambulatory Visit: Payer: Self-pay | Admitting: Endocrinology

## 2020-11-26 NOTE — Progress Notes (Signed)
Patient seen in office by EJ for orthotic adjustments. Adjustments were made in the office at this time for a better fit.

## 2020-11-27 ENCOUNTER — Ambulatory Visit: Payer: Medicare Other | Admitting: Podiatry

## 2020-11-27 ENCOUNTER — Encounter: Payer: Self-pay | Admitting: Podiatry

## 2020-11-27 ENCOUNTER — Other Ambulatory Visit: Payer: Self-pay

## 2020-11-27 DIAGNOSIS — M76821 Posterior tibial tendinitis, right leg: Secondary | ICD-10-CM

## 2020-11-27 NOTE — Progress Notes (Signed)
  Subjective:  Patient ID: Daisy Dillon, female    DOB: Jul 11, 1945,  MRN: [id number]  Still has some burning sensation and pain with swelling  75 y.o. female returns with the above complaint. History confirmed with patient.  She is doing much better not having any more pain  Objective:  Physical Exam: warm, good capillary refill, no trophic changes or ulcerative lesions, normal DP and PT pulses and normal sensory exam.  Right Foot: There is pain along the posterior tibial tendon has improved significantly since last visit  Radiographs: X-ray of left foot: Mild pes planus deformity with hallux valgus and met primus varus present  Study Result  Narrative & Impression  CLINICAL DATA:  Right ankle pain, swelling and tenderness for 1 month. Burning in the right foot.   EXAM: MRI OF THE RIGHT ANKLE WITHOUT CONTRAST   TECHNIQUE: Multiplanar, multisequence MR imaging of the ankle was performed. No intravenous contrast was administered.   COMPARISON:  Plain films right foot 05/18/2020.   FINDINGS: TENDONS   Peroneal: Intact.   Posteromedial: Intact.   Anterior: Intact.   Achilles: Intact.   Plantar Fascia: Intact.  No evidence of plantar fasciitis.   LIGAMENTS   Lateral: Intact.   Medial: Intact.   CARTILAGE   Ankle Joint: Normal. No osteochondral lesion of the talar dome or joint effusion.   Subtalar Joints/Sinus Tarsi: Normal.   Bones: Normal.  No fracture, stress change or worrisome lesion.   Other: Mild subcutaneous edema about the ankle.   IMPRESSION: Mild subcutaneous edema about the ankle is likely due to dependent change. The exam is otherwise negative. Tendons and ligaments are intact.     Electronically Signed   By: Inge Rise M.D.   On: 06/02/2020 09:39      Assessment:   1. Posterior tibial tendinitis of right lower extremity      Plan:  Patient was evaluated and treated and all questions answered.   She is doing well  and is not having much pain at this point.  Return as needed  Return if symptoms worsen or fail to improve.

## 2020-11-29 ENCOUNTER — Telehealth: Payer: Self-pay | Admitting: Endocrinology

## 2020-11-29 NOTE — Telephone Encounter (Signed)
MEDICATION: pravastatin (PRAVACHOL) 20 MG tablet   PHARMACY:  [facility]  HAS THE PATIENT CONTACTED THEIR PHARMACY?  yes  IS THIS A 90 DAY SUPPLY : yes  IS PATIENT OUT OF MEDICATION: no  IF NOT; HOW MUCH IS LEFT: 7-10 days   LAST APPOINTMENT DATE: @5 /25/2022  NEXT APPOINTMENT DATE:@05 /23/2023  DO WE HAVE YOUR PERMISSION TO LEAVE A DETAILED MESSAGE?: yes

## 2020-11-30 ENCOUNTER — Other Ambulatory Visit: Payer: Self-pay

## 2020-11-30 DIAGNOSIS — E7800 Pure hypercholesterolemia, unspecified: Secondary | ICD-10-CM

## 2020-11-30 MED ORDER — PRAVASTATIN SODIUM 20 MG PO TABS: 20.0000 mg | ORAL_TABLET | Freq: Every day | ORAL | 3 refills | Status: DC

## 2020-11-30 NOTE — Telephone Encounter (Signed)
Rx was sent  

## 2020-12-31 ENCOUNTER — Ambulatory Visit: Payer: Medicare Other | Admitting: Physical Therapy

## 2021-01-16 ENCOUNTER — Other Ambulatory Visit: Payer: Self-pay | Admitting: Endocrinology

## 2021-02-14 ENCOUNTER — Encounter: Payer: Medicare Other | Attending: Obstetrics & Gynecology | Admitting: Physical Therapy

## 2021-02-14 ENCOUNTER — Encounter: Payer: Self-pay | Admitting: Physical Therapy

## 2021-02-14 ENCOUNTER — Other Ambulatory Visit: Payer: Self-pay

## 2021-02-14 DIAGNOSIS — M6281 Muscle weakness (generalized): Secondary | ICD-10-CM | POA: Insufficient documentation

## 2021-02-14 DIAGNOSIS — R278 Other lack of coordination: Secondary | ICD-10-CM | POA: Insufficient documentation

## 2021-02-14 DIAGNOSIS — R159 Full incontinence of feces: Secondary | ICD-10-CM | POA: Insufficient documentation

## 2021-02-14 DIAGNOSIS — N393 Stress incontinence (female) (male): Secondary | ICD-10-CM | POA: Insufficient documentation

## 2021-02-14 NOTE — Therapy (Signed)
[facility] at [facility] [address] [place], [place], [address] Phone: [phone]   Fax:  [phone]  Physical Therapy Evaluation  Patient Details  Name: Daisy Dillon MRN: [id number] Date of Birth: 09/17/1945 Referring Provider (PT): Dr. Princess Bruins   Encounter Date: 02/14/2021   PT End of Session - 02/14/21 1050     Visit Number 1    Date for PT Re-Evaluation 05/09/21    Authorization Type UHC medicare    PT Start Time 1030    PT Stop Time 1115    PT Time Calculation (min) 45 min    Activity Tolerance Patient tolerated treatment well    Behavior During Therapy [facility] for tasks assessed/performed             Past Medical History:  Diagnosis Date   Anemia    long ago   Arthritis    Diabetes mellitus    Diverticulosis of colon (without mention of hemorrhage)    Elevated cholesterol    Fibroid    Gastritis    GERD (gastroesophageal reflux disease)    Hiatal hernia    Hypertension    IBS (irritable bowel syndrome)    Positive ANA (antinuclear antibody) 04/23/2012   Stricture and stenosis of esophagus 09/03/2005   Urinary incontinence     Past Surgical History:  Procedure Laterality Date   ABDOMINAL HYSTERECTOMY  1985   BACK SURGERY  11/2016   CHOLECYSTECTOMY     COLONOSCOPY  12/15/2007   diverticulosis   ESOPHAGOGASTRODUODENOSCOPY  12/24/2012   normal    KNEE SURGERY Right    arthroscopic   UPPER GASTROINTESTINAL ENDOSCOPY      There were no vitals filed for this visit.    Subjective Assessment - 02/14/21 1036     Subjective Patient reports her incontinence started several months ago.    Patient Stated Goals learn how to control her urine    Currently in Pain? 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 Princess Bruins    Onset Date/Surgical Date --   several months      Precautions   Precautions None      Restrictions   Weight Bearing Restrictions No      Balance Screen   Has the patient fallen in the past 6 months No    Has the patient had a decrease in activity level because of a fear of falling?  No    Is the patient reluctant to leave their home because of a fear of falling?  No      Home Ecologist residence      Prior Function   Level of Independence Independent    Leisure no due to back paroblems      Cognition   Overall Cognitive Status Within Functional Limits for tasks assessed      Posture/Postural Control   Posture/Postural Control Postural limitations    Postural Limitations Flexed trunk;Forward head;Rounded Shoulders      ROM / Strength   AROM / PROM / Strength AROM;PROM;Strength      Strength   Overall Strength Comments Not able to tighten her abdomen    Right Hip Extension 4/5    Right Hip ABduction 4/5    Left Hip Extension 4/5  Left Hip ABduction 4/5             No emotional/communication barriers or cognitive limitation. Patient is motivated to learn. Patient understands and agrees with treatment goals and plan. PT explains patient will be examined in standing, sitting, and lying down to see how their muscles and joints work. When they are ready, they will be asked to remove their underwear so PT can examine their perineum. The patient is also given the option of providing their own chaperone as one is not provided in our facility. The patient also has the right and is explained the right to defer or refuse any part of the evaluation or treatment including the internal exam. With the patient's consent, PT will use one gloved finger to gently assess the muscles of the pelvic floor, seeing how well it contracts and relaxes and if there is muscle symmetry. After, the patient will get dressed and PT and patient will discuss exam findings and plan of care. PT and patient discuss plan of care,  schedule, attendance policy and HEP activities.            Objective measurements completed on examination: See above findings.     Pelvic Floor Special Questions - 02/14/21 0001     Prior Pregnancies Yes    Number of Vaginal Deliveries 2    Urinary Leakage Yes    Pad use wears 1 thin liner per day and at night wears the same one    Activities that cause leaking Coughing;Sneezing;With strong urge;Laughing    Urinary urgency No    Fecal incontinence Yes   she feel the stool come out and unable to hold it; happens now and then.has to wipe alot; type 4   Skin Integrity Intact    Pelvic Floor Internal Exam Patient confirms identification and approves PT to assess pelvic floor and treatment    Exam Type Vaginal    Palpation tightness in the sides of the introitus, when she laughs the pelvic floor will bulge out    Strength weak squeeze, no lift   after manual work on the sides of the introitus the contraction was a full circle and 3/5                     PT Education - 02/14/21 1117     Education Details Access Code: [facility]    Person(s) Educated Patient    Methods Explanation;Demonstration;Verbal cues;Handout    Comprehension Returned demonstration;Verbalized understanding              PT Short Term Goals - 02/14/21 1123       PT SHORT TERM GOAL #1   Title independent with intial HEP for pelvic floor strengthening    Time 4    Period Weeks    Status New    Target Date 03/14/21      PT SHORT TERM GOAL #2   Title hold pelvic floor contraction for 5 seconds in sitting due to increased strength and coordination    Time 4    Period Weeks    Status New    Target Date 03/14/21               PT Long Term Goals - 02/14/21 1124       PT LONG TERM GOAL #1   Title independent with advanced HEP for pelvic floor and core strength    Time 12    Period Weeks    Status New  Target Date 05/09/21      PT LONG TERM GOAL #2   Title able to cough,  laugh and sneeze without leaking urine due to increased strength and coordination of the pelvic floor    Time 12    Period Weeks    Status New    Target Date 05/09/21      PT LONG TERM GOAL #3   Title stool leakage decreased >/= 75% due to increased in pelvic floor strength    Time 12    Period Weeks    Status New    Target Date 05/09/21      PT LONG TERM GOAL #4   Title able to wipe 1-2 times due to reduction of stool leakage after a bowel movement    Time 12    Period Weeks    Status New    Target Date 05/09/21                    Plan - 02/14/21 1051     Clinical Impression Statement Patient is a 75 year old female with stress incontinence for the past several months. She will leak urine with coughing, laughing, and sneezing. Patient will on occasion leak stool and has to wipe frequently after a bowel movement. Patient wears 1 pad in 24 hour period. Pelvic floor strength is 2/5 but after manual work to the sides of the intoitus it went to 3/5. She can  hold her pelvic floor contraction for 3 seconds. She is able to do a quick flick but sometimes has difficulty fully relaxing. When she laughs she will push the therapist finger out. Patient walks and stands with a flexed trunk due ot past back surgery. Bilateral hip extension and abduction is 4/5. Patinet will benefit from skilled therapy to reduce urine and fecal leakage.    Personal Factors and Comorbidities Age;Comorbidity 3+    Comorbidities abdominal hysterectomy; back surgery; DM; IBS    Examination-Activity Limitations Continence;Toileting;Other   cough, laugh, sneeze   Examination-Participation Restrictions Community Activity    Stability/Clinical Decision Making Stable/Uncomplicated    Clinical Decision Making Low    Rehab Potential Excellent    PT Frequency 1x / week    PT Duration 12 weeks    PT Treatment/Interventions ADLs/Self Care Home Management;Biofeedback;Neuromuscular re-education;Therapeutic  activities;Therapeutic exercise;Patient/family education;Manual techniques    PT Next Visit Plan pelvic floor contraction for 7 sec supine, sitting 3 seconds, lower abdominal contraction, sitting and leaning forward contraction    PT Home Exercise Plan Access Code: [email or web address]    Consulted and Agree with Plan of Care Patient             Patient will benefit from skilled therapeutic intervention in order to improve the following deficits and impairments:  Decreased endurance, Decreased activity tolerance, Decreased strength, Increased fascial restricitons  Visit Diagnosis: Muscle weakness (generalized) - Plan: PT plan of care cert/re-cert  Other lack of coordination - Plan: PT plan of care cert/re-cert  Stress incontinence (female) (female) - Plan: PT plan of care cert/re-cert  Incontinence of feces, unspecified fecal incontinence type - Plan: PT plan of care cert/re-cert     Problem List Patient Active Problem List   Diagnosis Date Noted   Plantar fasciitis 05/10/2020   Hypokalemia 12/31/2019   Right cervical radiculopathy 07/29/2018   Vertigo 02/11/2018   Cough 10/09/2017   Allergic rhinitis 10/09/2017   Paresthesia 11/08/2015   Bilateral hearing loss 01/25/2015   Left otitis media 01/25/2015   Lower  back pain 04/26/2013   Pain in toe of left foot 11/11/2012   Positive ANA (antinuclear antibody) 04/23/2012   Preventative health care 04/22/2012   Bilateral hand pain 04/22/2012   Hypertension    Fibroid    Urinary incontinence    Hiatal hernia    GERD with stricture 06/19/2011   Globus sensation 06/19/2011   DIVERTICULOSIS, COLON 12/30/2007   HYPERTENSIVE CARDIOVASCULAR DISEASE 10/28/2007   GASTRITIS 10/28/2007   HIATAL HERNIA 10/28/2007   DEGENERATIVE JOINT DISEASE 10/28/2007   Diabetes ([place]) 06/08/2007   Hyperlipidemia 06/08/2007   ANXIETY 06/08/2007   ESOPHAGEAL STRICTURE 06/08/2007   IBS 06/08/2007   Other tenosynovitis of hand and wrist 06/08/2007    Earlie Counts, PT 02/14/21 11:31 AM   [place] [facility] at [facility] [address] [place], [place], [address] Phone: [phone]   Fax:  [phone]  Name: Daisy Dillon MRN: [id number] Date of Birth: 16-Apr-1946

## 2021-02-14 NOTE — Patient Instructions (Signed)
Access Code: [email or web address] URL: [email or web address] Date: 02/14/2021 Prepared by: Earlie Counts  Exercises Supine Pelvic Floor Contract and Release - 3 x daily - 7 x weekly - 1 sets - 5 reps Supine Pelvic Floor Contraction - 3 x daily - 7 x weekly - 1 sets - 5 reps - 5 sec hold Earlie Counts, PT [facility] [address] [place], [place] [address] W: [phone] See [email or web address]

## 2021-02-20 ENCOUNTER — Other Ambulatory Visit: Payer: Self-pay | Admitting: Endocrinology

## 2021-02-21 ENCOUNTER — Encounter: Payer: Medicare Other | Admitting: Physical Therapy

## 2021-02-21 ENCOUNTER — Encounter: Payer: Self-pay | Admitting: Physical Therapy

## 2021-02-21 ENCOUNTER — Other Ambulatory Visit: Payer: Self-pay

## 2021-02-21 DIAGNOSIS — M6281 Muscle weakness (generalized): Secondary | ICD-10-CM | POA: Diagnosis not present

## 2021-02-21 DIAGNOSIS — R278 Other lack of coordination: Secondary | ICD-10-CM

## 2021-02-21 DIAGNOSIS — R159 Full incontinence of feces: Secondary | ICD-10-CM

## 2021-02-21 DIAGNOSIS — N393 Stress incontinence (female) (male): Secondary | ICD-10-CM

## 2021-02-21 NOTE — Patient Instructions (Signed)
Access Code: [email or web address] URL: [email or web address] Date: 02/21/2021 Prepared by: Earlie Counts  Exercises Hooklying Isometric Hip Flexion - 1 x daily - 4 x weekly - 1 sets - 5 reps - 5 sec hold Supine Bridge with Mini Swiss Ball Between Knees - 1 x daily - 4 x weekly - 1 sets - 10 reps Supine Hip Adduction Isometric with Ball - 1 x daily - 4 x weekly - 1 sets - 10 reps - 5 sec hold Seated Pelvic Floor Contraction - 3 x daily - 7 x weekly - 1 sets - 5 reps - 5 sec hold Seated Cough with Pelvic Floor Contraction and Hand to Mouth - 1 x daily - 7 x weekly - 1 sets - 10 reps Supine Pelvic Floor Contraction - 3 x daily - 7 x weekly - 1 sets - 5 reps - 8 sec hold  Earlie Counts, PT [facility] [address], [place], [place] [address] W: [phone] [email or web address]

## 2021-02-21 NOTE — Therapy (Signed)
[facility] at [facility] [address] [place], [place], [address] Phone: [phone]   Fax:  [phone]  Physical Therapy Treatment  Patient Details  Name: Daisy Dillon MRN: [id number] Date of Birth: 09-19-1945 Referring Provider (PT): Dr. Princess Bruins   Encounter Date: 02/21/2021   PT End of Session - 02/21/21 1119     Visit Number 2    Date for PT Re-Evaluation 05/09/21    Authorization Type UHC medicare    PT Start Time 1030    PT Stop Time 1110    PT Time Calculation (min) 40 min    Activity Tolerance Patient tolerated treatment well    Behavior During Therapy [facility] for tasks assessed/performed             Past Medical History:  Diagnosis Date   Anemia    long ago   Arthritis    Diabetes mellitus    Diverticulosis of colon (without mention of hemorrhage)    Elevated cholesterol    Fibroid    Gastritis    GERD (gastroesophageal reflux disease)    Hiatal hernia    Hypertension    IBS (irritable bowel syndrome)    Positive ANA (antinuclear antibody) 04/23/2012   Stricture and stenosis of esophagus 09/03/2005   Urinary incontinence     Past Surgical History:  Procedure Laterality Date   ABDOMINAL HYSTERECTOMY  1985   BACK SURGERY  11/2016   CHOLECYSTECTOMY     COLONOSCOPY  12/15/2007   diverticulosis   ESOPHAGOGASTRODUODENOSCOPY  12/24/2012   normal    KNEE SURGERY Right    arthroscopic   UPPER GASTROINTESTINAL ENDOSCOPY      There were no vitals filed for this visit.   Subjective Assessment - 02/21/21 1034     Subjective I am leaking less.    Patient Stated Goals learn how to control her urine    Currently in Pain? No/denies                               [facility] PT Treatment/Exercise - 02/21/21 0001       Self-Care   Self-Care Other Self-Care Comments    Other Self-Care Comments  discussed with patient on bladder irritants, how water is helpful, drinking  6-8 glassess of water, limit sodas      Lumbar Exercises: Seated   Other Seated Lumbar Exercises siting pelvic floro ocntraction holding for 5 sec 10x then quick contractions 5 times    Other Seated Lumbar Exercises sitting pelvci floor contraction while lean forward and cough 10x      Lumbar Exercises: Supine   Ab Set 10 reps;5 seconds   with pelvic floro contraction and ball squeeze   Bridge with [employer] 15 reps    Bridge with [employer] Limitations working where there is not increase in back pain    Isometric Hip Flexion 5 reps;5 seconds   right, left   Isometric Hip Flexion Limitations verbal cues to not hold breath, contract the lower abdominals                     PT Education - 02/21/21 1101     Education Details Access Code: [email or web address]; discussed with patient on bladder irritants    Person(s) Educated Patient    Methods Explanation;Demonstration;Verbal cues;Handout    Comprehension Returned demonstration;Verbalized understanding  PT Short Term Goals - 02/14/21 1123       PT SHORT TERM GOAL #1   Title independent with intial HEP for pelvic floor strengthening    Time 4    Period Weeks    Status New    Target Date 03/14/21      PT SHORT TERM GOAL #2   Title hold pelvic floor contraction for 5 seconds in sitting due to increased strength and coordination    Time 4    Period Weeks    Status New    Target Date 03/14/21               PT Long Term Goals - 02/14/21 1124       PT LONG TERM GOAL #1   Title independent with advanced HEP for pelvic floor and core strength    Time 12    Period Weeks    Status New    Target Date 05/09/21      PT LONG TERM GOAL #2   Title able to cough, laugh and sneeze without leaking urine due to increased strength and coordination of the pelvic floor    Time 12    Period Weeks    Status New    Target Date 05/09/21      PT LONG TERM GOAL #3   Title stool leakage decreased >/= 75% due to  increased in pelvic floor strength    Time 12    Period Weeks    Status New    Target Date 05/09/21      PT LONG TERM GOAL #4   Title able to wipe 1-2 times due to reduction of stool leakage after a bowel movement    Time 12    Period Weeks    Status New    Target Date 05/09/21                   Plan - 02/21/21 1101     Clinical Impression Statement Patient reports her urinary leakage is slightly better. She needs verbal cues to not hold her breath with exercise. Patient had more difficulty with feeling the pelvic floor contraction in supine than in sitting. In sitting you could see her contract her lwoer abdominals better than in supine. Patient is drinking mostly water and a soda once in awhile. Patient has not met goal yet due to just starting therapy.    Personal Factors and Comorbidities Age;Comorbidity 3+    Comorbidities abdominal hysterectomy; back surgery; DM; IBS    Examination-Activity Limitations Continence;Toileting;Other    Examination-Participation Restrictions Community Activity    Stability/Clinical Decision Making Stable/Uncomplicated    Rehab Potential Excellent    PT Frequency 1x / week    PT Duration 12 weeks    PT Treatment/Interventions ADLs/Self Care Home Management;Biofeedback;Neuromuscular re-education;Therapeutic activities;Therapeutic exercise;Patient/family education;Manual techniques    PT Next Visit Plan stanidng palloff, standing hip extension, standinghip flexor stretch, toileting, ask about stool leakage and how many times is she wiping    PT Home Exercise Plan Access Code: [email or web address]    Recommended Other Services MD signed initial eval    Consulted and Agree with Plan of Care Patient             Patient will benefit from skilled therapeutic intervention in order to improve the following deficits and impairments:  Decreased endurance, Decreased activity tolerance, Decreased strength, Increased fascial restricitons  Visit  Diagnosis: Muscle weakness (generalized)  Other lack of coordination  Stress incontinence (female) (  female)  Incontinence of feces, unspecified fecal incontinence type     Problem List Patient Active Problem List   Diagnosis Date Noted   Plantar fasciitis 05/10/2020   Hypokalemia 12/31/2019   Right cervical radiculopathy 07/29/2018   Vertigo 02/11/2018   Cough 10/09/2017   Allergic rhinitis 10/09/2017   Paresthesia 11/08/2015   Bilateral hearing loss 01/25/2015   Left otitis media 01/25/2015   Lower back pain 04/26/2013   Pain in toe of left foot 11/11/2012   Positive ANA (antinuclear antibody) 04/23/2012   Preventative health care 04/22/2012   Bilateral hand pain 04/22/2012   Hypertension    Fibroid    Urinary incontinence    Hiatal hernia    GERD with stricture 06/19/2011   Globus sensation 06/19/2011   DIVERTICULOSIS, COLON 12/30/2007   HYPERTENSIVE CARDIOVASCULAR DISEASE 10/28/2007   GASTRITIS 10/28/2007   HIATAL HERNIA 10/28/2007   DEGENERATIVE JOINT DISEASE 10/28/2007   Diabetes ([place]) 06/08/2007   Hyperlipidemia 06/08/2007   ANXIETY 06/08/2007   ESOPHAGEAL STRICTURE 06/08/2007   IBS 06/08/2007   Other tenosynovitis of hand and wrist 06/08/2007    Earlie Counts, PT 02/21/21 11:24 AM  [place] [facility] at [facility] [address] [place], [place], [address] Phone: [phone]   Fax:  [phone]  Name: Daisy Dillon MRN: [id number] Date of Birth: 02-Sep-1945

## 2021-02-28 ENCOUNTER — Ambulatory Visit: Payer: Medicare Other | Admitting: Physical Therapy

## 2021-02-28 ENCOUNTER — Encounter: Payer: Self-pay | Admitting: Physical Therapy

## 2021-02-28 ENCOUNTER — Other Ambulatory Visit: Payer: Self-pay

## 2021-02-28 DIAGNOSIS — R159 Full incontinence of feces: Secondary | ICD-10-CM

## 2021-02-28 DIAGNOSIS — M6281 Muscle weakness (generalized): Secondary | ICD-10-CM

## 2021-02-28 DIAGNOSIS — N393 Stress incontinence (female) (male): Secondary | ICD-10-CM

## 2021-02-28 DIAGNOSIS — R278 Other lack of coordination: Secondary | ICD-10-CM

## 2021-02-28 NOTE — Patient Instructions (Addendum)
About Abdominal Massage  Abdominal massage, also called external colon massage, is a self-treatment circular massage technique that can reduce and eliminate gas and ease constipation. The colon naturally contracts in waves in a clockwise direction starting from inside the right hip, moving up toward the ribs, across the belly, and down inside the left hip.  When you perform circular abdominal massage, you help stimulate your colon's normal wave pattern of movement called peristalsis.  It is most beneficial when done after eating.  Positioning You can practice abdominal massage with oil while lying down, or in the shower with soap.  Some people find that it is just as effective to do the massage through clothing while sitting or standing.  How to Massage Start by placing your finger tips or knuckles on your right side, just inside your hip bone.  Make small circular movements while you move upward toward your rib cage.   Once you reach the bottom right side of your rib cage, take your circular movements across to the left side of the bottom of your rib cage.  Next, move downward until you reach the inside of your left hip bone.  This is the path your feces travel in your colon. Continue to perform your abdominal massage in this pattern for 10 minutes each day.     You can apply as much pressure as is comfortable in your massage.  Start gently and build pressure as you continue to practice.  Notice any areas of pain as you massage; areas of slight pain may be relieved as you massage, but if you have areas of significant or intense pain, consult with your healthcare provider.  Other Considerations General physical activity including bending and stretching can have a beneficial massage-like effect on the colon.  Deep breathing can also stimulate the colon because breathing deeply activates the same nervous system that supplies the colon.   Abdominal massage should always be used in combination with a  bowel-conscious diet that is high in the proper type of fiber for you, fluids (primarily water), and a regular exercise program.  Earlie Counts, PT [facility] [place], [place], [place] [address] W: [phone] [email or web address]  Access Code: [facility] URL: [email or web address] Date: 02/28/2021 Prepared by: Earlie Counts  Exercises Supine Hip Adduction Isometric with Diona Foley - 1 x daily - 4 x weekly - 1 sets - 10 reps - 5 sec hold Seated Pelvic Floor Contraction - 3 x daily - 7 x weekly - 1 sets - 5 reps - 8 sec hold Supine Pelvic Floor Contraction - 3 x daily - 7 x weekly - 1 sets - 5 reps - 8 sec hold Supine March - 1 x daily - 4 x weekly - 1 sets - 5 reps Sit to Stand with Pelvic Floor Contraction - 1 x daily - 7 x weekly - 1 sets - 10 reps Standing Weight Shifting Forward and Backward - 1 x daily - 7 x weekly - 1 sets - 10 reps

## 2021-02-28 NOTE — Therapy (Signed)
[facility] at [facility] [address], [place], [place], [address] Phone: [phone]   Fax:  [phone]  Physical Therapy Treatment  Patient Details  Name: Daisy Dillon MRN: [id number] Date of Birth: [birth year] Referring Provider (PT): Dr. Princess Bruins   Encounter Date: 02/28/2021   PT End of Session - 02/28/21 1122     Visit Number 3    Date for PT Re-Evaluation 05/09/21    Authorization Type [place] - Visit Number 3    Authorization - Number of Visits 10    PT Start Time [id number]    PT Stop Time 1115    PT Time Calculation (min) 45 min    Activity Tolerance Patient tolerated treatment well    Behavior During Therapy [facility] for tasks assessed/performed             Past Medical History:  Diagnosis Date   Anemia    long ago   Arthritis    Diabetes mellitus    Diverticulosis of colon (without mention of hemorrhage)    Elevated cholesterol    Fibroid    Gastritis    GERD (gastroesophageal reflux disease)    Hiatal hernia    Hypertension    IBS (irritable bowel syndrome)    Positive ANA (antinuclear antibody) 04/23/2012   Stricture and stenosis of esophagus 09/03/2005   Urinary incontinence     Past Surgical History:  Procedure Laterality Date   ABDOMINAL HYSTERECTOMY  1985   BACK SURGERY  11/2016   CHOLECYSTECTOMY     COLONOSCOPY  12/15/2007   diverticulosis   ESOPHAGOGASTRODUODENOSCOPY  12/24/2012   normal    KNEE SURGERY Right    arthroscopic   UPPER GASTROINTESTINAL ENDOSCOPY      There were no vitals filed for this visit.   Subjective Assessment - 02/28/21 1031     Subjective My pads are dry and do not remember when they were wet. I have always worn liners. Stool leakage 1 time in 2 weeks. I am still wiping alot.    Patient Stated Goals learn how to control her urine    Currently in Pain? No/denies    Multiple Pain Sites No                                [facility] Adult PT Treatment/Exercise - 02/28/21 0001       Lumbar Exercises: Standing   Other Standing Lumbar Exercises standing weight shift foward and back 10 times with each foot forward      Lumbar Exercises: Seated   Sit to Stand 10 reps    Sit to Stand Limitations contract the gluteal to fully extend hips into extension and not use her hands    Other Seated Lumbar Exercises siting pelvic floor contraction holding for 8 sec 10x then quick contractions 5 times      Lumbar Exercises: Supine   Bent Knee Raise 5 reps   right , left   Bent Knee Raise Limitations contract the lower abdominals and pelvic floor      Manual Therapy   Manual Therapy Soft tissue mobilization;Myofascial release    Manual therapy comments educated patient on how to perfrom abdominal massage at home    Soft tissue mobilization circular massage to promote peristalic motion of the intestines to move the stool and form longer stool    Myofascial Release fascial release along the lower  abdomen, lifiting the small intestines off the bladder, release around the ilicecal valve                     PT Education - 02/28/21 1120     Education Details Access Code: [email or web address]; abdominal massage    Person(s) Educated Patient    Methods Explanation;Demonstration;Verbal cues;Handout    Comprehension Returned demonstration;Verbalized understanding              PT Short Term Goals - 02/28/21 1033       PT SHORT TERM GOAL #1   Title independent with intial HEP for pelvic floor strengthening    Time 4    Period Weeks    Status Achieved      PT SHORT TERM GOAL #2   Title hold pelvic floor contraction for 5 seconds in sitting due to increased strength and coordination    Time 4    Period Weeks    Status Achieved               PT Long Term Goals - 02/14/21 1124       PT LONG TERM GOAL #1   Title independent with advanced HEP for pelvic floor and core  strength    Time 12    Period Weeks    Status New    Target Date 05/09/21      PT LONG TERM GOAL #2   Title able to cough, laugh and sneeze without leaking urine due to increased strength and coordination of the pelvic floor    Time 12    Period Weeks    Status New    Target Date 05/09/21      PT LONG TERM GOAL #3   Title stool leakage decreased >/= 75% due to increased in pelvic floor strength    Time 12    Period Weeks    Status New    Target Date 05/09/21      PT LONG TERM GOAL #4   Title able to wipe 1-2 times due to reduction of stool leakage after a bowel movement    Time 12    Period Weeks    Status New    Target Date 05/09/21                   Plan - 02/28/21 1122     Clinical Impression Statement Patient reports she wears a light day pad and does not remember when it has been wet. Patient had only had 1 stool leakage since last visit. She continues to have to wipe several times after a bowel movement. Patient has Type 1 stool that is soft and a little longer. She has fascial restrictions in the lower abdomen that could restrict the bowel movements. She has learned new exercises and can feel the pelvic floor contract. Patient is able to engage her lower abdominals with greater ease. Patient will benefit from skilled therapy to improve pelvic floor coordination and strength to reduce leakage.    Personal Factors and Comorbidities Age;Comorbidity 3+    Comorbidities abdominal hysterectomy; back surgery; DM; IBS    Examination-Activity Limitations Continence;Toileting;Other   coughing, sneezing   Examination-Participation Restrictions Community Activity    Stability/Clinical Decision Making Stable/Uncomplicated    Rehab Potential Excellent    PT Frequency 1x / week    PT Duration 12 weeks    PT Treatment/Interventions ADLs/Self Care Home Management;Biofeedback;Neuromuscular re-education;Therapeutic activities;Therapeutic exercise;Patient/family education;Manual  techniques    PT Next Visit  Plan stanidng palloff, standing hip extension, standinghip flexor stretch, toileting, abdominal massage    PT Home Exercise Plan Access Code: [email or web address]    Consulted and Agree with Plan of Care Patient             Patient will benefit from skilled therapeutic intervention in order to improve the following deficits and impairments:  Decreased endurance, Decreased activity tolerance, Decreased strength, Increased fascial restricitons  Visit Diagnosis: Muscle weakness (generalized)  Other lack of coordination  Stress incontinence (female) (female)  Incontinence of feces, unspecified fecal incontinence type     Problem List Patient Active Problem List   Diagnosis Date Noted   Plantar fasciitis 05/10/2020   Hypokalemia 12/31/2019   Right cervical radiculopathy 07/29/2018   Vertigo 02/11/2018   Cough 10/09/2017   Allergic rhinitis 10/09/2017   Paresthesia 11/08/2015   Bilateral hearing loss 01/25/2015   Left otitis media 01/25/2015   Lower back pain 04/26/2013   Pain in toe of left foot 11/11/2012   Positive ANA (antinuclear antibody) 04/23/2012   Preventative health care 04/22/2012   Bilateral hand pain 04/22/2012   Hypertension    Fibroid    Urinary incontinence    Hiatal hernia    GERD with stricture 06/19/2011   Globus sensation 06/19/2011   DIVERTICULOSIS, COLON 12/30/2007   HYPERTENSIVE CARDIOVASCULAR DISEASE 10/28/2007   GASTRITIS 10/28/2007   HIATAL HERNIA 10/28/2007   DEGENERATIVE JOINT DISEASE 10/28/2007   Diabetes ([place]) 06/08/2007   Hyperlipidemia 06/08/2007   ANXIETY 06/08/2007   ESOPHAGEAL STRICTURE 06/08/2007   IBS 06/08/2007   Other tenosynovitis of hand and wrist 06/08/2007    Earlie Counts, PT 02/28/21 11:28 AM  [place] [facility] at [facility] [address] [place], [place], [address] Phone: [phone]   Fax:  [phone]  Name: Roxette Wulfekuhle Massingale MRN: [id number] Date  of Birth: August 27, 1945

## 2021-03-06 ENCOUNTER — Other Ambulatory Visit: Payer: Self-pay | Admitting: Endocrinology

## 2021-03-07 ENCOUNTER — Other Ambulatory Visit: Payer: Self-pay

## 2021-03-07 ENCOUNTER — Ambulatory Visit: Payer: Medicare Other | Admitting: Physical Therapy

## 2021-03-07 ENCOUNTER — Encounter: Payer: Self-pay | Admitting: Physical Therapy

## 2021-03-07 DIAGNOSIS — M6281 Muscle weakness (generalized): Secondary | ICD-10-CM

## 2021-03-07 DIAGNOSIS — N393 Stress incontinence (female) (male): Secondary | ICD-10-CM

## 2021-03-07 DIAGNOSIS — R278 Other lack of coordination: Secondary | ICD-10-CM | POA: Diagnosis not present

## 2021-03-07 DIAGNOSIS — R159 Full incontinence of feces: Secondary | ICD-10-CM

## 2021-03-07 NOTE — Therapy (Signed)
[facility] Outpatient Rehabilitation at [facility] for [facility] [address] [place], [place], [address] Phone: [phone]   Fax:  [phone]  Physical Therapy Treatment  Patient Details  Name: Daisy Dillon MRN: [id number] Date of Birth: 11/24/1945 Referring Provider (PT): Dr. Princess Bruins   Encounter Date: 03/07/2021   PT End of Session - 03/07/21 1113     Visit Number 4    Date for PT Re-Evaluation 05/09/21    Authorization Type [place] - Visit Number 4    Authorization - Number of Visits 10    PT Start Time [id number]    PT Stop Time 1114    PT Time Calculation (min) 44 min    Activity Tolerance Patient tolerated treatment well    Behavior During Therapy [facility] for tasks assessed/performed             Past Medical History:  Diagnosis Date   Anemia    long ago   Arthritis    Diabetes mellitus    Diverticulosis of colon (without mention of hemorrhage)    Elevated cholesterol    Fibroid    Gastritis    GERD (gastroesophageal reflux disease)    Hiatal hernia    Hypertension    IBS (irritable bowel syndrome)    Positive ANA (antinuclear antibody) 04/23/2012   Stricture and stenosis of esophagus 09/03/2005   Urinary incontinence     Past Surgical History:  Procedure Laterality Date   ABDOMINAL HYSTERECTOMY  1985   BACK SURGERY  11/2016   CHOLECYSTECTOMY     COLONOSCOPY  12/15/2007   diverticulosis   ESOPHAGOGASTRODUODENOSCOPY  12/24/2012   normal    KNEE SURGERY Right    arthroscopic   UPPER GASTROINTESTINAL ENDOSCOPY      There were no vitals filed for this visit.   Subjective Assessment - 03/07/21 1037     Subjective I am practicing getting  up without my hands. Stool leakage 1 time since last visit. Pantylinesr are mostly dry. Urinary leakage is 60% better.    Patient Stated Goals learn how to control her urine    Currently in Pain? No/denies    Multiple Pain Sites No                                [facility] Adult PT Treatment/Exercise - 03/07/21 0001       Lumbar Exercises: Standing   Other Standing Lumbar Exercises standing with hands on wall moving opposite arm and leg and contracting the pelvic floor monitoring for gluteal contraction 10x each way      Lumbar Exercises: Seated   Sit to Stand 10 reps    Sit to Stand Limitations with red band around knees to push the knees outward    Other Seated Lumbar Exercises sit in chair and move ball forward 10x then to the side 10x each with pelvic floro contarction and breath to facilitate a cough or sneeze and stretch the rib cage; cough and contract pelvic floor and lean forward 10x    Other Seated Lumbar Exercises seated knee outward 2x10 with red band and pelvic floor contraction                     PT Education - 03/07/21 1108     Education Details Access Code: [facility]    Person(s) Educated Patient    Methods Explanation;Demonstration;Verbal cues;Handout    Comprehension Verbalized  understanding;Returned demonstration              PT Short Term Goals - 02/28/21 1033       PT SHORT TERM GOAL #1   Title independent with intial HEP for pelvic floor strengthening    Time 4    Period Weeks    Status Achieved      PT SHORT TERM GOAL #2   Title hold pelvic floor contraction for 5 seconds in sitting due to increased strength and coordination    Time 4    Period Weeks    Status Achieved               PT Long Term Goals - 03/07/21 1039       PT LONG TERM GOAL #1   Title independent with advanced HEP for pelvic floor and core strength    Time 12    Period Weeks    Status On-going      PT LONG TERM GOAL #2   Title able to cough, laugh and sneeze without leaking urine due to increased strength and coordination of the pelvic floor    Baseline very little leakage    Time 12    Period Weeks    Status On-going      PT LONG TERM GOAL #3   Title stool leakage decreased  >/= 75% due to increased in pelvic floor strength    Baseline 60% better    Time 12    Period Weeks    Status On-going      PT LONG TERM GOAL #4   Title able to wipe 1-2 times due to reduction of stool leakage after a bowel movement    Time 12    Period Weeks    Status On-going                   Plan - 03/07/21 1113     Clinical Impression Statement Patient continues to wear a light pad. She reports her stool leakage only happens 1 time since last session. Her urinary leakage is 60% better. She is able to go from sit to stand without her arms with greater ease. Patient is able to feel her pelvic floor contract. She understands to contract her pelvic floor prior to coughing or sneezing. Patient is able to fully empty her stools but continues to wipe several times. Patient will benefit from skilled therapy to improve pelvic floor coordination and strength to reduce leakage.    Personal Factors and Comorbidities Age;Comorbidity 3+    Comorbidities abdominal hysterectomy; back surgery; DM; IBS    Examination-Activity Limitations Continence;Toileting;Other    Examination-Participation Restrictions Community Activity    Stability/Clinical Decision Making Stable/Uncomplicated    Rehab Potential Excellent    PT Frequency 1x / week    PT Duration 12 weeks    PT Treatment/Interventions ADLs/Self Care Home Management;Biofeedback;Neuromuscular re-education;Therapeutic activities;Therapeutic exercise;Patient/family education;Manual techniques    PT Next Visit Plan reviwe HEP; possible discharge    PT Home Exercise Plan Access Code: [email or web address]    Consulted and Agree with Plan of Care Patient             Patient will benefit from skilled therapeutic intervention in order to improve the following deficits and impairments:  Decreased endurance, Decreased activity tolerance, Decreased strength, Increased fascial restricitons  Visit Diagnosis: Muscle weakness (generalized)  Other lack of  coordination  Stress incontinence (female) (female)  Incontinence of feces, unspecified fecal incontinence type  Problem List Patient Active Problem List   Diagnosis Date Noted   Plantar fasciitis 05/10/2020   Hypokalemia 12/31/2019   Right cervical radiculopathy 07/29/2018   Vertigo 02/11/2018   Cough 10/09/2017   Allergic rhinitis 10/09/2017   Paresthesia 11/08/2015   Bilateral hearing loss 01/25/2015   Left otitis media 01/25/2015   Lower back pain 04/26/2013   Pain in toe of left foot 11/11/2012   Positive ANA (antinuclear antibody) 04/23/2012   Preventative health care 04/22/2012   Bilateral hand pain 04/22/2012   Hypertension    Fibroid    Urinary incontinence    Hiatal hernia    GERD with stricture 06/19/2011   Globus sensation 06/19/2011   DIVERTICULOSIS, COLON 12/30/2007   HYPERTENSIVE CARDIOVASCULAR DISEASE 10/28/2007   GASTRITIS 10/28/2007   HIATAL HERNIA 10/28/2007   DEGENERATIVE JOINT DISEASE 10/28/2007   Diabetes ([place]) 06/08/2007   Hyperlipidemia 06/08/2007   ANXIETY 06/08/2007   ESOPHAGEAL STRICTURE 06/08/2007   IBS 06/08/2007   Other tenosynovitis of hand and wrist 06/08/2007    Earlie Counts, PT 03/07/21 11:19 AM  [place] [facility] at [facility] [address], [place], [place], [address] Phone: [phone]   Fax:  [phone]  Name: Quincee Gittens Braaksma MRN: [id number] Date of Birth: 05/31/1946

## 2021-03-07 NOTE — Patient Instructions (Signed)
Access Code: [email or web address] URL: [email or web address] Date: 03/07/2021 Prepared by: Earlie Counts  Exercises Seated Pelvic Floor Contraction - 3 x daily - 7 x weekly - 1 sets - 5 reps - 8 sec hold Supine Pelvic Floor Contraction - 3 x daily - 7 x weekly - 1 sets - 5 reps - 8 sec hold Supine March - 1 x daily - 4 x weekly - 1 sets - 5 reps Sit to Stand with Pelvic Floor Contraction - 1 x daily - 7 x weekly - 1 sets - 10 reps Standing Weight Shifting Forward and Backward - 1 x daily - 4 x weekly - 1 sets - 10 reps Seated Pelvic Floor Contraction with Hip Abduction and Resistance Loop - 1 x daily - 4 x weekly - 1 sets - 10 reps Bird Dog on Counter - 1 x daily - 4 x weekly - 1 sets - 10 reps  Earlie Counts, PT [facility] [place] [address], [place], [place] [address] W: [phone] [name]

## 2021-03-14 ENCOUNTER — Encounter: Payer: Self-pay | Admitting: Physical Therapy

## 2021-03-14 ENCOUNTER — Encounter: Payer: Medicare Other | Admitting: Physical Therapy

## 2021-03-14 ENCOUNTER — Other Ambulatory Visit: Payer: Self-pay

## 2021-03-14 DIAGNOSIS — R278 Other lack of coordination: Secondary | ICD-10-CM | POA: Diagnosis not present

## 2021-03-14 DIAGNOSIS — M6281 Muscle weakness (generalized): Secondary | ICD-10-CM

## 2021-03-14 DIAGNOSIS — R159 Full incontinence of feces: Secondary | ICD-10-CM

## 2021-03-14 DIAGNOSIS — N393 Stress incontinence (female) (male): Secondary | ICD-10-CM

## 2021-03-14 NOTE — Therapy (Signed)
[facility] at [facility] [address], [place], [place], [address] Phone: [phone]   Fax:  [phone]  Physical Therapy Treatment  Patient Details  Name: Daisy Dillon MRN: [id number] Date of Birth: [birth year] Referring Provider (PT): Dr. Princess Bruins   Encounter Date: 03/14/2021   PT End of Session - 03/14/21 1123     Visit Number 5    Date for PT Re-Evaluation 05/09/21    Authorization Type [place] - Visit Number 5    Authorization - Number of Visits 10    PT Start Time 8099    PT Stop Time 1115    PT Time Calculation (min) 45 min    Activity Tolerance Patient tolerated treatment well    Behavior During Therapy [facility] for tasks assessed/performed             Past Medical History:  Diagnosis Date   Anemia    long ago   Arthritis    Diabetes mellitus    Diverticulosis of colon (without mention of hemorrhage)    Elevated cholesterol    Fibroid    Gastritis    GERD (gastroesophageal reflux disease)    Hiatal hernia    Hypertension    IBS (irritable bowel syndrome)    Positive ANA (antinuclear antibody) 04/23/2012   Stricture and stenosis of esophagus 09/03/2005   Urinary incontinence     Past Surgical History:  Procedure Laterality Date   ABDOMINAL HYSTERECTOMY  1985   BACK SURGERY  11/2016   CHOLECYSTECTOMY     COLONOSCOPY  12/15/2007   diverticulosis   ESOPHAGOGASTRODUODENOSCOPY  12/24/2012   normal    KNEE SURGERY Right    arthroscopic   UPPER GASTROINTESTINAL ENDOSCOPY      There were no vitals filed for this visit.   Subjective Assessment - 03/14/21 1043     Subjective My pantyliners are dry. I am having trouble with feal leakage and happened one time over the weekend. Type 5 or 6 bowel movements.    Patient Stated Goals learn how to control her urine    Currently in Pain? 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 Princess Bruins    Onset Date/Surgical Date --   several months     Precautions   Precautions None      Restrictions   Weight Bearing Restrictions No      Home Environment   Living Environment Private residence      Prior Function   Level of Independence Independent    Leisure no due to back paroblems      Cognition   Overall Cognitive Status Within Functional Limits for tasks assessed      Posture/Postural Control   Posture/Postural Control Postural limitations    Postural Limitations Flexed trunk;Forward head;Rounded Shoulders      Strength   Right Hip Extension 4/5    Right Hip ABduction 4/5    Left Hip Extension 4/5    Left Hip ABduction 4/5                        Pelvic Floor Special Questions - 03/14/21 0001     Urinary Leakage No  [place] [facility] PT Treatment/Exercise - 03/14/21 0001       Self-Care   Self-Care Other Self-Care Comments    Other Self-Care Comments  educated patient on insoluble fiber to bulk up her stool due to it being Type 5 and 6      Lumbar Exercises: Seated   Other Seated Lumbar Exercises sit in chair and move ball forward 10x then to the side 10x each with pelvic floro contarction and breath to facilitate a cough or sneeze and stretch the rib cage; cough and contract pelvic floor and lean forward 10x    Other Seated Lumbar Exercises seated knee outward 2x10 with red band and pelvic floor contraction      Manual Therapy   Manual Therapy Soft tissue mobilization;Myofascial release    Manual therapy comments educated patient on how to do at home and she returned demonstration    Soft tissue mobilization circular massage to the abdomen to improve peristalic motion of the intestines to assist her not having to go to the bathroom 4 times in the morning    Myofascial Release faslcial release on the lower abdomen and  alon g th eumbilicus to release fascial restrictions                     PT Education - 03/14/21 1122     Education Details education on using insouluble fiber to bulk up the stool; educated on abdominal massage    Person(s) Educated Patient    Methods Explanation;Demonstration;Handout    Comprehension Verbalized understanding;Returned demonstration              PT Short Term Goals - 02/28/21 1033       PT SHORT TERM GOAL #1   Title independent with intial HEP for pelvic floor strengthening    Time 4    Period Weeks    Status Achieved      PT SHORT TERM GOAL #2   Title hold pelvic floor contraction for 5 seconds in sitting due to increased strength and coordination    Time 4    Period Weeks    Status Achieved               PT Long Term Goals - 03/14/21 1124       PT LONG TERM GOAL #1   Title independent with advanced HEP for pelvic floor and core strength    Time 12    Period Weeks    Status Achieved      PT LONG TERM GOAL #2   Title able to cough, laugh and sneeze without leaking urine due to increased strength and coordination of the pelvic floor    Time 12    Period Weeks    Status Achieved      PT LONG TERM GOAL #3   Title stool leakage decreased >/= 75% due to increased in pelvic floor strength    Time 12    Period Weeks    Status Achieved      PT LONG TERM GOAL #4   Title able to wipe 1-2 times due to reduction of stool leakage after a bowel movement    Time 12    Period Weeks    Status Achieved                   Plan - 03/14/21 1124     Clinical Impression Statement Patient reports she is not leaking urine. She has leaked stool 1 time this  weekend but is 75% better. Patient will wipe 1-2 times due to the consistency fo the stool. Patient is able to have Type 5 or 6 stool so educated her on adding insoluble fiber to thicken the consistency of her stool and educated on abdominal massage to reduce the amount of time she  goes to the bathroom in the morning. Patient is independent with her HEP. Patient is ready for discharge.    Personal Factors and Comorbidities Age;Comorbidity 3+    Comorbidities abdominal hysterectomy; back surgery; DM; IBS    Examination-Activity Limitations Continence;Toileting;Other    Examination-Participation Restrictions Community Activity    Stability/Clinical Decision Making Stable/Uncomplicated    Rehab Potential Excellent    PT Treatment/Interventions ADLs/Self Care Home Management;Biofeedback;Neuromuscular re-education;Therapeutic activities;Therapeutic exercise;Patient/family education;Manual techniques    PT Next Visit Plan Discharge to HEP    PT Home Exercise Plan Access Code: [email or web address]    [email or web address] and Agree with Plan of Care Patient             Patient will benefit from skilled therapeutic intervention in order to improve the following deficits and impairments:  Decreased endurance, Decreased activity tolerance, Decreased strength, Increased fascial restricitons  Visit Diagnosis: Muscle weakness (generalized)  Other lack of coordination  Stress incontinence (female) (female)  Incontinence of feces, unspecified fecal incontinence type     Problem List Patient Active Problem List   Diagnosis Date Noted   Plantar fasciitis 05/10/2020   Hypokalemia 12/31/2019   Right cervical radiculopathy 07/29/2018   Vertigo 02/11/2018   Cough 10/09/2017   Allergic rhinitis 10/09/2017   Paresthesia 11/08/2015   Bilateral hearing loss 01/25/2015   Left otitis media 01/25/2015   Lower back pain 04/26/2013   Pain in toe of left foot 11/11/2012   Positive ANA (antinuclear antibody) 04/23/2012   Preventative health care 04/22/2012   Bilateral hand pain 04/22/2012   Hypertension    Fibroid    Urinary incontinence    Hiatal hernia    GERD with stricture 06/19/2011   Globus sensation 06/19/2011   DIVERTICULOSIS, COLON 12/30/2007   HYPERTENSIVE CARDIOVASCULAR DISEASE  10/28/2007   GASTRITIS 10/28/2007   HIATAL HERNIA 10/28/2007   DEGENERATIVE JOINT DISEASE 10/28/2007   Diabetes ([place]) 06/08/2007   Hyperlipidemia 06/08/2007   ANXIETY 06/08/2007   ESOPHAGEAL STRICTURE 06/08/2007   IBS 06/08/2007   Other tenosynovitis of hand and wrist 06/08/2007    Earlie Counts, PT 03/14/21 11:32 AM  [facility] at [facility] [address], [place], [place], [address] Phone: [phone]   Fax:  [phone]  Name: Daisy Dillon MRN: [id number] Date of Birth: 12-31-1945  PHYSICAL THERAPY DISCHARGE SUMMARY  Visits from Start of Care: 5  Current functional level related to goals / functional outcomes: See above.    Remaining deficits: See above.    Education / Equipment: HEP   Patient agrees to discharge. Patient goals were met. Patient is being discharged due to meeting the stated rehab goals. Thank you for the referral. Earlie Counts, PT 03/14/21 11:31 AM

## 2021-03-14 NOTE — Patient Instructions (Addendum)
Types of Fiber  There are two main types of fiber:  insoluble and soluble.  Both of these types can prevent and relieve constipation and diarrhea, although some people find one or the other to be more easily digested.  This handout details information about both types of fiber.  Insoluble Fiber       Functions of Insoluble Fiber moves bulk through the intestines  controls and balances the pH (acidity) in the intestines       Benefits of Insoluble Fiber promotes regular bowel movement and prevents constipation  removes fecal waste through colon in less time  keeps an optimal pH in intestines to prevent microbes from producing cancer substances, therefore preventing colon cancer        Food Sources of Insoluble Fiber whole-wheat products  wheat bran "miller's bran" corn bran  flax seed or other seeds vegetables such as green beans, broccoli, cauliflower and potato skins  fruit skins and root vegetable skins  popcorn brown rice Psyllum husks   About Abdominal Massage  Abdominal massage, also called external colon massage, is a self-treatment circular massage technique that can reduce and eliminate gas and ease constipation. The colon naturally contracts in waves in a clockwise direction starting from inside the right hip, moving up toward the ribs, across the belly, and down inside the left hip.  When you perform circular abdominal massage, you help stimulate your colon's normal wave pattern of movement called peristalsis.  It is most beneficial when done after eating.  Positioning You can practice abdominal massage with oil while lying down, or in the shower with soap.  Some people find that it is just as effective to do the massage through clothing while sitting or standing.  How to Massage Start by placing your finger tips or knuckles on your right side, just inside your hip bone.  Make small circular movements while you move upward toward your rib cage.   Once you reach the  bottom right side of your rib cage, take your circular movements across to the left side of the bottom of your rib cage.  Next, move downward until you reach the inside of your left hip bone.  This is the path your feces travel in your colon. Continue to perform your abdominal massage in this pattern for 10 minutes each day.     You can apply as much pressure as is comfortable in your massage.  Start gently and build pressure as you continue to practice.  Notice any areas of pain as you massage; areas of slight pain may be relieved as you massage, but if you have areas of significant or intense pain, consult with your healthcare provider.  Other Considerations General physical activity including bending and stretching can have a beneficial massage-like effect on the colon.  Deep breathing can also stimulate the colon because breathing deeply activates the same nervous system that supplies the colon.   Abdominal massage should always be used in combination with a bowel-conscious diet that is high in the proper type of fiber for you, fluids (primarily water), and a regular exercise program.   Earlie Counts, PT [facility] [place] [address] [place], [place] [address] W: [phone] [email or web address]

## 2021-03-19 ENCOUNTER — Other Ambulatory Visit: Payer: Self-pay | Admitting: Obstetrics & Gynecology

## 2021-03-19 ENCOUNTER — Other Ambulatory Visit: Payer: Self-pay | Admitting: Endocrinology

## 2021-03-19 DIAGNOSIS — Z1231 Encounter for screening mammogram for malignant neoplasm of breast: Secondary | ICD-10-CM

## 2021-04-23 ENCOUNTER — Ambulatory Visit
Admission: RE | Admit: 2021-04-23 | Discharge: 2021-04-23 | Disposition: A | Discharge status: Home / Self Care | Payer: Medicare Other | Source: Ambulatory Visit | Attending: Obstetrics & Gynecology | Admitting: Obstetrics & Gynecology

## 2021-04-23 DIAGNOSIS — Z1231 Encounter for screening mammogram for malignant neoplasm of breast: Secondary | ICD-10-CM

## 2021-04-25 ENCOUNTER — Telehealth: Payer: Self-pay | Admitting: Gastroenterology

## 2021-04-25 MED ORDER — HYOSCYAMINE SULFATE 0.125 MG SL SUBL: SUBLINGUAL_TABLET | SUBLINGUAL | 3 refills | Status: DC

## 2021-04-25 NOTE — Telephone Encounter (Signed)
Hyoscyamine 0.125 mg 1-2 SL/PO ac and hs, 1 year of refills

## 2021-04-25 NOTE — Telephone Encounter (Signed)
Inbound call from pt requesting a call back stating that Dicyclomine is on back order and wanted to know if it was an alternative medication. Please advise. Thank you.

## 2021-04-25 NOTE — Telephone Encounter (Signed)
Informed patient I sent her hyoscyamine to her mail order pharmacy. Patient verbalized understanding.

## 2021-04-25 NOTE — Telephone Encounter (Signed)
Patient states she called several local pharmacies and dicyclomine is on back order. Patient wants to know if we can prescribe her an alternative medication to her mail order pharmacy. Please advise Dr. Fuller Plan.

## 2021-05-06 NOTE — Telephone Encounter (Signed)
Glycopyrrolate 1 mg po bid, 1 year of refills

## 2021-05-06 NOTE — Telephone Encounter (Signed)
Inbound call from pt stating that the Hyoscyamine isn't covered under her insurance and needs an alternative. Please advise.

## 2021-05-06 NOTE — Telephone Encounter (Signed)
Hyoscyamine is not covered under insurance plan and dicyclomine is on back order. Do you want to try robinul next Dr. Fuller Plan?

## 2021-05-07 MED ORDER — GLYCOPYRROLATE 1 MG PO TABS: 1.0000 mg | ORAL_TABLET | Freq: Two times a day (BID) | ORAL | 3 refills | Status: DC

## 2021-05-07 NOTE — Addendum Note (Signed)
Addended by: Dorisann Frames L on: 05/07/2021 08:48 AM   Modules accepted: Orders

## 2021-05-07 NOTE — Telephone Encounter (Signed)
Left voicemail that new prescription has been sent to patient's pharmacy. To call back if this one is not covered by insurance also.

## 2021-05-21 ENCOUNTER — Other Ambulatory Visit: Payer: Self-pay

## 2021-05-21 ENCOUNTER — Telehealth: Payer: Self-pay | Admitting: Gastroenterology

## 2021-05-21 MED ORDER — DICYCLOMINE HCL 10 MG PO CAPS
20.0000 mg | ORAL_CAPSULE | Freq: Three times a day (TID) | ORAL | 1 refills | Status: DC
Start: 2021-05-21 — End: 2021-12-09

## 2021-05-21 NOTE — Telephone Encounter (Signed)
Patient states her mail order pharmacy cannot get dicyclomine 20 mg three times a day but can get dicyclomine 10 mg and take two pills. Informed patient I will send in dicyclomine 10 mg.

## 2021-05-27 ENCOUNTER — Other Ambulatory Visit: Payer: Self-pay | Admitting: Endocrinology

## 2021-07-15 ENCOUNTER — Telehealth: Payer: Self-pay | Admitting: Gastroenterology

## 2021-07-15 NOTE — Telephone Encounter (Signed)
Patient states it is time for her help at hand Dexilant patient assistance filled out for the year. Informed patient to bring the paperwork by the office so we can fill out the forms. Patient states she will bring it to the office now.

## 2021-07-15 NOTE — Telephone Encounter (Signed)
Patient called requesting to speak with regarding an application she does every year for Dicyclomine medication.

## 2021-07-27 ENCOUNTER — Other Ambulatory Visit: Payer: Self-pay | Admitting: Internal Medicine

## 2021-07-28 NOTE — Telephone Encounter (Signed)
Please refill as per office routine med refill policy (all routine meds to be refilled for 3 mo or monthly (per pt preference) up to one year from last visit, then month to month grace period for 3 mo, then further med refills will have to be denied) ? ?

## 2021-07-29 ENCOUNTER — Other Ambulatory Visit: Payer: Self-pay | Admitting: Internal Medicine

## 2021-07-29 MED ORDER — POTASSIUM CHLORIDE ER 10 MEQ PO TBCR: 10.0000 meq | EXTENDED_RELEASE_TABLET | Freq: Every day | ORAL | 0 refills | Status: DC

## 2021-07-29 NOTE — Addendum Note (Signed)
Addended by: Biagio Borg on: 07/29/2021 04:47 PM   Modules accepted: Orders

## 2021-07-29 NOTE — Telephone Encounter (Signed)
Ok to contact pt - K med refill done 1 mo only - please to make rov for further refills

## 2021-07-29 NOTE — Telephone Encounter (Signed)
Please refill as per office routine med refill policy (all routine meds to be refilled for 3 mo or monthly (per pt preference) up to one year from last visit, then month to month grace period for 3 mo, then further med refills will have to be denied) ? ?

## 2021-07-30 NOTE — Telephone Encounter (Signed)
Called patient and left a message for patient to set up an appt for a f/u. Patient potassium  prescription was sent to the pharmacy. Patient needs an appt to receive additional refills.

## 2021-08-05 ENCOUNTER — Other Ambulatory Visit: Payer: Self-pay

## 2021-08-05 ENCOUNTER — Encounter: Payer: Self-pay | Admitting: Internal Medicine

## 2021-08-05 ENCOUNTER — Ambulatory Visit (INDEPENDENT_AMBULATORY_CARE_PROVIDER_SITE_OTHER): Payer: Medicare Other | Admitting: Internal Medicine

## 2021-08-05 VITALS — BP 124/70 | HR 74 | Temp 99.0°F | Ht 64.5 in | Wt 199.0 lb

## 2021-08-05 DIAGNOSIS — E559 Vitamin D deficiency, unspecified: Secondary | ICD-10-CM

## 2021-08-05 DIAGNOSIS — G5603 Carpal tunnel syndrome, bilateral upper limbs: Secondary | ICD-10-CM

## 2021-08-05 DIAGNOSIS — E119 Type 2 diabetes mellitus without complications: Secondary | ICD-10-CM

## 2021-08-05 DIAGNOSIS — E7800 Pure hypercholesterolemia, unspecified: Secondary | ICD-10-CM | POA: Diagnosis not present

## 2021-08-05 DIAGNOSIS — E538 Deficiency of other specified B group vitamins: Secondary | ICD-10-CM | POA: Diagnosis not present

## 2021-08-05 DIAGNOSIS — I10 Essential (primary) hypertension: Secondary | ICD-10-CM

## 2021-08-05 DIAGNOSIS — Z0001 Encounter for general adult medical examination with abnormal findings: Secondary | ICD-10-CM | POA: Diagnosis not present

## 2021-08-05 LAB — URINALYSIS, ROUTINE W REFLEX MICROSCOPIC
Bilirubin Urine: NEGATIVE
Hgb urine dipstick: NEGATIVE
Ketones, ur: NEGATIVE
Leukocytes,Ua: NEGATIVE
Nitrite: NEGATIVE
RBC / HPF: NONE SEEN (ref 0–?)
Specific Gravity, Urine: 1.025 (ref 1.000–1.030)
Total Protein, Urine: NEGATIVE
Urine Glucose: NEGATIVE
Urobilinogen, UA: 0.2 (ref 0.0–1.0)
pH: 5.5 (ref 5.0–8.0)

## 2021-08-05 LAB — CBC WITH DIFFERENTIAL/PLATELET
Basophils Absolute: 0 10*3/uL (ref 0.0–0.1)
Basophils Relative: 0.5 % (ref 0.0–3.0)
Eosinophils Absolute: 0 10*3/uL (ref 0.0–0.7)
Eosinophils Relative: 0.8 % (ref 0.0–5.0)
HCT: 38.5 % (ref 36.0–46.0)
Hemoglobin: 12.8 g/dL (ref 12.0–15.0)
Lymphocytes Relative: 31 % (ref 12.0–46.0)
Lymphs Abs: 1.2 10*3/uL (ref 0.7–4.0)
MCHC: 33.2 g/dL (ref 30.0–36.0)
MCV: 85.6 fl (ref 78.0–100.0)
Monocytes Absolute: 0.3 10*3/uL (ref 0.1–1.0)
Monocytes Relative: 7.9 % (ref 3.0–12.0)
Neutro Abs: 2.3 10*3/uL (ref 1.4–7.7)
Neutrophils Relative %: 59.8 % (ref 43.0–77.0)
Platelets: 229 10*3/uL (ref 150.0–400.0)
RBC: 4.5 Mil/uL (ref 3.87–5.11)
RDW: 14.2 % (ref 11.5–15.5)
WBC: 3.8 10*3/uL — ABNORMAL LOW (ref 4.0–10.5)

## 2021-08-05 LAB — HEPATIC FUNCTION PANEL
ALT: 30 U/L (ref 0–35)
AST: 28 U/L (ref 0–37)
Albumin: 4 g/dL (ref 3.5–5.2)
Alkaline Phosphatase: 117 U/L (ref 39–117)
Bilirubin, Direct: 0 mg/dL (ref 0.0–0.3)
Total Bilirubin: 0.4 mg/dL (ref 0.2–1.2)
Total Protein: 7.9 g/dL (ref 6.0–8.3)

## 2021-08-05 LAB — VITAMIN D 25 HYDROXY (VIT D DEFICIENCY, FRACTURES): VITD: 75.77 ng/mL (ref 30.00–100.00)

## 2021-08-05 LAB — MICROALBUMIN / CREATININE URINE RATIO
Creatinine,U: 144.5 mg/dL
Microalb Creat Ratio: 0.5 mg/g (ref 0.0–30.0)
Microalb, Ur: 0.7 mg/dL (ref 0.0–1.9)

## 2021-08-05 LAB — BASIC METABOLIC PANEL
BUN: 14 mg/dL (ref 6–23)
CO2: 26 mEq/L (ref 19–32)
Calcium: 9.8 mg/dL (ref 8.4–10.5)
Chloride: 106 mEq/L (ref 96–112)
Creatinine, Ser: 1.06 mg/dL (ref 0.40–1.20)
GFR: 51.02 mL/min — ABNORMAL LOW (ref >60.00)
Glucose, Bld: 100 mg/dL — ABNORMAL HIGH (ref 70–99)
Potassium: 4 mEq/L (ref 3.5–5.1)
Sodium: 139 mEq/L (ref 135–145)

## 2021-08-05 LAB — LIPID PANEL
Cholesterol: 131 mg/dL (ref 0–200)
HDL: 58.8 mg/dL (ref >39.00)
LDL Cholesterol: 53 mg/dL (ref 0–99)
NonHDL: 72.23
Total CHOL/HDL Ratio: 2
Triglycerides: 97 mg/dL (ref 0.0–149.0)
VLDL: 19.4 mg/dL (ref 0.0–40.0)

## 2021-08-05 LAB — TSH: TSH: 1.41 u[IU]/mL (ref 0.35–5.50)

## 2021-08-05 LAB — HEMOGLOBIN A1C: Hgb A1c MFr Bld: 5.8 % (ref 4.6–6.5)

## 2021-08-05 LAB — VITAMIN B12: Vitamin B-12: 896 pg/mL (ref 211–911)

## 2021-08-05 NOTE — Patient Instructions (Signed)
You will be contacted regarding the referral for: hand surgury for the carpal tunnell  Please continue all other medications as before, and refills have been done if requested.  Please have the pharmacy call with any other refills you may need.  Please continue your efforts at being more active, low cholesterol diet, and weight control.  You are otherwise up to date with prevention measures today.  Please keep your appointments with your specialists as you may have planned - Dr Dwyane Dee in May 2023  Please go to the LAB at the blood drawing area for the tests to be done  You will be contacted by phone if any changes need to be made immediately.  Otherwise, you will receive a letter about your results with an explanation, but please check with MyChart first.  Please remember to sign up for MyChart if you have not done so, as this will be important to you in the future with finding out test results, communicating by private email, and scheduling acute appointments online when needed.  Please make an Appointment to return for your 1 year visit, or sooner if needed

## 2021-08-05 NOTE — Progress Notes (Signed)
Patient ID: Daisy Dillon, female   DOB: 04/15/1946, 76 y.o.   MRN: [id number]         Chief Complaint:: wellness exam and bilateral CTS, dm, htn, hld       HPI:  Daisy Dillon is a 76 y.o. female here for wellness exam; plans to call for her GYN appt, declines covid booster, o/w up to date                        Also c/o 1 mo worsening bilateral right > left tingling numb pain to both hands and wrists, now mod to severe, intermittent, worse to use the hands later in the day, better in the AM.  Pt denies chest pain, increased sob or doe, wheezing, orthopnea, PND, increased LE swelling, palpitations, dizziness or syncope.   Pt denies polydipsia, polyuria, or new focal neuro s/s.  Pt denies fever, wt loss, night sweats, loss of appetite, or other constitutional symptoms  no other new complaiints.  Overall good med compliance   Wt Readings from Last 3 Encounters:  08/05/21 199 lb (90.3 kg)  11/05/20 192 lb 8 oz (87.3 kg)  10/18/20 191 lb 6 oz (86.8 kg)   BP Readings from Last 3 Encounters:  08/05/21 124/70  11/05/20 136/68  10/18/20 134/82   Immunization History  Administered Date(s) Administered   Fluad Quad(high Dose 65+) 04/21/2019, 03/26/2020   Influenza Split 04/10/2011   Influenza,inj,Quad PF,6+ Mos 04/12/2012, 04/13/2013, 04/17/2014, 04/19/2015, 04/22/2016, 04/24/2017, 06/10/2018   Influenza-Unspecified 05/07/2021   PFIZER(Purple Top)SARS-COV-2 Vaccination 08/09/2019, 08/29/2019, 04/23/2020, 10/22/2020   Pneumococcal Conjugate-13 04/26/2013   Pneumococcal Polysaccharide-23 05/08/2015   Tdap 04/26/2013   Zoster Recombinat (Shingrix) 07/27/2021   There are no preventive care reminders to display for this patient.     Past Medical History:  Diagnosis Date   Anemia    long ago   Arthritis    Diabetes mellitus    Diverticulosis of colon (without mention of hemorrhage)    Elevated cholesterol    Fibroid    Gastritis    GERD (gastroesophageal reflux disease)     Hiatal hernia    Hypertension    IBS (irritable bowel syndrome)    Positive ANA (antinuclear antibody) 04/23/2012   Stricture and stenosis of esophagus 09/03/2005   Urinary incontinence    Past Surgical History:  Procedure Laterality Date   ABDOMINAL HYSTERECTOMY  1985   BACK SURGERY  11/2016   CHOLECYSTECTOMY     COLONOSCOPY  12/15/2007   diverticulosis   ESOPHAGOGASTRODUODENOSCOPY  12/24/2012   normal    KNEE SURGERY Right    arthroscopic   UPPER GASTROINTESTINAL ENDOSCOPY      reports that she has never smoked. She has never used smokeless tobacco. She reports that she does not drink alcohol and does not use drugs. family history includes Bipolar disorder in her brother; Breast cancer in her maternal aunt; Diabetes in her brother and mother; Hypertension in her mother; Stroke in her brother and brother. Allergies  Allergen Reactions   Codeine Phosphate     REACTION: unspecified, nausea, vomiting   Current Outpatient Medications on File Prior to Visit  Medication Sig Dispense Refill   Alcohol Swabs (B-D SINGLE USE SWABS REGULAR) PADS Use as instructed. 100 each 1   Ascorbic Acid (VITAMIN C) 1000 MG tablet Take 1,000 mg by mouth daily.     aspirin 81 MG tablet Take 81 mg by mouth daily.     bisoprolol-hydrochlorothiazide [facility])  2.5-6.25 MG tablet TAKE 1 TABLET BY MOUTH  DAILY 90 tablet 3   Blood Glucose Monitoring Suppl ([place]) w/Device KIT Use as instructed to check blood sugar once daily. DX:E11.9 1 kit 0   Cholecalciferol (VITAMIN D3) 1000 UNITS CAPS Take 1 capsule by mouth daily.     dexlansoprazole (DEXILANT) 60 MG capsule Take 1 capsule (60 mg total) by mouth daily. 90 capsule 3   dicyclomine (BENTYL) 10 MG capsule Take 2 capsules (20 mg total) by mouth 3 (three) times daily before meals. 540 capsule 1   losartan (COZAAR) 50 MG tablet TAKE 1 TABLET BY MOUTH  DAILY 90 tablet 3   metFORMIN (GLUCOPHAGE) 500 MG tablet TAKE 1 TABLET BY MOUTH  TWICE DAILY  WITH MEALS 180 tablet 3   Multiple Vitamin (MULTIVITAMIN) tablet Take 1 tablet by mouth daily.     ONETOUCH VERIO test strip USE  STRIP TO CHECK GLUCOSE ONCE DAILY 50 each 0   pioglitazone (ACTOS) 15 MG tablet TAKE 1 TABLET BY MOUTH  DAILY 90 tablet 3   potassium chloride (KLOR-CON) 10 MEQ tablet Take 1 tablet (10 mEq total) by mouth daily. 30 tablet 0   pravastatin (PRAVACHOL) 20 MG tablet Take 1 tablet (20 mg total) by mouth daily. 90 tablet 3   verapamil (CALAN-SR) 240 MG CR tablet TAKE 1 TABLET BY MOUTH AT  BEDTIME 90 tablet 3   fexofenadine (ALLEGRA) 180 MG tablet Take 1 tablet (180 mg total) by mouth daily. (Patient taking differently: Take 180 mg by mouth daily. As needed) 30 tablet 2   No current facility-administered medications on file prior to visit.        ROS:  All others reviewed and negative.  Objective        PE:  BP 124/70 (BP Location: Right Arm, Patient Position: Sitting, Cuff Size: Large)    Pulse 74    Temp 99 F (37.2 C) (Oral)    Ht 5' 4.5" (1.638 m)    Wt 199 lb (90.3 kg)    SpO2 97%    BMI 33.63 kg/m                 Constitutional: Pt appears in NAD               HENT: Head: NCAT.                Right Ear: External ear normal.                 Left Ear: External ear normal.                Eyes: . Pupils are equal, round, and reactive to light. Conjunctivae and EOM are normal               Nose: without d/c or deformity               Neck: Neck supple. Gross normal ROM               Cardiovascular: Normal rate and regular rhythm.                 Pulmonary/Chest: Effort normal and breath sounds without rales or wheezing.                Abd:  Soft, NT, ND, + BS, no organomegaly               Neurological: Pt is alert. At baseline orientation, motor  grossly intact but has decreased sens to LT to fingers and right thenar eminence atrophy               Skin: Skin is warm. No rashes, no other new lesions, LE edema - none               Psychiatric: Pt behavior is normal  without agitation   Micro: none  Cardiac tracings I have personally interpreted today:  none  Pertinent Radiological findings (summarize): none   Lab Results  Component Value Date   WBC 3.8 (L) 08/05/2021   HGB 12.8 08/05/2021   HCT 38.5 08/05/2021   PLT 229.0 08/05/2021   GLUCOSE 100 (H) 08/05/2021   CHOL 131 08/05/2021   TRIG 97.0 08/05/2021   HDL 58.80 08/05/2021   LDLCALC 53 08/05/2021   ALT 30 08/05/2021   AST 28 08/05/2021   NA 139 08/05/2021   K 4.0 08/05/2021   CL 106 08/05/2021   CREATININE 1.06 08/05/2021   BUN 14 08/05/2021   CO2 26 08/05/2021   TSH 1.41 08/05/2021   INR 1.12 02/07/2018   HGBA1C 5.8 08/05/2021   MICROALBUR 0.7 08/05/2021   Assessment/Plan:  Trissa Molina Broadhead is a 76 y.o. Black or African American [2] female with  has a past medical history of Anemia, Arthritis, Diabetes mellitus, Diverticulosis of colon (without mention of hemorrhage), Elevated cholesterol, Fibroid, Gastritis, GERD (gastroesophageal reflux disease), Hiatal hernia, Hypertension, IBS (irritable bowel syndrome), Positive ANA (antinuclear antibody) (04/23/2012), Stricture and stenosis of esophagus (09/03/2005), and Urinary incontinence.  Encounter for well adult exam with abnormal findings Age and sex appropriate education and counseling updated with regular exercise and diet Referrals for preventative services - pt to call for her GYN appt Immunizations addressed - declines covid booster Smoking counseling  - none needed Evidence for depression or other mood disorder - none significant Most recent labs reviewed. I have personally reviewed and have noted: 1) the patient's medical and social history 2) The patient's current medications and supplements 3) The patient's height, weight, and BMI have been recorded in the chart   Bilateral carpal tunnel syndrome Worsening in the past month now acute on chronic, for refer hand surgury  Diabetes [facility]) Lab Results  Component  Value Date   HGBA1C 5.8 08/05/2021   Stable, pt to continue current medical treatment actos, metformin   Hyperlipidemia Lab Results  Component Value Date   [place] 53 08/05/2021   Stable, pt to continue current statin pravachol 20   Hypertension BP Readings from Last 3 Encounters:  08/05/21 124/70  11/05/20 136/68  10/18/20 134/82   Stable, pt to continue medical treatment ziac, losartan, calan  Followup: Return in about 1 year (around 08/05/2022).  Cathlean Cower, MD 08/11/2021 7:20 PM [facility]

## 2021-08-11 ENCOUNTER — Encounter: Payer: Self-pay | Admitting: Internal Medicine

## 2021-08-11 NOTE — Assessment & Plan Note (Signed)
Age and sex appropriate education and counseling updated with regular exercise and diet Referrals for preventative services - pt to call for her GYN appt Immunizations addressed - declines covid booster Smoking counseling  - none needed Evidence for depression or other mood disorder - none significant Most recent labs reviewed. I have personally reviewed and have noted: 1) the patient's medical and social history 2) The patient's current medications and supplements 3) The patient's height, weight, and BMI have been recorded in the chart

## 2021-08-11 NOTE — Assessment & Plan Note (Signed)
Lab Results  Component Value Date   HGBA1C 5.8 08/05/2021   Stable, pt to continue current medical treatment actos, metformin

## 2021-08-11 NOTE — Assessment & Plan Note (Signed)
Worsening in the past month now acute on chronic, for refer hand surgury

## 2021-08-11 NOTE — Assessment & Plan Note (Signed)
Lab Results  Component Value Date   LDLCALC 53 08/05/2021   Stable, pt to continue current statin pravachol 20

## 2021-08-11 NOTE — Assessment & Plan Note (Signed)
BP Readings from Last 3 Encounters:  08/05/21 124/70  11/05/20 136/68  10/18/20 134/82   Stable, pt to continue medical treatment ziac, losartan, calan

## 2021-08-13 ENCOUNTER — Encounter: Payer: Self-pay | Admitting: Orthopedic Surgery

## 2021-08-13 ENCOUNTER — Ambulatory Visit: Payer: Medicare Other | Admitting: Orthopedic Surgery

## 2021-08-13 ENCOUNTER — Ambulatory Visit: Payer: Self-pay

## 2021-08-13 ENCOUNTER — Other Ambulatory Visit: Payer: Self-pay

## 2021-08-13 VITALS — BP 133/87 | HR 89

## 2021-08-13 DIAGNOSIS — M25531 Pain in right wrist: Secondary | ICD-10-CM

## 2021-08-13 DIAGNOSIS — M25532 Pain in left wrist: Secondary | ICD-10-CM

## 2021-08-13 NOTE — Progress Notes (Signed)
Office Visit Note   Patient: Daisy Dillon           Date of Birth: 12/29/1945           MRN: [id number] Visit Date: 08/13/2021              Requested by: Biagio Borg, MD [place],  [place] [address] PCP: Biagio Borg, MD   Assessment & Plan: Visit Diagnoses:  1. Bilateral wrist pain     Plan: Discussed with patient the nature of wrist arthritis and reviewed her x-rays which showed bilateral radius scaphoid degeneration with minimal involvement of the radiolunate and midcarpal joints.  We discussed overall treatment strategies for arthritis including anti-inflammatory medications, activity modification, bracing, corticosteroid ejections, and surgery.  At this point, she wants to try a stronger oral anti-inflammatory medication and bilateral wrist braces to be used as needed.   Follow-Up Instructions: No follow-ups on file.   Orders:  Orders Placed This Encounter  Procedures   XR Wrist Complete Right   XR Wrist Complete Left   No orders of the defined types were placed in this encounter.     Procedures: No procedures performed   Clinical Data: No additional findings.   Subjective: Chief Complaint  Patient presents with   Left Hand - New Patient (Initial Visit)   Right Hand - New Patient (Initial Visit)    This is a 76 year old right-hand-dominant female who presents with vague pain involving both hands and wrists.  She has a difficult time localizing her symptoms.  Her right hand seems to be more involved than the left.  This is a chronic issue for her.  When asked to localize her symptoms she points to the dorsal central aspect of the wrists.  She describes her pain as burning, aching pain.  Her wrists and dorsal hands hurt with any activity.  She denies any numbness or tingling tingling in her fingers.  She denies any nocturnal symptoms.  She uses Voltaren gel occasionally.  She wears compression gloves occasionally.  She takes arthritis  strength Tylenol.   Review of Systems   Objective: Vital Signs: BP 133/87 (BP Location: Left Arm, Patient Position: Sitting, Cuff Size: Large)    Pulse 89    SpO2 95%   Physical Exam Constitutional:      Appearance: Normal appearance.  Cardiovascular:     Rate and Rhythm: Normal rate.     Pulses: Normal pulses.  Pulmonary:     Effort: Pulmonary effort is normal.  Skin:    General: Skin is warm and dry.     Capillary Refill: Capillary refill takes less than 2 seconds.  Neurological:     Mental Status: She is alert.    Right Hand Exam   Tenderness  Right hand tenderness location: TTP at dorsal central wrists w/out obvious swelling.  Range of Motion  Wrist  Right wrist extension: 50.  Right wrist flexion: 20.   Other  Erythema: absent Sensation: normal Pulse: present  Comments:  Negative Tinel and Phalen signs. Able to make complete composite fist.    Left Hand Exam   Tenderness  Left hand tenderness location: TTP at dorsal central wrists w/out obvious swelling.   Range of Motion  Wrist  Left wrist extension: 60.  Left wrist flexion: 30.   Other  Erythema: absent Sensation: normal Pulse: present  Comments:  Negative Tinel and Phalen signs. Able to make complete composite fist.      Specialty  Comments:  No specialty comments available.  Imaging: XR Wrist Complete Left  Result Date: 08/13/2021 Multiple views left wrist taken today are reviewed interpreted by me.  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  The radiolunate articulation appears well-maintained.  There are minimal degenerative changes present at the midcarpal joints.  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  The  radiolunate articulation appears well-maintained.  There are minimal degenerative changes present at the midcarpal joints.  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    PMFS History: Patient Active Problem List   Diagnosis Date Noted   Bilateral carpal tunnel syndrome 08/05/2021   Plantar fasciitis 05/10/2020   Hypokalemia 12/31/2019   Right cervical radiculopathy 07/29/2018   Vertigo 02/11/2018   Cough 10/09/2017   Allergic rhinitis 10/09/2017   Paresthesia 11/08/2015   Bilateral hearing loss 01/25/2015   Left otitis media 01/25/2015   Lower back pain 04/26/2013   Pain in toe of left foot 11/11/2012   Positive ANA (antinuclear antibody) 04/23/2012   Encounter for well adult exam with abnormal findings 04/22/2012   Bilateral hand pain 04/22/2012   Hypertension    Fibroid    Urinary incontinence    Hiatal hernia    GERD with stricture 06/19/2011   Globus sensation 06/19/2011   DIVERTICULOSIS, COLON 12/30/2007   HYPERTENSIVE CARDIOVASCULAR DISEASE 10/28/2007   GASTRITIS 10/28/2007   HIATAL HERNIA 10/28/2007   DEGENERATIVE JOINT DISEASE 10/28/2007   Diabetes ([place]) 06/08/2007   Hyperlipidemia 06/08/2007   ANXIETY 06/08/2007   ESOPHAGEAL STRICTURE 06/08/2007   IBS 06/08/2007   Other tenosynovitis of hand and wrist 06/08/2007   Past Medical History:  Diagnosis Date   Anemia    long ago   Arthritis    Diabetes mellitus    Diverticulosis of colon (without mention of hemorrhage)    Elevated cholesterol    Fibroid    Gastritis    GERD (gastroesophageal reflux disease)    Hiatal hernia    Hypertension    IBS (irritable bowel syndrome)    Positive ANA (antinuclear antibody) 04/23/2012   Stricture and stenosis of esophagus 09/03/2005   Urinary incontinence     Family History  Problem Relation Age of Onset   Diabetes Mother    Hypertension Mother    Stroke Brother        x 2   Bipolar disorder Brother    Stroke Brother    Diabetes Brother     Breast cancer Maternal Aunt        Age 48's   Colon cancer Neg Hx    Stomach cancer Neg Hx    Throat cancer Neg Hx    Liver disease Neg Hx    Colon polyps Neg Hx    Esophageal cancer Neg Hx    Rectal cancer Neg Hx     Past Surgical History:  Procedure Laterality Date   ABDOMINAL HYSTERECTOMY  1985   BACK SURGERY  11/2016   CHOLECYSTECTOMY     COLONOSCOPY  12/15/2007   diverticulosis   ESOPHAGOGASTRODUODENOSCOPY  12/24/2012   normal    KNEE SURGERY Right    arthroscopic   UPPER GASTROINTESTINAL ENDOSCOPY     Social History   Occupational History   Occupation: Retired  Tobacco Use   Smoking status: Never  Smokeless tobacco: Never  Vaping Use   Vaping Use: Never used  Substance and Sexual Activity   Alcohol use: No    Alcohol/week: 0.0 standard drinks   Drug use: No   Sexual activity: Not Currently    Birth control/protection: Surgical    Comment: 1ST intercourse- 17, partners- 73, divorced

## 2021-08-16 ENCOUNTER — Telehealth: Payer: Self-pay | Admitting: Orthopedic Surgery

## 2021-08-16 NOTE — Telephone Encounter (Signed)
Patient called asked when will the Rx  and a brace be sent to the pharmacy? The number to contact patient is [phone]  ?

## 2021-08-20 ENCOUNTER — Other Ambulatory Visit: Payer: Self-pay | Admitting: Endocrinology

## 2021-08-29 ENCOUNTER — Other Ambulatory Visit: Payer: Self-pay | Admitting: Internal Medicine

## 2021-08-29 NOTE — Telephone Encounter (Signed)
Please refill as per office routine med refill policy (all routine meds to be refilled for 3 mo or monthly (per pt preference) up to one year from last visit, then month to month grace period for 3 mo, then further med refills will have to be denied) ? ?

## 2021-10-01 ENCOUNTER — Telehealth: Payer: Self-pay | Admitting: Orthopedic Surgery

## 2021-10-01 NOTE — Telephone Encounter (Signed)
Pt called requesting a call back from [place]. Pt did not leave a reason for call back. Pt phone number is [phone]. ?

## 2021-10-02 ENCOUNTER — Other Ambulatory Visit: Payer: Self-pay | Admitting: Orthopedic Surgery

## 2021-10-02 MED ORDER — MELOXICAM 7.5 MG PO TABS: 7.5000 mg | ORAL_TABLET | Freq: Every day | ORAL | 0 refills | Status: AC

## 2021-10-02 NOTE — Telephone Encounter (Signed)
I called and spoke with patient, she states that she was supposed to get a strong anti-inflammatory and some wrist braces and she states that she did not get either. She states that she is taking arthritis strength tylenol and it is not helping. I advised that she could come by the office and I could get her the braces or that she can get them at her local pharmacy but that they need to have the metal bar down the palm side of the wrist. She states that she will get those. ? ?--she is wanting to get a stronger anti-inflammatory--Please advise ?

## 2021-10-02 NOTE — Telephone Encounter (Signed)
I called and advised pt that this was sent in. ?

## 2021-10-10 ENCOUNTER — Encounter: Payer: Self-pay | Admitting: Obstetrics & Gynecology

## 2021-10-10 ENCOUNTER — Ambulatory Visit (INDEPENDENT_AMBULATORY_CARE_PROVIDER_SITE_OTHER): Payer: Medicare Other | Admitting: Obstetrics & Gynecology

## 2021-10-10 VITALS — BP 118/78 | HR 68 | Resp 16 | Ht 64.75 in | Wt 201.0 lb

## 2021-10-10 DIAGNOSIS — Z13820 Encounter for screening for osteoporosis: Secondary | ICD-10-CM

## 2021-10-10 DIAGNOSIS — Z6833 Body mass index (BMI) 33.0-33.9, adult: Secondary | ICD-10-CM

## 2021-10-10 DIAGNOSIS — Z90710 Acquired absence of both cervix and uterus: Secondary | ICD-10-CM

## 2021-10-10 DIAGNOSIS — E6609 Other obesity due to excess calories: Secondary | ICD-10-CM

## 2021-10-10 DIAGNOSIS — Z780 Asymptomatic menopausal state: Secondary | ICD-10-CM

## 2021-10-10 DIAGNOSIS — Z01419 Encounter for gynecological examination (general) (routine) without abnormal findings: Secondary | ICD-10-CM | POA: Diagnosis not present

## 2021-10-10 NOTE — Progress Notes (Signed)
? ? ?Daisy Dillon [birth year] [id number] ? ? ?History:    76 y.o. G2P2L2  Has many grand-children ?  ?RP:  Established patient presenting for annual gyn exam  ?  ?HPI: S/P Total Hysterectomy.  Postmenopausal, well on no hormone replacement therapy.  No pelvic pain.  Abstinent.  Pap Neg 04/2017.  SUI and stool incontinence improved with PT.  Breasts normal.  Mammo Neg 04/2021.  Body mass index 33.71.  Limited physical activity because of back pain.  Health labs with family physician.  Followed by endocrinology for diabetes mellitus type 2 on metformin.  BD normal in 05/2015.  [place] 2019. ? ?Past medical history,surgical history, family history and social history were all reviewed and documented in the EPIC chart. ? ?Gynecologic History ?No LMP recorded. Patient has had a hysterectomy. ? ?Obstetric History ?OB History  ?Gravida Para Term Preterm AB Living  ?'2 2 2     2  '$ ?SAB IAB Ectopic Multiple Live Births  ?           ?  ?# Outcome Date GA Lbr Len/2nd Weight Sex Delivery Anes PTL Lv  ?2 Term           ?1 Term           ? ? ? ?ROS: A ROS was performed and pertinent positives and negatives are included in the history. ? GENERAL: No fevers or chills. HEENT: No change in vision, no earache, sore throat or sinus congestion. NECK: No pain or stiffness. CARDIOVASCULAR: No chest pain or pressure. No palpitations. PULMONARY: No shortness of breath, cough or wheeze. GASTROINTESTINAL: No abdominal pain, nausea, vomiting or diarrhea, melena or bright red blood per rectum. GENITOURINARY: No urinary frequency, urgency, hesitancy or dysuria. MUSCULOSKELETAL: No joint or muscle pain, no back pain, no recent trauma. DERMATOLOGIC: No rash, no itching, no lesions. ENDOCRINE: No polyuria, polydipsia, no heat or cold intolerance. No recent change in weight. HEMATOLOGICAL: No anemia or easy bruising or bleeding. NEUROLOGIC: No headache, seizures, numbness, tingling or weakness. PSYCHIATRIC: No depression, no loss of interest in  normal activity or change in sleep pattern.  ?  ? ?Exam: ? ? ?BP 118/78   Pulse 68   Resp 16   Ht 5' 4.75" (1.645 m)   Wt 201 lb (91.2 kg)   BMI 33.71 kg/m?  ? ?Body mass index is 33.71 kg/m?. ? ?General appearance : Well developed well nourished female. No acute distress ?HEENT: Eyes: no retinal hemorrhage or exudates,  Neck supple, trachea midline, no carotid bruits, no thyroidmegaly ?Lungs: Clear to auscultation, no rhonchi or wheezes, or rib retractions  ?Heart: Regular rate and rhythm, no murmurs or gallops ?Breast:Examined in sitting and supine position were symmetrical in appearance, no palpable masses or tenderness,  no skin retraction, no nipple inversion, no nipple discharge, no skin discoloration, no axillary or supraclavicular lymphadenopathy ?Abdomen: no palpable masses or tenderness, no rebound or guarding ?Extremities: no edema or skin discoloration or tenderness ? ?Pelvic: Vulva: Normal ?            Vagina: No gross lesions or discharge ? Cervix/Uterus absent ? Adnexa  Without masses or tenderness ? Anus: Normal ? ? ?Assessment/Plan:  76 y.o. female for annual exam  ? ?1. Well female exam with routine gynecological exam ?S/P Total Hysterectomy.  Postmenopausal, well on no hormone replacement therapy.  No pelvic pain.  Abstinent.  Pap Neg 04/2017.  SUI and stool incontinence improved with PT.  Breasts normal.  Mammo Neg 04/2021.  Body mass index 33.71.  Limited physical activity because of back pain.  Health labs with family physician.  Followed by endocrinology for diabetes mellitus type 2 on metformin.  BD normal in 05/2015.  [place] 2019. ? ?2. H/O total hysterectomy ? ?3. Postmenopausal ?S/P Total Hysterectomy.  Postmenopausal, well on no hormone replacement therapy.  No pelvic pain.  Abstinent.   ? ?4. Screening for osteoporosis ?Last bone density was normal in December 2016.  We will repeat a bone density now here. ?- DG Bone Density; Future ? ?5. Class 1 obesity due to excess calories with  serious comorbidity and body mass index (BMI) of 33.0 to 33.9 in adult  ?Lower calorie/carb diet.  Increase physical activities as much as possible. ? ?Princess Bruins MD, 11:00 AM 10/10/2021 ? ?  ?

## 2021-10-15 ENCOUNTER — Other Ambulatory Visit: Payer: Self-pay | Admitting: Obstetrics & Gynecology

## 2021-10-15 ENCOUNTER — Ambulatory Visit (INDEPENDENT_AMBULATORY_CARE_PROVIDER_SITE_OTHER): Payer: Medicare Other

## 2021-10-15 DIAGNOSIS — Z13820 Encounter for screening for osteoporosis: Secondary | ICD-10-CM

## 2021-10-15 DIAGNOSIS — Z780 Asymptomatic menopausal state: Secondary | ICD-10-CM

## 2021-10-22 ENCOUNTER — Other Ambulatory Visit (INDEPENDENT_AMBULATORY_CARE_PROVIDER_SITE_OTHER): Payer: Medicare Other

## 2021-10-22 ENCOUNTER — Other Ambulatory Visit: Payer: Self-pay | Admitting: Endocrinology

## 2021-10-22 DIAGNOSIS — E119 Type 2 diabetes mellitus without complications: Secondary | ICD-10-CM

## 2021-10-22 LAB — BASIC METABOLIC PANEL
BUN: 20 mg/dL (ref 6–23)
CO2: 23 mEq/L (ref 19–32)
Calcium: 9.5 mg/dL (ref 8.4–10.5)
Chloride: 105 mEq/L (ref 96–112)
Creatinine, Ser: 1.06 mg/dL (ref 0.40–1.20)
GFR: 50.95 mL/min — ABNORMAL LOW (ref >60.00)
Glucose, Bld: 91 mg/dL (ref 70–99)
Potassium: 3.8 mEq/L (ref 3.5–5.1)
Sodium: 138 mEq/L (ref 135–145)

## 2021-10-22 LAB — HEMOGLOBIN A1C: Hgb A1c MFr Bld: 5.7 % (ref 4.6–6.5)

## 2021-10-24 ENCOUNTER — Ambulatory Visit: Payer: Medicare Other | Admitting: Endocrinology

## 2021-10-27 ENCOUNTER — Other Ambulatory Visit: Payer: Self-pay | Admitting: Endocrinology

## 2021-10-27 DIAGNOSIS — E7800 Pure hypercholesterolemia, unspecified: Secondary | ICD-10-CM

## 2021-11-01 ENCOUNTER — Encounter: Payer: Self-pay | Admitting: Endocrinology

## 2021-11-01 ENCOUNTER — Ambulatory Visit: Payer: Medicare Other | Admitting: Endocrinology

## 2021-11-01 ENCOUNTER — Other Ambulatory Visit: Payer: Self-pay | Admitting: Endocrinology

## 2021-11-01 VITALS — BP 138/80 | HR 85 | Ht 65.0 in | Wt 202.0 lb

## 2021-11-01 DIAGNOSIS — I10 Essential (primary) hypertension: Secondary | ICD-10-CM | POA: Diagnosis not present

## 2021-11-01 DIAGNOSIS — E7800 Pure hypercholesterolemia, unspecified: Secondary | ICD-10-CM | POA: Diagnosis not present

## 2021-11-01 DIAGNOSIS — E119 Type 2 diabetes mellitus without complications: Secondary | ICD-10-CM | POA: Diagnosis not present

## 2021-11-01 NOTE — Progress Notes (Signed)
Patient ID: Daisy Dillon, female   DOB: 08/07/1945, 76 y.o.   MRN: [id number]   Reason for Appointment: follow-up   History of Present Illness   Diagnosis: Type 2 DIABETES MELITUS, date of diagnosis:   2002   She has had stable long-term control of her mild diabetes with combination of low-doses of Actos and metformin 500mg   She usually has had a normal A1c and this stays in the 5.7- 6% range  A1c is consistently below 6% and now 5.7    Current management, blood sugar patterns and history:  She is back for her annual follow-up Even with only taking 500 mg of metformin her fasting readings appear to be excellent, this was reduced on her last visit to make it convenient for her to take it once a day No side effects like edema from Actos She usually does not check her morning readings until at least 10 AM as she has a late breakfast Weight has gone up minimally Lab glucose also normal at 91 Still having significant back pain and cannot do much walking or other exercises  Side effects from medications: None  Monitors blood glucose: Less than once a day .    Glucometer: One touch     Blood Glucose readings and averages from download:  RANGE 89-127, previously 76-120 for the last 30 days MEDIAN 100     Wt Readings from Last 3 Encounters:  11/01/21 202 lb (91.6 kg)  10/10/21 201 lb (91.2 kg)  08/05/21 199 lb (90.3 kg)    Lab Results  Component Value Date   HGBA1C 5.7 10/22/2021   HGBA1C 5.8 08/05/2021   HGBA1C 5.7 10/11/2020   Lab Results  Component Value Date   MICROALBUR 0.7 08/05/2021   LDLCALC 53 08/05/2021   CREATININE 1.06 10/22/2021    OTHER active problems: See review of systems   No visits with results within 1 Week(s) from this visit.  Latest known visit with results is:  Lab on 10/22/2021  Component Date Value Ref Range Status   Sodium 10/22/2021 138  135 - 145 mEq/L Final   Potassium 10/22/2021 3.8  3.5 - 5.1 mEq/L Final    Chloride 10/22/2021 105  96 - 112 mEq/L Final   CO2 10/22/2021 23  19 - 32 mEq/L Final   Glucose, Bld 10/22/2021 91  70 - 99 mg/dL Final   BUN 12/22/2021 20  6 - 23 mg/dL Final   Creatinine, Ser 10/22/2021 1.06  0.40 - 1.20 mg/dL Final   GFR 12/22/2021 50.95 (L)  >60.00 mL/min Final   Calculated using the CKD-EPI Creatinine Equation (2021)   Calcium 10/22/2021 9.5  8.4 - 10.5 mg/dL Final   Hgb 12/22/2021 MFr Bld 10/22/2021 5.7  4.6 - 6.5 % Final   Glycemic Control Guidelines for People with Diabetes:Non Diabetic:  <6%Goal of Therapy: <7%Additional Action Suggested:  >8%     Allergies as of 11/01/2021       Reactions   Codeine Phosphate    REACTION: unspecified, nausea, vomiting        Medication List        Accurate as of Nov 01, 2021 11:40 AM. If you have any questions, ask your nurse or doctor.          aspirin 81 MG tablet Take 81 mg by mouth daily.   bisoprolol-hydrochlorothiazide 2.5-6.25 MG tablet Commonly known as: ZIAC TAKE 1 TABLET BY MOUTH  DAILY   dexlansoprazole 60 MG capsule Commonly known as: Dexilant  Take 1 capsule (60 mg total) by mouth daily.   dicyclomine 10 MG capsule Commonly known as: BENTYL Take 2 capsules (20 mg total) by mouth 3 (three) times daily before meals.   fexofenadine 180 MG tablet Commonly known as: ALLEGRA Take 1 tablet (180 mg total) by mouth daily. What changed: additional instructions   losartan 50 MG tablet Commonly known as: COZAAR TAKE 1 TABLET BY MOUTH  DAILY   meloxicam 7.5 MG tablet Commonly known as: Mobic Take 1 tablet (7.5 mg total) by mouth daily.   metFORMIN 500 MG tablet Commonly known as: GLUCOPHAGE TAKE 1 TABLET BY MOUTH  TWICE DAILY WITH MEALS What changed:  how much to take how to take this when to take this   multivitamin tablet Take 1 tablet by mouth daily.   OneTouch Verio Flex System w/Device Kit Use as instructed to check blood sugar once daily. DX:E11.9   OneTouch Verio test strip Generic  drug: glucose blood USE 1 STRIP TO CHECK GLUCOSE ONCE DAILY   pioglitazone 15 MG tablet Commonly known as: ACTOS TAKE 1 TABLET BY MOUTH  DAILY   potassium chloride 10 MEQ tablet Commonly known as: KLOR-CON TAKE 1 TABLET BY MOUTH ONCE DAILY . APPOINTMENT REQUIRED FOR FUTURE REFILLS   pravastatin 20 MG tablet Commonly known as: PRAVACHOL TAKE 1 TABLET BY MOUTH  DAILY   verapamil 240 MG CR tablet Commonly known as: CALAN-SR TAKE 1 TABLET BY MOUTH AT  BEDTIME   vitamin C 1000 MG tablet Take 1,000 mg by mouth daily.   Vitamin D3 25 MCG (1000 UT) Caps Take 1 capsule by mouth daily.        Allergies:  Allergies  Allergen Reactions   Codeine Phosphate     REACTION: unspecified, nausea, vomiting    Past Medical History:  Diagnosis Date   Anemia    long ago   Arthritis    Diabetes mellitus    Diverticulosis of colon (without mention of hemorrhage)    Elevated cholesterol    Fibroid    Gastritis    GERD (gastroesophageal reflux disease)    Hiatal hernia    Hypertension    IBS (irritable bowel syndrome)    Positive ANA (antinuclear antibody) 04/23/2012   Stricture and stenosis of esophagus 09/03/2005   Urinary incontinence     Past Surgical History:  Procedure Laterality Date   ABDOMINAL HYSTERECTOMY  1985   BACK SURGERY  11/2016   CHOLECYSTECTOMY     COLONOSCOPY  12/15/2007   diverticulosis   ESOPHAGOGASTRODUODENOSCOPY  12/24/2012   normal    KNEE SURGERY Right    arthroscopic   UPPER GASTROINTESTINAL ENDOSCOPY      Family History  Problem Relation Age of Onset   Diabetes Mother    Hypertension Mother    Stroke Brother        x 2   Bipolar disorder Brother    Stroke Brother    Diabetes Brother    Breast cancer Maternal Aunt        Age 70's   Colon cancer Neg Hx    Stomach cancer Neg Hx    Throat cancer Neg Hx    Liver disease Neg Hx    Colon polyps Neg Hx    Esophageal cancer Neg Hx    Rectal cancer Neg Hx     Social History:  reports that  she has never smoked. She has never used smokeless tobacco. She reports that she does not drink alcohol and does not  use drugs.  Review of Systems:  HYPERTENSION:   She is continuing losartan 50 mg, Verapamil 240 mg and Ziac 2.5 Blood pressure is consistently controlled Also followed by PCP She does take her verapamil at bedtime instead of with food  Home BP checked minimally as her monitor has not worked  BP Readings from Last 3 Encounters:  11/01/21 138/80  10/10/21 118/78  08/13/21 133/87   No change in renal function or electrolytes  Lab Results  Component Value Date   CREATININE 1.06 10/22/2021   BUN 20 10/22/2021   NA 138 10/22/2021   K 3.8 10/22/2021   CL 105 10/22/2021   CO2 23 10/22/2021    HYPERLIPIDEMIA: She has had hypercholesterolemia, LDL is well-controlled on pravastatin 20 mg Recently  Lab Results  Component Value Date   CHOL 131 08/05/2021   HDL 58.80 08/05/2021   LDLCALC 53 08/05/2021   TRIG 97.0 08/05/2021   CHOLHDL 2 08/05/2021     Last eye exam was 05/2019  Last foot exam 04/2020    Examination:   BP 138/80   Pulse 85   Ht $R'5\' 5"'nq$  (1.651 m)   Wt 202 lb (91.6 kg)   SpO2 97%   BMI 33.61 kg/m   Body mass index is 33.61 kg/m.   No ankle edema present  ASSESSMENT/ PLAN:   Diabetes type 2 with  obesity  Her A1c is consistently below 6% and now 5.7  She is on metformin 1000 mg a day and  15 mg Actos for several years She is tolerating these well and blood sugars are near normal at home Usually watching her diet and generally maintaining her weight  Hypertension: Consistently controlled with bisoprolol/HCT 2.5 mg, verapamil and losartan 50 mg She will continue the same medications She needs to start checking periodically at home    Lipids: Well-controlled with pravastatin 20 mg that she has taken long-term  Follow-up with PCP for chronic care and will see her here as needed   Elayne Snare 11/01/2021, 11:40 AM

## 2021-11-16 ENCOUNTER — Other Ambulatory Visit: Payer: Self-pay | Admitting: Gastroenterology

## 2021-11-18 ENCOUNTER — Other Ambulatory Visit: Payer: Self-pay | Admitting: Gastroenterology

## 2021-11-29 ENCOUNTER — Telehealth: Payer: Self-pay | Admitting: Internal Medicine

## 2021-11-29 MED ORDER — ONETOUCH VERIO VI STRP: ORAL_STRIP | 11 refills | Status: DC

## 2021-11-29 NOTE — Telephone Encounter (Signed)
Pt called requesting a refill on  [facility] [place] [facility] [place] Phone:  [phone]  Fax:  [phone]

## 2021-11-29 NOTE — Telephone Encounter (Signed)
Prescription sent to pharmacy.

## 2021-12-08 ENCOUNTER — Other Ambulatory Visit: Payer: Self-pay | Admitting: Gastroenterology

## 2021-12-08 ENCOUNTER — Other Ambulatory Visit: Payer: Self-pay | Admitting: Endocrinology

## 2021-12-12 ENCOUNTER — Ambulatory Visit: Payer: Medicare Other | Admitting: Physician Assistant

## 2021-12-12 ENCOUNTER — Encounter: Payer: Self-pay | Admitting: Physician Assistant

## 2021-12-12 VITALS — BP 130/70 | HR 76 | Ht 64.5 in | Wt 200.2 lb

## 2021-12-12 DIAGNOSIS — K449 Diaphragmatic hernia without obstruction or gangrene: Secondary | ICD-10-CM | POA: Diagnosis not present

## 2021-12-12 DIAGNOSIS — K219 Gastro-esophageal reflux disease without esophagitis: Secondary | ICD-10-CM

## 2021-12-12 DIAGNOSIS — K5730 Diverticulosis of large intestine without perforation or abscess without bleeding: Secondary | ICD-10-CM | POA: Diagnosis not present

## 2021-12-12 DIAGNOSIS — K580 Irritable bowel syndrome with diarrhea: Secondary | ICD-10-CM

## 2021-12-12 MED ORDER — DICYCLOMINE HCL 10 MG PO CAPS: ORAL_CAPSULE | ORAL | 4 refills | Status: DC

## 2021-12-12 NOTE — Patient Instructions (Addendum)
If you are age 77 or older, your body mass index should be between 23-30. Your Body mass index is 33.84 kg/m. If this is out of the aforementioned range listed, please consider follow up with your Primary Care Provider. ________________________________________________________  The [facility] providers would like to encourage you to use [facility] to communicate with providers for non-urgent requests or questions.  Due to long hold times on the telephone, sending your provider a message by [facility] may be a faster and more efficient way to get a response.  Please allow 48 business hours for a response.  Please remember that this is for non-urgent requests.  _______________________________________________________   Your Dicyclomine 10 mg has been refilled.  Follow up in 1 year with Dr. Fuller Plan or sooner if needed.  Thank you for entrusting me with your care and choosing [facility].  Amy Esterwood, PA-C

## 2021-12-12 NOTE — Progress Notes (Signed)
Subjective:    Patient ID: Daisy Dillon, female    DOB: [birth year], 76 y.o.   MRN: [id number]  HPI  Daisy Dillon is a pleasant 76 year old female, established with Dr. Fuller Plan who comes in today for medication refills.  She was last seen here in May 2022.  She has history of GERD, IBS, diverticular disease. Last colonoscopy was done in September 2019 with removal of two 5 to 6 mm polyps, noted to have moderate diverticulosis and internal hemorrhoids.  Path on the polyps consistent with hyperplastic polyps. Last EGD 2014, empiric Maloney dilation, normal-appearing esophagus, mild gastropathy, urease negative. Patient has been maintained on Dexilant 60 mg p.o. daily for chronic GERD and says this works well.  She is not having any issues with heartburn or indigestion.  She very occasionally will have some mild dysphagia type symptoms with very dry foods like bread but other than that is not having any significant dysphagia to solids pills or liquids. She also takes Bentyl 20 mg 3-4 times daily as needed for IBS symptoms.  She says most of the time she takes it 3 times a day before meals very occasionally will have an episode of diarrhea most of her bowel movements have been normal.  No other concerns or complaints today   Review of Systems Pertinent positive and negative review of systems were noted in the above HPI section.  All other review of systems was otherwise negative.   Outpatient Encounter Medications as of 12/12/2021  Medication Sig   Ascorbic Acid (VITAMIN C) 1000 MG tablet Take 1,000 mg by mouth daily.   aspirin 81 MG tablet Take 81 mg by mouth daily.   bisoprolol-hydrochlorothiazide (ZIAC) 2.5-6.25 MG tablet TAKE 1 TABLET BY MOUTH  DAILY   Blood Glucose Monitoring Suppl ([place]) w/Device KIT Use as instructed to check blood sugar once daily. DX:E11.9   Cholecalciferol (VITAMIN D3) 1000 UNITS CAPS Take 1 capsule by mouth daily.   dexlansoprazole (DEXILANT) 60 MG  capsule Take 1 capsule (60 mg total) by mouth daily.   glucose blood (ONETOUCH VERIO) test strip USE 1 STRIP TO CHECK GLUCOSE ONCE DAILY   losartan (COZAAR) 50 MG tablet TAKE 1 TABLET BY MOUTH  DAILY   metFORMIN (GLUCOPHAGE) 500 MG tablet TAKE 1 TABLET BY MOUTH  TWICE DAILY WITH MEALS (Patient taking differently: daily.)   Multiple Vitamin (MULTIVITAMIN) tablet Take 1 tablet by mouth daily.   pioglitazone (ACTOS) 15 MG tablet TAKE 1 TABLET BY MOUTH  DAILY   potassium chloride (KLOR-CON) 10 MEQ tablet TAKE 1 TABLET BY MOUTH ONCE DAILY . APPOINTMENT REQUIRED FOR FUTURE REFILLS   pravastatin (PRAVACHOL) 20 MG tablet TAKE 1 TABLET BY MOUTH  DAILY   verapamil (CALAN-SR) 240 MG CR tablet TAKE 1 TABLET BY MOUTH AT  BEDTIME   [DISCONTINUED] dicyclomine (BENTYL) 10 MG capsule TAKE 2 CAPSULES BY MOUTH 3 TIMES DAILY BEFORE MEALS   dicyclomine (BENTYL) 10 MG capsule TAKE 2 CAPSULES BY MOUTH 3 TIMES DAILY BEFORE MEALS   fexofenadine (ALLEGRA) 180 MG tablet Take 1 tablet (180 mg total) by mouth daily. (Patient taking differently: Take 180 mg by mouth daily. As needed)   [DISCONTINUED] dicyclomine (BENTYL) 10 MG capsule TAKE 2 CAPSULES BY MOUTH 3 TIMES DAILY BEFORE MEALS   No facility-administered encounter medications on file as of 12/12/2021.   Allergies  Allergen Reactions   Codeine Phosphate     REACTION: unspecified, nausea, vomiting   Patient Active Problem List   Diagnosis Date Noted  Bilateral carpal tunnel syndrome 08/05/2021   Plantar fasciitis 05/10/2020   Hypokalemia 12/31/2019   Right cervical radiculopathy 07/29/2018   Vertigo 02/11/2018   Cough 10/09/2017   Allergic rhinitis 10/09/2017   Paresthesia 11/08/2015   Bilateral hearing loss 01/25/2015   Left otitis media 01/25/2015   Lower back pain 04/26/2013   Pain in toe of left foot 11/11/2012   Positive ANA (antinuclear antibody) 04/23/2012   Encounter for well adult exam with abnormal findings 04/22/2012   Bilateral hand pain  04/22/2012   Hypertension    Fibroid    Urinary incontinence    Hiatal hernia    GERD with stricture 06/19/2011   Globus sensation 06/19/2011   DIVERTICULOSIS, COLON 12/30/2007   HYPERTENSIVE CARDIOVASCULAR DISEASE 10/28/2007   GASTRITIS 10/28/2007   HIATAL HERNIA 10/28/2007   DEGENERATIVE JOINT DISEASE 10/28/2007   Diabetes ([place]) 06/08/2007   Hyperlipidemia 06/08/2007   ANXIETY 06/08/2007   ESOPHAGEAL STRICTURE 06/08/2007   IBS 06/08/2007   Other tenosynovitis of hand and wrist 06/08/2007   Social History   Socioeconomic History   Marital status: Divorced    Spouse name: Not on file   Number of children: 2   Years of education: 13.5   Highest education level: Not on file  Occupational History   Occupation: Retired  Tobacco Use   Smoking status: Never   Smokeless tobacco: Never  Vaping Use   Vaping Use: Never used  Substance and Sexual Activity   Alcohol use: No    Alcohol/week: 0.0 standard drinks of alcohol   Drug use: No   Sexual activity: Not Currently    Birth control/protection: Surgical    Comment: 1ST intercourse- 17, partners- 47, hysterectomy  Other Topics Concern   Not on file  Social History Narrative   Lives at home w/ grandson   Right-handed   2 caffeinated drinks per day   Social Determinants of Health   Financial Resource Strain: Not on file  Food Insecurity: Not on file  Transportation Needs: Not on file  Physical Activity: Not on file  Stress: Not on file  Social Connections: Not on file  Intimate Partner Violence: Not on file    Daisy Dillon's family history includes Bipolar disorder in her brother; Breast cancer in her maternal aunt; Diabetes in her brother and mother; Hypertension in her mother; Other in her father; Stroke in her brother and brother.      Objective:    Vitals:   12/12/21 0907  BP: 130/70  Pulse: 76    Physical Exam Well-developed well-nourished elderly female  in no acute distress.  Height, Weight, 200 BMI  33.8  HEENT; nontraumatic normocephalic, EOMI, PE R LA, sclera anicteric. Oropharynx; not examined today Neck; supple, no JVD Cardiovascular; regular rate and rhythm with S1-S2, no murmur rub or gallop Pulmonary; Clear bilaterally Abdomen; soft, nontender, nondistended, no palpable mass or hepatosplenomegaly, bowel sounds are active Rectal; not done today Skin; benign exam, no jaundice rash or appreciable lesions Extremities; no clubbing cyanosis or edema skin warm and dry Neuro/Psych; alert and oriented x4, grossly nonfocal mood and affect appropriate        Assessment & Plan:   #77 76 year old female with chronic GERD, stable on Dexilant 60 mg p.o. every morning for maintenance. #2 IBS-well-controlled with dicyclomine 20 mg 3 times daily as needed #3 colon cancer surveillance-up-to-date last colonoscopy September 2019 2 hyperplastic polyps #4 diverticulosis #5 internal hemorrhoids #6.  Hypertension #7.  Diabetes mellitus  Plan; refill Dexilant 60 mg p.o.  every morning #90/4 refills Refill dicyclomine 20 mg p.o. 3 times daily as needed #274 refills Follow-up in 1 year, happy to see her in the interim for problems     Alfredia Ferguson PA-C 12/12/2021   Cc: Biagio Borg, MD

## 2022-01-21 ENCOUNTER — Ambulatory Visit: Payer: Self-pay | Admitting: Licensed Clinical Social Worker

## 2022-01-21 NOTE — Patient Outreach (Signed)
  Care Coordination   01/21/2022 Name: Daisy Dillon MRN: [id number] DOB: 03-Apr-1946   Care Coordination Outreach Attempts:  Contact was made with the patient today to offer care coordination services as a benefit of their health plan. The patient requested a return call on a later date.   Follow Up Plan:  Additional outreach attempts will be made to offer the patient care coordination information and services.  Appointment scheduled 01/23/22  Encounter Outcome:  Pt. Scheduled  Care Coordination Interventions Activated:  No   Care Coordination Interventions:  No, not indicated    Casimer Lanius, [place] [phone]

## 2022-01-22 DIAGNOSIS — E119 Type 2 diabetes mellitus without complications: Secondary | ICD-10-CM | POA: Diagnosis not present

## 2022-01-23 ENCOUNTER — Ambulatory Visit: Payer: Self-pay | Admitting: Licensed Clinical Social Worker

## 2022-01-23 NOTE — Patient Instructions (Signed)
Visit Information  Thank you for taking time to visit with me today. Please don't hesitate to contact me if I can be of assistance to you.   Following are the goals we discussed today: Managing your health risk  Goals Addressed             This Visit's Progress    COMPLETED: Care Coordincation Activities/ No Follow up Required       Care Coordination Interventions: Discussed benefits of Medicare Annual Wellness Visit ,appointment scheduled           Please call the care guide team at [phone] if you need to schedule a phone appointment with me.   If you are experiencing a Mental Health or [place] or need someone to talk to, please call [phone]-TALK (toll free, 24 hour hotline)   Patient verbalizes understanding of instructions and care plan provided today and agrees to view in [place]. Active MyChart status and patient understanding of how to access instructions and care plan via MyChart confirmed with patient.     Next AWV (Annual Wellness Visit) scheduled for: Aug. 18th  No further follow up required: care coordination   Casimer Lanius, [place] [phone]

## 2022-01-23 NOTE — Patient Outreach (Signed)
  Care Coordination  Initial Visit Note   01/23/2022 Name: Criselda Starke Hale MRN: [id number] DOB: Jan 06, 1946  Jolisa Intriago Braver is a 76 y.o. year old female who sees Biagio Borg, MD for primary care. I spoke with  Jimmy Picket Mcwherter by phone today  What matters to the patients health and wellness today?  Getting rid of cough that keeps coming back  Assessed patient's needs, support system and barriers to care.  Reviewed benefits of Medicare Annual Wellness Visit.   Recommendation: Patient may benefit from, and is in agreement to schedule AWV.     Goals Addressed             This Visit's Progress    COMPLETED: Care Coordincation Activities/ No Follow up Required       Care Coordination Interventions: Discussed benefits of Medicare Annual Wellness Visit ,appointment scheduled           SDOH assessments and interventions completed:  Yes  SDOH Interventions Today    Flowsheet Row Most Recent Value  SDOH Interventions   Housing Interventions Intervention Not Indicated  Stress Interventions Intervention Not Indicated  Transportation Interventions Intervention Not Indicated       Care Coordination Interventions Activated:  Yes  Care Coordination Interventions:  Yes, provided   Follow up plan: No further intervention required.   Encounter Outcome:  Pt. Visit Completed   Casimer Lanius, [place] [phone]

## 2022-01-31 ENCOUNTER — Ambulatory Visit (INDEPENDENT_AMBULATORY_CARE_PROVIDER_SITE_OTHER): Payer: Medicare Other | Admitting: *Deleted

## 2022-01-31 ENCOUNTER — Telehealth: Payer: Self-pay | Admitting: *Deleted

## 2022-01-31 VITALS — Ht 64.5 in | Wt 199.0 lb

## 2022-01-31 DIAGNOSIS — Z0000 Encounter for general adult medical examination without abnormal findings: Secondary | ICD-10-CM

## 2022-01-31 MED ORDER — BENZONATATE 100 MG PO CAPS: ORAL_CAPSULE | ORAL | 1 refills | Status: AC

## 2022-01-31 NOTE — Progress Notes (Signed)
Subjective:   Daisy Dillon is a 76 y.o. female who presents for an Initial Medicare Annual Wellness Visit. I connected with  Riyah Bardon Dusenbury on 01/31/22 by a audio enabled telemedicine application and verified that I am speaking with the correct person using two identifiers.  Patient Location: Home  Provider Location: Office/Clinic  I discussed the limitations of evaluation and management by telemedicine. The patient expressed understanding and agreed to proceed.   Review of Systems    Defer to PCP   Cardiac Risk Factors include: hypertension;diabetes mellitus;obesity (BMI >30kg/m2)    See problem list for additional risk factors  Objective:    Today's Vitals   01/31/22 1106  Weight: 199 lb (90.3 kg)  Height: 5' 4.5" (1.638 m)   Body mass index is 33.63 kg/m.     01/31/2022   11:15 AM 02/14/2021   10:36 AM 10/02/2016    1:09 PM 11/02/2015    5:23 PM  Advanced Directives  Does Patient Have a Medical Advance Directive? No No No No  Would patient like information on creating a medical advance directive? No - Patient declined No - Patient declined Yes (MAU/Ambulatory/Procedural Areas - Information given) No - patient declined information    Current Medications (verified) Outpatient Encounter Medications as of 01/31/2022  Medication Sig   Ascorbic Acid (VITAMIN C) 1000 MG tablet Take 1,000 mg by mouth daily.   aspirin 81 MG tablet Take 81 mg by mouth daily.   Blood Glucose Monitoring Suppl (ONETOUCH VERIO FLEX SYSTEM) w/Device KIT Use as instructed to check blood sugar once daily. DX:E11.9   Cholecalciferol (VITAMIN D3) 1000 UNITS CAPS Take 1 capsule by mouth daily.   dexlansoprazole (DEXILANT) 60 MG capsule Take 1 capsule (60 mg total) by mouth daily.   dicyclomine (BENTYL) 10 MG capsule TAKE 2 CAPSULES BY MOUTH 3 TIMES DAILY BEFORE MEALS   glucose blood (ONETOUCH VERIO) test strip USE 1 STRIP TO CHECK GLUCOSE ONCE DAILY   losartan (COZAAR) 50 MG tablet TAKE 1  TABLET BY MOUTH  DAILY   metFORMIN (GLUCOPHAGE) 500 MG tablet TAKE 1 TABLET BY MOUTH  TWICE DAILY WITH MEALS (Patient taking differently: daily.)   Multiple Vitamin (MULTIVITAMIN) tablet Take 1 tablet by mouth daily.   pioglitazone (ACTOS) 15 MG tablet TAKE 1 TABLET BY MOUTH  DAILY   potassium chloride (KLOR-CON) 10 MEQ tablet TAKE 1 TABLET BY MOUTH ONCE DAILY . APPOINTMENT REQUIRED FOR FUTURE REFILLS   pravastatin (PRAVACHOL) 20 MG tablet TAKE 1 TABLET BY MOUTH  DAILY   verapamil (CALAN-SR) 240 MG CR tablet TAKE 1 TABLET BY MOUTH AT  BEDTIME   bisoprolol-hydrochlorothiazide (ZIAC) 2.5-6.25 MG tablet TAKE 1 TABLET BY MOUTH  DAILY   fexofenadine (ALLEGRA) 180 MG tablet Take 1 tablet (180 mg total) by mouth daily. (Patient taking differently: Take 180 mg by mouth daily. As needed)   No facility-administered encounter medications on file as of 01/31/2022.    Allergies (verified) Codeine phosphate   History: Past Medical History:  Diagnosis Date   Anemia    long ago   Arthritis    Diabetes mellitus    Diverticulosis of colon (without mention of hemorrhage)    Elevated cholesterol    Fibroid    Gastritis    GERD (gastroesophageal reflux disease)    Hiatal hernia    Hypertension    IBS (irritable bowel syndrome)    Positive ANA (antinuclear antibody) 04/23/2012   Stricture and stenosis of esophagus 09/03/2005   Urinary incontinence  Past Surgical History:  Procedure Laterality Date   ABDOMINAL HYSTERECTOMY  1985   BACK SURGERY  11/2016   CHOLECYSTECTOMY     COLONOSCOPY  12/15/2007   diverticulosis   ESOPHAGOGASTRODUODENOSCOPY  12/24/2012   normal    KNEE SURGERY Right    arthroscopic   UPPER GASTROINTESTINAL ENDOSCOPY     Family History  Problem Relation Age of Onset   Diabetes Mother    Hypertension Mother    Other Father        on the job accident   Stroke Brother        x 2   Bipolar disorder Brother    Stroke Brother    Diabetes Brother    Breast cancer  Maternal Aunt        Age 48's   Colon cancer Neg Hx    Stomach cancer Neg Hx    Throat cancer Neg Hx    Liver disease Neg Hx    Colon polyps Neg Hx    Esophageal cancer Neg Hx    Rectal cancer Neg Hx    Social History   Socioeconomic History   Marital status: Divorced    Spouse name: Not on file   Number of children: 2   Years of education: 13.5   Highest education level: Not on file  Occupational History   Occupation: Retired  Tobacco Use   Smoking status: Never   Smokeless tobacco: Never  Vaping Use   Vaping Use: Never used  Substance and Sexual Activity   Alcohol use: No    Alcohol/week: 0.0 standard drinks of alcohol   Drug use: No   Sexual activity: Not Currently    Birth control/protection: Surgical    Comment: 1ST intercourse- 17, partners- 80, hysterectomy  Other Topics Concern   Not on file  Social History Narrative   Lives at home w/ grandson   Right-handed   2 caffeinated drinks per day   Social Determinants of Health   Financial Resource Strain: Low Risk  (01/31/2022)   Overall Financial Resource Strain (CARDIA)    Difficulty of Paying Living Expenses: Not hard at all  Food Insecurity: No Food Insecurity (01/31/2022)   Hunger Vital Sign    Worried About Running Out of Food in the Last Year: Never true    Ran Out of Food in the Last Year: Never true  Transportation Needs: No Transportation Needs (01/31/2022)   PRAPARE - Hydrologist (Medical): No    Lack of Transportation (Non-Medical): No  Physical Activity: Inactive (01/31/2022)   Exercise Vital Sign    Days of Exercise per Week: 0 days    Minutes of Exercise per Session: 0 min  Stress: No Stress Concern Present (01/31/2022)   [place]    Feeling of Stress : Not at all  Social Connections: Moderately Isolated (01/31/2022)   Social Connection and Isolation Panel [NHANES]    Frequency of Communication with  Friends and Family: More than three times a week    Frequency of Social Gatherings with Friends and Family: More than three times a week    Attends Religious Services: 1 to 4 times per year    Active Member of [employer] or Organizations: No    Attends Archivist Meetings: Never    Marital Status: Divorced    Tobacco Counseling Counseling given: Not Answered   Clinical Intake:  Pre-visit preparation completed: Yes  Pain : No/denies pain  Nutritional Status: BMI > 30  Obese Nutritional Risks: None Diabetes: Yes CBG done?: No Did pt. bring in CBG monitor from home?: No  How often do you need to have someone help you when you read instructions, pamphlets, or other written materials from your doctor or pharmacy?: 1 - Never What is the last grade level you completed in school?: 1 year of college  Diabetic?Nutrition Risk Assessment:  Has the patient had any N/V/D within the last 2 months?  No  Does the patient have any non-healing wounds?  No  Has the patient had any unintentional weight loss or weight gain?  No   Diabetes:  Is the patient diabetic?  Yes  If diabetic, was a CBG obtained today?  No  Did the patient bring in their glucometer from home?  No  How often do you monitor your CBG's? Twice a week .   Financial Strains and Diabetes Management:  Are you having any financial strains with the device, your supplies or your medication? No .  Does the patient want to be seen by Chronic Care Management for management of their diabetes?  No  Would the patient like to be referred to a Nutritionist or for Diabetic Management?  No   Diabetic Exams:  Diabetic Eye Exam: Completed 01/23/2022 Diabetic Foot Exam: Completed 08/05/2021    Interpreter Needed?: No      Activities of Daily Living    01/31/2022   11:21 AM 01/31/2022   11:16 AM  In your present state of health, do you have any difficulty performing the following activities:  Hearing?  0  Vision?  0   Difficulty concentrating or making decisions?  0  Walking or climbing stairs?  1  Dressing or bathing?  0  Doing errands, shopping?  0  Preparing Food and eating ? N   Using the Toilet? N   In the past six months, have you accidently leaked urine? Y   Do you have problems with loss of bowel control? N   Managing your Medications? N   Managing your Finances? N   Housekeeping or managing your Housekeeping? N     Patient Care Team: Biagio Borg, MD as PCP - General  Indicate any recent Medical Services you may have received from other than [facility] providers in the past year (date may be approximate).     Assessment:   This is a routine wellness examination for Merrilee.  Hearing/Vision screen No results found.  Dietary issues and exercise activities discussed: Current Exercise Habits: The patient does not participate in regular exercise at present, Exercise limited by: orthopedic condition(s)   Goals Addressed   None   Depression Screen    01/31/2022   11:09 AM 01/23/2022    2:07 PM 08/05/2021   11:35 AM 08/05/2021   11:14 AM 07/05/2020   11:42 AM 07/05/2020   11:16 AM 07/01/2019    1:59 PM  PHQ 2/9 Scores  PHQ - 2 Score 0 1 0 2 0 0 0  PHQ- 9 Score    2       Fall Risk    01/31/2022   11:09 AM 08/05/2021   11:35 AM 08/05/2021   11:14 AM 07/05/2020   11:42 AM 12/29/2019    1:47 PM  Fall Risk   Falls in the past year? 0 0 0 0 0  Number falls in past yr: 0 0 0  0  Injury with Fall? 0 0 0  0  Risk for fall  due to : No Fall Risks    No Fall Risks  Follow up Falls evaluation completed    Falls evaluation completed    [place]:  Any stairs in or around the home? No  If so, are there any without handrails? No  Home free of loose throw rugs in walkways, pet beds, electrical cords, etc? No  Adequate lighting in your home to reduce risk of falls? Yes   ASSISTIVE DEVICES UTILIZED TO PREVENT FALLS:  Life alert? No  Use of a cane, walker or w/c?  Yes  Grab bars in the bathroom? Yes  Shower chair or bench in shower? Yes  Elevated toilet seat or a handicapped toilet? Yes   TIMED UP AND GO:  Was the test performed? No .  Length of time to ambulate 10 feet: n/a sec.    Cognitive Function:        01/31/2022   11:16 AM  6CIT Screen  What Year? 0 points  What month? 0 points  What time? 0 points  Count back from 20 0 points  Months in reverse 0 points  Repeat phrase 0 points  Total Score 0 points    Immunizations Immunization History  Administered Date(s) Administered   Fluad Quad(high Dose 65+) 04/21/2019, 03/26/2020   Influenza Split 04/10/2011   Influenza,inj,Quad PF,6+ Mos 04/12/2012, 04/13/2013, 04/17/2014, 04/19/2015, 04/22/2016, 04/24/2017, 06/10/2018   Influenza-Unspecified 05/07/2021   PFIZER(Purple Top)SARS-COV-2 Vaccination 08/09/2019, 08/29/2019, 04/23/2020, 10/22/2020   Pneumococcal Conjugate-13 04/26/2013   Pneumococcal Polysaccharide-23 05/08/2015   Tdap 04/26/2013   Zoster Recombinat (Shingrix) 07/27/2021    TDAP status: Up to date  Flu Vaccine status: Up to date  Pneumococcal vaccine status: Up to date  Covid-19 vaccine status: Completed vaccines  Qualifies for Shingles Vaccine? Yes   Zostavax completed No   Shingrix Completed?: No.    Education has been provided regarding the importance of this vaccine. Patient has been advised to call insurance company to determine out of pocket expense if they have not yet received this vaccine. Advised may also receive vaccine at local pharmacy or Health Dept. Verbalized acceptance and understanding.  Screening Tests Health Maintenance  Topic Date Due   PAP SMEAR-Modifier  04/25/2019   COVID-19 Vaccine (5 - Pfizer series) 12/17/2020   Zoster Vaccines- Shingrix (2 of 2) 09/21/2021   INFLUENZA VACCINE  09/14/2022 (Originally 01/14/2022)   HEMOGLOBIN A1C  04/24/2022   OPHTHALMOLOGY EXAM  06/10/2022   Diabetic kidney evaluation - Urine ACR  08/05/2022    FOOT EXAM  08/05/2022   Diabetic kidney evaluation - GFR measurement  10/23/2022   TETANUS/TDAP  04/27/2023   Pneumonia Vaccine 82+ Years old  Completed   DEXA SCAN  Completed   Hepatitis C Screening  Completed   HPV VACCINES  Aged Out    Health Maintenance  Health Maintenance Due  Topic Date Due   PAP SMEAR-Modifier  04/25/2019   COVID-19 Vaccine (5 - Pfizer series) 12/17/2020   Zoster Vaccines- Shingrix (2 of 2) 09/21/2021    Colorectal cancer screening: No longer required.   Mammogram status: Completed 04/23/2021. Repeat every year  Bone Density status: Completed 10/15/2021. Results reflect: Bone density results: NORMAL. Repeat every unknown years.  Lung Cancer Screening: (Low Dose CT Chest recommended if Age 75-80 years, 30 pack-year currently smoking OR have quit w/in 15years.) does not qualify.   Lung Cancer Screening Referral: n/a   Additional Screening:  Hepatitis C Screening: does not qualify; Completed 08/05/2006  Vision  Screening: Recommended annual ophthalmology exams for early detection of glaucoma and other disorders of the eye. Is the patient up to date with their annual eye exam?  Yes  Who is the provider or what is the name of the office in which the patient attends annual eye exams? [facility]  If pt is not established with a provider, would they like to be referred to a provider to establish care? No .   Dental Screening: Recommended annual dental exams for proper oral hygiene  Community Resource Referral / Chronic Care Management: CRR required this visit?  No   CCM required this visit?  No      Plan:     I have personally reviewed and noted the following in the patient's chart:   Medical and social history Use of alcohol, tobacco or illicit drugs  Current medications and supplements including opioid prescriptions. Patient is not currently taking opioid prescriptions. Functional ability and status Nutritional status Physical  activity Advanced directives List of other physicians Hospitalizations, surgeries, and ER visits in previous 12 months Vitals Screenings to include cognitive, depression, and falls Referrals and appointments  In addition, I have reviewed and discussed with patient certain preventive protocols, quality metrics, and best practice recommendations. A written personalized care plan for preventive services as well as general preventive health recommendations were provided to patient.     Mylinda Latina, [place]   01/31/2022   Nurse Notes:  Ms. Kun , Thank you for taking time to come for your Medicare Wellness Visit. I appreciate your ongoing commitment to your health goals. Please review the following plan we discussed and let me know if I can assist you in the future.   These are the goals we discussed:  Goals   None     This is a list of the screening recommended for you and due dates:  Health Maintenance  Topic Date Due   Pap Smear  04/25/2019   COVID-19 Vaccine (5 - Pfizer series) 12/17/2020   Zoster (Shingles) Vaccine (2 of 2) 09/21/2021   Flu Shot  09/14/2022*   Hemoglobin A1C  04/24/2022   Eye exam for diabetics  06/10/2022   Yearly kidney health urinalysis for diabetes  08/05/2022   Complete foot exam   08/05/2022   Yearly kidney function blood test for diabetes  10/23/2022   Tetanus Vaccine  04/27/2023   Pneumonia Vaccine  Completed   DEXA scan (bone density measurement)  Completed   Hepatitis C Screening: USPSTF Recommendation to screen - Ages 61-79 yo.  Completed   HPV Vaccine  Aged Out  *Topic was postponed. The date shown is not the original due date.

## 2022-01-31 NOTE — Telephone Encounter (Signed)
Called patient to inform him of provider recommendation  

## 2022-01-31 NOTE — Telephone Encounter (Signed)
Ok I sent tessalon perle as needed to [facility]  But also try otc zyrtec and nasacort as well, as allergies with post nasal drip and cough are Very Common

## 2022-01-31 NOTE — Telephone Encounter (Signed)
Patient states she has been experiencing a cough for a few months that she can't get rid of. Patient has took OTC cough medication that has not helped. Pt is requesting something to help. Please advise if appt is needed or meds can be called in

## 2022-01-31 NOTE — Patient Instructions (Signed)

## 2022-04-03 ENCOUNTER — Other Ambulatory Visit: Payer: Self-pay | Admitting: Endocrinology

## 2022-04-24 ENCOUNTER — Other Ambulatory Visit: Payer: Self-pay | Admitting: Obstetrics & Gynecology

## 2022-04-24 DIAGNOSIS — Z1231 Encounter for screening mammogram for malignant neoplasm of breast: Secondary | ICD-10-CM

## 2022-05-05 ENCOUNTER — Telehealth: Payer: Self-pay | Admitting: Internal Medicine

## 2022-05-05 NOTE — Telephone Encounter (Signed)
Caller & Relationship to patient:Daisy Dillon   Call back number:[phone]   Date of last office visit:08/05/21   Date of next office visit:08/07/22   Medication(s) to be refilled:  losartan (COZAAR) 50 MG tablet  2.   pioglitazone (ACTOS) 15 MG tablet  Patient is out  Patient said she talked to the pharmacy last week and they were supposed to send request      Preferred Pharmacy: [facility]

## 2022-05-06 MED ORDER — PIOGLITAZONE HCL 15 MG PO TABS: 15.0000 mg | ORAL_TABLET | Freq: Every day | ORAL | 0 refills | Status: DC

## 2022-05-06 MED ORDER — LOSARTAN POTASSIUM 50 MG PO TABS
50.0000 mg | ORAL_TABLET | Freq: Every day | ORAL | 0 refills | Status: DC
Start: 2022-05-06 — End: 2022-08-08

## 2022-05-06 NOTE — Telephone Encounter (Signed)
Ok, meds are sent for 3 mo; needs ROV for further refills

## 2022-05-06 NOTE — Telephone Encounter (Signed)
Medication was prescribed by endocrinologist Dr.Kumar but was told by him to receive them you. Please advise for clarification.

## 2022-06-03 ENCOUNTER — Telehealth: Payer: Self-pay | Admitting: Internal Medicine

## 2022-06-03 NOTE — Telephone Encounter (Signed)
Patient called and would like to know if she needs to get the RSV vaccine. She would like a call back at [phone].

## 2022-06-04 NOTE — Telephone Encounter (Signed)
This is a good vaccine, but the risk is low if you are not around younger person such as under 77yo, so it may not really be needed due to low risk of exposure, pt would have to decide    thanks

## 2022-06-04 NOTE — Telephone Encounter (Signed)
Patient wants your advice on getting RSV vaccine and if it is recommended

## 2022-06-05 NOTE — Telephone Encounter (Signed)
Spoke with patient about the low risk for RSV, she states she still may get it due to being a diabetic.

## 2022-06-12 ENCOUNTER — Telehealth: Payer: Self-pay | Admitting: Pharmacy Technician

## 2022-06-12 NOTE — Telephone Encounter (Signed)
Paper received. Clinic staff route for signature.

## 2022-06-20 ENCOUNTER — Telehealth: Payer: Self-pay | Admitting: Pharmacy Technician

## 2022-06-20 NOTE — Telephone Encounter (Signed)
Submitted Patient Assistance Application to  [employer]  for DEXILANT.  Fax# [phone] Phone# [phone]

## 2022-06-27 ENCOUNTER — Ambulatory Visit
Admission: RE | Admit: 2022-06-27 | Discharge: 2022-06-27 | Disposition: A | Discharge status: Home / Self Care | Payer: Medicare Other | Source: Ambulatory Visit | Attending: Obstetrics & Gynecology | Admitting: Obstetrics & Gynecology

## 2022-06-27 DIAGNOSIS — Z1231 Encounter for screening mammogram for malignant neoplasm of breast: Secondary | ICD-10-CM | POA: Diagnosis not present

## 2022-07-01 ENCOUNTER — Other Ambulatory Visit: Payer: Self-pay | Admitting: Obstetrics & Gynecology

## 2022-07-01 DIAGNOSIS — R928 Other abnormal and inconclusive findings on diagnostic imaging of breast: Secondary | ICD-10-CM

## 2022-07-07 ENCOUNTER — Ambulatory Visit
Admission: RE | Admit: 2022-07-07 | Discharge: 2022-07-07 | Disposition: A | Discharge status: Home / Self Care | Payer: Medicare Other | Source: Ambulatory Visit | Attending: Obstetrics & Gynecology | Admitting: Obstetrics & Gynecology

## 2022-07-07 ENCOUNTER — Other Ambulatory Visit: Payer: Self-pay | Admitting: Obstetrics & Gynecology

## 2022-07-07 DIAGNOSIS — R928 Other abnormal and inconclusive findings on diagnostic imaging of breast: Secondary | ICD-10-CM

## 2022-07-07 DIAGNOSIS — R921 Mammographic calcification found on diagnostic imaging of breast: Secondary | ICD-10-CM | POA: Diagnosis not present

## 2022-07-07 DIAGNOSIS — N6310 Unspecified lump in the right breast, unspecified quadrant: Secondary | ICD-10-CM

## 2022-07-07 DIAGNOSIS — N6312 Unspecified lump in the right breast, upper inner quadrant: Secondary | ICD-10-CM | POA: Diagnosis not present

## 2022-07-14 ENCOUNTER — Telehealth: Payer: Self-pay | Admitting: Internal Medicine

## 2022-07-14 NOTE — Telephone Encounter (Signed)
Patient called and would like a callback in reference to some medications she needs and wanting it mailed to her. The best callback number is [phone].

## 2022-07-15 MED ORDER — METFORMIN HCL 500 MG PO TABS: 500.0000 mg | ORAL_TABLET | Freq: Every day | ORAL | 0 refills | Status: DC

## 2022-07-15 NOTE — Telephone Encounter (Signed)
Pty has appt 08/07/22 w/MD../lmb

## 2022-07-15 NOTE — Telephone Encounter (Signed)
LOV 08/05/21, patient is requesting refill for Metformin last prescirbed by Dr.Kumar who informed her to change dose to 1tab once a day instead of 1tab twice a day. Please advise, I did inform patient she would have to be seen for further refills. Please send to [facility]

## 2022-07-15 NOTE — Telephone Encounter (Signed)
Ok I did refill to [facility], but please ask pt to make roV for further refills

## 2022-07-21 ENCOUNTER — Other Ambulatory Visit: Payer: Self-pay | Admitting: *Deleted

## 2022-07-21 MED ORDER — METFORMIN HCL 500 MG PO TABS: 500.0000 mg | ORAL_TABLET | Freq: Every day | ORAL | 0 refills | Status: DC

## 2022-07-21 MED ORDER — PIOGLITAZONE HCL 15 MG PO TABS: 15.0000 mg | ORAL_TABLET | Freq: Every day | ORAL | 0 refills | Status: DC

## 2022-07-21 NOTE — Addendum Note (Signed)
Addended by: Earnstine Regal on: 07/21/2022 11:29 AM   Modules accepted: Orders

## 2022-07-21 NOTE — Telephone Encounter (Signed)
Called pt inform her MD sent refill to [facility] . Pt states she need meds to go to her mail service. She states she also need actos. Inform pt will resend, but per chart have appt on 08/07/22 they will do refills at that time.Marland KitchenChryl Heck

## 2022-07-21 NOTE — Telephone Encounter (Signed)
PT calls again today regarding this RX. PT wanted to inform us that they would like this sent out to [facility] ([place], [place] as PT does not currently have the capabilities to pick this prescription up from the [facility].   I informed PT that with the mail order pharmacies she might have to wait longer for this prescription. She stated she did mind the wait and that there had been times where it was sent the following day.  CB: [phone]

## 2022-07-25 ENCOUNTER — Other Ambulatory Visit: Payer: Self-pay

## 2022-07-25 MED ORDER — METFORMIN HCL 500 MG PO TABS: 500.0000 mg | ORAL_TABLET | Freq: Every day | ORAL | 0 refills | Status: DC

## 2022-08-07 ENCOUNTER — Ambulatory Visit (INDEPENDENT_AMBULATORY_CARE_PROVIDER_SITE_OTHER): Payer: Medicare Other

## 2022-08-07 ENCOUNTER — Ambulatory Visit (INDEPENDENT_AMBULATORY_CARE_PROVIDER_SITE_OTHER): Payer: Medicare Other | Admitting: Internal Medicine

## 2022-08-07 VITALS — BP 124/70 | HR 88 | Temp 97.7°F | Ht 64.5 in | Wt 198.0 lb

## 2022-08-07 DIAGNOSIS — I10 Essential (primary) hypertension: Secondary | ICD-10-CM | POA: Diagnosis not present

## 2022-08-07 DIAGNOSIS — E559 Vitamin D deficiency, unspecified: Secondary | ICD-10-CM | POA: Diagnosis not present

## 2022-08-07 DIAGNOSIS — E538 Deficiency of other specified B group vitamins: Secondary | ICD-10-CM | POA: Diagnosis not present

## 2022-08-07 DIAGNOSIS — E7800 Pure hypercholesterolemia, unspecified: Secondary | ICD-10-CM

## 2022-08-07 DIAGNOSIS — Z0001 Encounter for general adult medical examination with abnormal findings: Secondary | ICD-10-CM | POA: Diagnosis not present

## 2022-08-07 DIAGNOSIS — R053 Chronic cough: Secondary | ICD-10-CM

## 2022-08-07 DIAGNOSIS — E119 Type 2 diabetes mellitus without complications: Secondary | ICD-10-CM

## 2022-08-07 DIAGNOSIS — R059 Cough, unspecified: Secondary | ICD-10-CM | POA: Diagnosis not present

## 2022-08-07 LAB — VITAMIN B12: Vitamin B-12: 800 pg/mL (ref 211–911)

## 2022-08-07 LAB — CBC WITH DIFFERENTIAL/PLATELET
Basophils Absolute: 0 10*3/uL (ref 0.0–0.1)
Basophils Relative: 0.6 % (ref 0.0–3.0)
Eosinophils Absolute: 0 10*3/uL (ref 0.0–0.7)
Eosinophils Relative: 1.1 % (ref 0.0–5.0)
HCT: 41 % (ref 36.0–46.0)
Hemoglobin: 13.5 g/dL (ref 12.0–15.0)
Lymphocytes Relative: 34.9 % (ref 12.0–46.0)
Lymphs Abs: 1.1 10*3/uL (ref 0.7–4.0)
MCHC: 32.9 g/dL (ref 30.0–36.0)
MCV: 87.6 fl (ref 78.0–100.0)
Monocytes Absolute: 0.2 10*3/uL (ref 0.1–1.0)
Monocytes Relative: 7.1 % (ref 3.0–12.0)
Neutro Abs: 1.8 10*3/uL (ref 1.4–7.7)
Neutrophils Relative %: 56.3 % (ref 43.0–77.0)
Platelets: 232 10*3/uL (ref 150.0–400.0)
RBC: 4.68 Mil/uL (ref 3.87–5.11)
RDW: 13.9 % (ref 11.5–15.5)
WBC: 3.1 10*3/uL — ABNORMAL LOW (ref 4.0–10.5)

## 2022-08-07 LAB — HEPATIC FUNCTION PANEL
ALT: 31 U/L (ref 0–35)
AST: 29 U/L (ref 0–37)
Albumin: 4.2 g/dL (ref 3.5–5.2)
Alkaline Phosphatase: 98 U/L (ref 39–117)
Bilirubin, Direct: 0.1 mg/dL (ref 0.0–0.3)
Total Bilirubin: 0.3 mg/dL (ref 0.2–1.2)
Total Protein: 7.9 g/dL (ref 6.0–8.3)

## 2022-08-07 LAB — URINALYSIS, ROUTINE W REFLEX MICROSCOPIC
Bilirubin Urine: NEGATIVE
Hgb urine dipstick: NEGATIVE
Leukocytes,Ua: NEGATIVE
Nitrite: NEGATIVE
Specific Gravity, Urine: >=1.03 — AB (ref 1.000–1.030)
Total Protein, Urine: NEGATIVE
Urine Glucose: NEGATIVE
Urobilinogen, UA: 0.2 (ref 0.0–1.0)
pH: 5.5 (ref 5.0–8.0)

## 2022-08-07 LAB — BASIC METABOLIC PANEL
BUN: 20 mg/dL (ref 6–23)
CO2: 25 mEq/L (ref 19–32)
Calcium: 10.1 mg/dL (ref 8.4–10.5)
Chloride: 106 mEq/L (ref 96–112)
Creatinine, Ser: 1.16 mg/dL (ref 0.40–1.20)
GFR: 45.47 mL/min — ABNORMAL LOW (ref >60.00)
Glucose, Bld: 86 mg/dL (ref 70–99)
Potassium: 3.8 mEq/L (ref 3.5–5.1)
Sodium: 141 mEq/L (ref 135–145)

## 2022-08-07 LAB — MICROALBUMIN / CREATININE URINE RATIO
Creatinine,U: 254 mg/dL
Microalb Creat Ratio: 0.4 mg/g (ref 0.0–30.0)
Microalb, Ur: 1 mg/dL (ref 0.0–1.9)

## 2022-08-07 LAB — HEMOGLOBIN A1C: Hgb A1c MFr Bld: 5.8 % (ref 4.6–6.5)

## 2022-08-07 LAB — LIPID PANEL
Cholesterol: 154 mg/dL (ref 0–200)
HDL: 64.4 mg/dL (ref >39.00)
LDL Cholesterol: 71 mg/dL (ref 0–99)
NonHDL: 89.29
Total CHOL/HDL Ratio: 2
Triglycerides: 90 mg/dL (ref 0.0–149.0)
VLDL: 18 mg/dL (ref 0.0–40.0)

## 2022-08-07 LAB — TSH: TSH: 1.99 u[IU]/mL (ref 0.35–5.50)

## 2022-08-07 LAB — VITAMIN D 25 HYDROXY (VIT D DEFICIENCY, FRACTURES): VITD: 61.81 ng/mL (ref 30.00–100.00)

## 2022-08-07 NOTE — Patient Instructions (Signed)
Please continue all other medications as before, and refills have been done if requested.  Please have the pharmacy call with any other refills you may need.  Please continue your efforts at being more active, low cholesterol diet, and weight control.  You are otherwise up to date with prevention measures today.  Please keep your appointments with your specialists as you may have planned  Please go to the XRAY Department in the first floor for the x-ray testing  Please go to the LAB at the blood drawing area for the tests to be done  You will be contacted by phone if any changes need to be made immediately.  Otherwise, you will receive a letter about your results with an explanation, but please check with MyChart first.  Please remember to sign up for MyChart if you have not done so, as this will be important to you in the future with finding out test results, communicating by private email, and scheduling acute appointments online when needed.  Please make an Appointment to return in 6 months, or sooner if needed

## 2022-08-07 NOTE — Progress Notes (Signed)
Patient ID: Daisy Dillon, female   DOB: 05-02-1946, 77 y.o.   MRN: [id number]         Chief Complaint:: wellness exam and cough, dm, hld, htn       HPI:  Daisy Dillon is a 77 y.o. female here for wellness exam, declines pap, and covid booster, o/w up to date                        Also Pt denies chest pain, increased sob or doe, wheezing, orthopnea, PND, increased LE swelling, palpitations, dizziness or syncope.   Pt denies polydipsia, polyuria, or new focal neuro s/s.    Pt denies fever, wt loss, night sweats, loss of appetite, or other constitutional symptoms  Does have cough non prod x 2 wks for unclear reason, denies sinus congestion, wheezing, or reflux.   Pt denies fever, wt loss, night sweats, loss of appetite, or other constitutional symptoms     Wt Readings from Last 3 Encounters:  08/07/22 198 lb (89.8 kg)  01/31/22 199 lb (90.3 kg)  12/12/21 200 lb 4 oz (90.8 kg)   BP Readings from Last 3 Encounters:  08/07/22 124/70  12/12/21 130/70  11/01/21 138/80   Immunization History  Administered Date(s) Administered   Fluad Quad(high Dose 65+) 04/21/2019, 03/26/2020   Influenza Split 04/10/2011   Influenza,inj,Quad PF,6+ Mos 04/12/2012, 04/13/2013, 04/17/2014, 04/19/2015, 04/22/2016, 04/24/2017, 06/10/2018   Influenza-Unspecified 05/07/2021, 04/11/2022   PFIZER(Purple Top)SARS-COV-2 Vaccination 08/09/2019, 08/29/2019, 04/23/2020, 10/22/2020, 04/11/2022   Pneumococcal Conjugate-13 04/26/2013   Pneumococcal Polysaccharide-23 05/08/2015   Tdap 04/26/2013   Zoster Recombinat (Shingrix) 07/27/2021, 10/07/2021   Health Maintenance Due  Topic Date Due   PAP SMEAR-Modifier  04/25/2019      Past Medical History:  Diagnosis Date   Anemia    long ago   Arthritis    Diabetes mellitus    Diverticulosis of colon (without mention of hemorrhage)    Elevated cholesterol    Fibroid    Gastritis    GERD (gastroesophageal reflux disease)    Hiatal hernia     Hypertension    IBS (irritable bowel syndrome)    Positive ANA (antinuclear antibody) 04/23/2012   Stricture and stenosis of esophagus 09/03/2005   Urinary incontinence    Past Surgical History:  Procedure Laterality Date   ABDOMINAL HYSTERECTOMY  1985   BACK SURGERY  11/2016   CHOLECYSTECTOMY     COLONOSCOPY  12/15/2007   diverticulosis   ESOPHAGOGASTRODUODENOSCOPY  12/24/2012   normal    KNEE SURGERY Right    arthroscopic   UPPER GASTROINTESTINAL ENDOSCOPY      reports that she has never smoked. She has never used smokeless tobacco. She reports that she does not drink alcohol and does not use drugs. family history includes Bipolar disorder in her brother; Breast cancer in her maternal aunt; Diabetes in her brother and mother; Hypertension in her mother; Other in her father; Stroke in her brother and brother. Allergies  Allergen Reactions   Codeine Phosphate     REACTION: unspecified, nausea, vomiting   Current Outpatient Medications on File Prior to Visit  Medication Sig Dispense Refill   Ascorbic Acid (VITAMIN C) 1000 MG tablet Take 1,000 mg by mouth daily.     aspirin 81 MG tablet Take 81 mg by mouth daily.     benzonatate (TESSALON PERLES) 100 MG capsule 1-2 tab by mouth every 8 hrs as needed for cough 60 capsule 1   bisoprolol-hydrochlorothiazide (  ZIAC) 2.5-6.25 MG tablet TAKE 1 TABLET BY MOUTH  DAILY 90 tablet 3   Blood Glucose Monitoring Suppl ([place]) w/Device KIT Use as instructed to check blood sugar once daily. DX:E11.9 1 kit 0   Cholecalciferol (VITAMIN D3) 1000 UNITS CAPS Take 1 capsule by mouth daily.     dexlansoprazole (DEXILANT) 60 MG capsule Take 1 capsule (60 mg total) by mouth daily. 90 capsule 3   dicyclomine (BENTYL) 10 MG capsule TAKE 2 CAPSULES BY MOUTH 3 TIMES DAILY BEFORE MEALS 540 capsule 4   glucose blood (ONETOUCH VERIO) test strip USE 1 STRIP TO CHECK GLUCOSE ONCE DAILY 50 each 11   metFORMIN (GLUCOPHAGE) 500 MG tablet Take 1  tablet (500 mg total) by mouth daily with breakfast. 90 tablet 0   Multiple Vitamin (MULTIVITAMIN) tablet Take 1 tablet by mouth daily.     pioglitazone (ACTOS) 15 MG tablet Take 1 tablet (15 mg total) by mouth daily. 90 tablet 0   potassium chloride (KLOR-CON) 10 MEQ tablet TAKE 1 TABLET BY MOUTH ONCE DAILY . APPOINTMENT REQUIRED FOR FUTURE REFILLS 90 tablet 3   pravastatin (PRAVACHOL) 20 MG tablet TAKE 1 TABLET BY MOUTH  DAILY 90 tablet 3   verapamil (CALAN-SR) 240 MG CR tablet TAKE 1 TABLET BY MOUTH AT  BEDTIME 90 tablet 3   fexofenadine (ALLEGRA) 180 MG tablet Take 1 tablet (180 mg total) by mouth daily. (Patient taking differently: Take 180 mg by mouth daily. As needed) 30 tablet 2   No current facility-administered medications on file prior to visit.        ROS:  All others reviewed and negative.  Objective        PE:  BP 124/70 (BP Location: Right Arm, Patient Position: Sitting, Cuff Size: Large)   Pulse 88   Temp 97.7 F (36.5 C) (Oral)   Ht 5' 4.5" (1.638 m)   Wt 198 lb (89.8 kg)   SpO2 95%   BMI 33.46 kg/m                 Constitutional: Pt appears in NAD               HENT: Head: NCAT.                Right Ear: External ear normal.                 Left Ear: External ear normal.                Eyes: . Pupils are equal, round, and reactive to light. Conjunctivae and EOM are normal               Nose: without d/c or deformity               Neck: Neck supple. Gross normal ROM               Cardiovascular: Normal rate and regular rhythm.                 Pulmonary/Chest: Effort normal and breath sounds without rales or wheezing.                Abd:  Soft, NT, ND, + BS, no organomegaly               Neurological: Pt is alert. At baseline orientation, motor grossly intact               Skin: Skin is  warm. No rashes, no other new lesions, LE edema - none               Psychiatric: Pt behavior is normal without agitation   Micro: none  Cardiac tracings I have personally  interpreted today:  none  Pertinent Radiological findings (summarize): none   Lab Results  Component Value Date   WBC 3.1 (L) 08/07/2022   HGB 13.5 08/07/2022   HCT 41.0 08/07/2022   PLT 232.0 08/07/2022   GLUCOSE 86 08/07/2022   CHOL 154 08/07/2022   TRIG 90.0 08/07/2022   HDL 64.40 08/07/2022   LDLCALC 71 08/07/2022   ALT 31 08/07/2022   AST 29 08/07/2022   NA 141 08/07/2022   K 3.8 08/07/2022   CL 106 08/07/2022   CREATININE 1.16 08/07/2022   BUN 20 08/07/2022   CO2 25 08/07/2022   TSH 1.99 08/07/2022   INR 1.12 02/07/2018   HGBA1C 5.8 08/07/2022   MICROALBUR 1.0 08/07/2022   Assessment/Plan:  Daisy Dillon is a 77 y.o. Black or African American [2] female with  has a past medical history of Anemia, Arthritis, Diabetes mellitus, Diverticulosis of colon (without mention of hemorrhage), Elevated cholesterol, Fibroid, Gastritis, GERD (gastroesophageal reflux disease), Hiatal hernia, Hypertension, IBS (irritable bowel syndrome), Positive ANA (antinuclear antibody) (04/23/2012), Stricture and stenosis of esophagus (09/03/2005), and Urinary incontinence.  Encounter for well adult exam with abnormal findings Age and sex appropriate education and counseling updated with regular exercise and diet Referrals for preventative services - declines GYN for pap Immunizations addressed - declines covid booster Smoking counseling  - none needed Evidence for depression or other mood disorder - none significant Most recent labs reviewed. I have personally reviewed and have noted: 1) the patient's medical and social history 2) The patient's current medications and supplements 3) The patient's height, weight, and BMI have been recorded in the chart   [facility]) Lab Results  Component Value Date   HGBA1C 5.8 08/07/2022   Stable, pt to continue current medical treatment metfomrin 500 mg qd, actos 15 mg qd   Hyperlipidemia Lab Results  Component Value Date   LDLCALC 71  08/07/2022   Uncontrolled, goal ldl < 70,, pt to continue current statin pravachol 20 mg qd, declines change today   Hypertension BP Readings from Last 3 Encounters:  08/07/22 124/70  12/12/21 130/70  11/01/21 138/80   Stable, pt to continue medical treatment calan sr 240 qd, losartan 50 qd   Cough Etiology unclear, exam benign, for cxr r/o pulm source  Followup: Return in about 6 months (around 02/05/2023).  Cathlean Cower, MD 08/13/2022 6:35 PM [facility]

## 2022-08-08 ENCOUNTER — Other Ambulatory Visit: Payer: Self-pay | Admitting: Radiology

## 2022-08-08 ENCOUNTER — Telehealth: Payer: Self-pay | Admitting: Internal Medicine

## 2022-08-08 MED ORDER — LOSARTAN POTASSIUM 50 MG PO TABS: 50.0000 mg | ORAL_TABLET | Freq: Every day | ORAL | 0 refills | Status: DC

## 2022-08-08 NOTE — Telephone Encounter (Signed)
[facility]

## 2022-08-08 NOTE — Telephone Encounter (Signed)
Patient needs a new prescription for her losartan - this needs to be sent to [facility].

## 2022-08-13 ENCOUNTER — Encounter: Payer: Self-pay | Admitting: Internal Medicine

## 2022-08-13 NOTE — Assessment & Plan Note (Signed)
Etiology unclear, exam benign, for cxr r/o pulm source

## 2022-08-13 NOTE — Assessment & Plan Note (Signed)
Age and sex appropriate education and counseling updated with regular exercise and diet Referrals for preventative services - declines GYN for pap Immunizations addressed - declines covid booster Smoking counseling  - none needed Evidence for depression or other mood disorder - none significant Most recent labs reviewed. I have personally reviewed and have noted: 1) the patient's medical and social history 2) The patient's current medications and supplements 3) The patient's height, weight, and BMI have been recorded in the chart

## 2022-08-13 NOTE — Assessment & Plan Note (Signed)
Lab Results  Component Value Date   LDLCALC 71 08/07/2022   Uncontrolled, goal ldl < 70,, pt to continue current statin pravachol 20 mg qd, declines change today

## 2022-08-13 NOTE — Assessment & Plan Note (Signed)
BP Readings from Last 3 Encounters:  08/07/22 124/70  12/12/21 130/70  11/01/21 138/80   Stable, pt to continue medical treatment calan sr 240 qd, losartan 50 qd

## 2022-08-13 NOTE — Assessment & Plan Note (Signed)
Lab Results  Component Value Date   HGBA1C 5.8 08/07/2022   Stable, pt to continue current medical treatment metfomrin 500 mg qd, actos 15 mg qd

## 2022-09-04 ENCOUNTER — Other Ambulatory Visit: Payer: Self-pay | Admitting: Internal Medicine

## 2022-09-21 ENCOUNTER — Other Ambulatory Visit: Payer: Self-pay | Admitting: Internal Medicine

## 2022-10-09 ENCOUNTER — Other Ambulatory Visit: Payer: Self-pay | Admitting: Internal Medicine

## 2022-10-13 ENCOUNTER — Other Ambulatory Visit: Payer: Self-pay | Admitting: Internal Medicine

## 2022-10-17 ENCOUNTER — Telehealth: Payer: Self-pay | Admitting: Internal Medicine

## 2022-10-17 MED ORDER — METFORMIN HCL 500 MG PO TABS: 500.0000 mg | ORAL_TABLET | Freq: Every day | ORAL | 2 refills | Status: DC

## 2022-10-17 NOTE — Telephone Encounter (Signed)
Resent rx to [facility].../lmb 

## 2022-10-17 NOTE — Addendum Note (Signed)
Addended by: Deatra James on: 10/17/2022 02:48 PM   Modules accepted: Orders

## 2022-10-17 NOTE — Telephone Encounter (Signed)
[facility] has been [facility] to [facility]ec[facility]

## 2022-10-17 NOTE — Telephone Encounter (Signed)
Patient said this rx needs to be sent to [facility] - It was stated on the previous encounter - Please resend

## 2022-10-17 NOTE — Telephone Encounter (Signed)
Prescription Request  10/17/2022  LOV: 08/07/2022  What is the name of the medication or equipment? metFORMIN (GLUCOPHAGE) 500 MG tablet   Have you contacted your pharmacy to request a refill? No   Which pharmacy would you like this sent to?    [facility] - [place], [place] - [address] [place] [address] Phone: [phone] Fax: [phone]  Patient notified that their request is being sent to the clinical staff for review and that they should receive a response within 2 business days.   Please advise at Mobile [phone] (mobile)

## 2022-10-20 ENCOUNTER — Other Ambulatory Visit: Payer: Self-pay | Admitting: Internal Medicine

## 2022-11-12 ENCOUNTER — Other Ambulatory Visit: Payer: Self-pay | Admitting: Internal Medicine

## 2022-11-13 ENCOUNTER — Other Ambulatory Visit: Payer: Self-pay

## 2022-11-17 ENCOUNTER — Other Ambulatory Visit: Payer: Self-pay

## 2022-11-17 DIAGNOSIS — E7800 Pure hypercholesterolemia, unspecified: Secondary | ICD-10-CM

## 2022-11-17 MED ORDER — PRAVASTATIN SODIUM 20 MG PO TABS: 20.0000 mg | ORAL_TABLET | Freq: Every day | ORAL | 3 refills | Status: DC

## 2022-11-30 ENCOUNTER — Other Ambulatory Visit: Payer: Self-pay | Admitting: Endocrinology

## 2022-12-09 ENCOUNTER — Other Ambulatory Visit: Payer: Self-pay

## 2022-12-09 ENCOUNTER — Other Ambulatory Visit: Payer: Self-pay | Admitting: Internal Medicine

## 2022-12-17 ENCOUNTER — Telehealth: Payer: Self-pay | Admitting: Internal Medicine

## 2022-12-17 DIAGNOSIS — R040 Epistaxis: Secondary | ICD-10-CM

## 2022-12-17 NOTE — Telephone Encounter (Signed)
Pt called stating she has two nose bleeds last week and 1 this morning . Pt wants to know should she be concerned.

## 2022-12-19 NOTE — Telephone Encounter (Signed)
Ok to let pt know -   This is not overly concerning, but it might better to check a blood count CBC ( I have ordered) at her convenience  And since she had so many, , ok to HOLD the ASA if she has any further bleeding, and I will refer to ENT as sometimes a small amount of cautery is needed to stop the bleeding for good

## 2022-12-19 NOTE — Telephone Encounter (Signed)
Called and left voice mail

## 2022-12-28 ENCOUNTER — Other Ambulatory Visit: Payer: Self-pay | Admitting: Endocrinology

## 2023-01-01 ENCOUNTER — Other Ambulatory Visit: Payer: Self-pay | Admitting: Internal Medicine

## 2023-01-06 ENCOUNTER — Ambulatory Visit: Payer: Medicare Other

## 2023-01-06 ENCOUNTER — Ambulatory Visit
Admission: RE | Admit: 2023-01-06 | Discharge: 2023-01-06 | Disposition: A | Discharge status: Home / Self Care | Payer: Medicare Other | Source: Ambulatory Visit | Attending: Obstetrics & Gynecology | Admitting: Obstetrics & Gynecology

## 2023-01-06 DIAGNOSIS — N6310 Unspecified lump in the right breast, unspecified quadrant: Secondary | ICD-10-CM

## 2023-01-06 DIAGNOSIS — N6312 Unspecified lump in the right breast, upper inner quadrant: Secondary | ICD-10-CM | POA: Diagnosis not present

## 2023-01-06 DIAGNOSIS — R921 Mammographic calcification found on diagnostic imaging of breast: Secondary | ICD-10-CM | POA: Diagnosis not present

## 2023-01-07 ENCOUNTER — Ambulatory Visit (INDEPENDENT_AMBULATORY_CARE_PROVIDER_SITE_OTHER): Payer: Medicare Other | Admitting: Internal Medicine

## 2023-01-07 ENCOUNTER — Other Ambulatory Visit: Payer: Self-pay | Admitting: Obstetrics & Gynecology

## 2023-01-07 ENCOUNTER — Encounter: Payer: Self-pay | Admitting: Internal Medicine

## 2023-01-07 VITALS — BP 120/70 | HR 83 | Temp 98.1°F | Ht 64.5 in | Wt 198.0 lb

## 2023-01-07 DIAGNOSIS — E119 Type 2 diabetes mellitus without complications: Secondary | ICD-10-CM

## 2023-01-07 DIAGNOSIS — E559 Vitamin D deficiency, unspecified: Secondary | ICD-10-CM

## 2023-01-07 DIAGNOSIS — N6310 Unspecified lump in the right breast, unspecified quadrant: Secondary | ICD-10-CM

## 2023-01-07 DIAGNOSIS — E7800 Pure hypercholesterolemia, unspecified: Secondary | ICD-10-CM

## 2023-01-07 DIAGNOSIS — Z7984 Long term (current) use of oral hypoglycemic drugs: Secondary | ICD-10-CM

## 2023-01-07 DIAGNOSIS — I10 Essential (primary) hypertension: Secondary | ICD-10-CM | POA: Diagnosis not present

## 2023-01-07 DIAGNOSIS — R040 Epistaxis: Secondary | ICD-10-CM | POA: Diagnosis not present

## 2023-01-07 DIAGNOSIS — J309 Allergic rhinitis, unspecified: Secondary | ICD-10-CM | POA: Diagnosis not present

## 2023-01-07 LAB — CBC WITH DIFFERENTIAL/PLATELET
Basophils Absolute: 0 10*3/uL (ref 0.0–0.1)
Basophils Relative: 1 % (ref 0.0–3.0)
Eosinophils Absolute: 0.1 10*3/uL (ref 0.0–0.7)
Eosinophils Relative: 3.8 % (ref 0.0–5.0)
HCT: 40.5 % (ref 36.0–46.0)
Hemoglobin: 13 g/dL (ref 12.0–15.0)
Lymphocytes Relative: 37.6 % (ref 12.0–46.0)
Lymphs Abs: 1.3 10*3/uL (ref 0.7–4.0)
MCHC: 32.1 g/dL (ref 30.0–36.0)
MCV: 87.4 fl (ref 78.0–100.0)
Monocytes Absolute: 0.3 10*3/uL (ref 0.1–1.0)
Monocytes Relative: 9.3 % (ref 3.0–12.0)
Neutro Abs: 1.7 10*3/uL (ref 1.4–7.7)
Neutrophils Relative %: 48.3 % (ref 43.0–77.0)
Platelets: 243 10*3/uL (ref 150.0–400.0)
RBC: 4.63 Mil/uL (ref 3.87–5.11)
RDW: 14.1 % (ref 11.5–15.5)
WBC: 3.5 10*3/uL — ABNORMAL LOW (ref 4.0–10.5)

## 2023-01-07 LAB — BASIC METABOLIC PANEL
BUN: 18 mg/dL (ref 6–23)
CO2: 28 mEq/L (ref 19–32)
Calcium: 10.1 mg/dL (ref 8.4–10.5)
Chloride: 104 mEq/L (ref 96–112)
Creatinine, Ser: 1.14 mg/dL (ref 0.40–1.20)
GFR: 46.29 mL/min — ABNORMAL LOW (ref >60.00)
Glucose, Bld: 96 mg/dL (ref 70–99)
Potassium: 4.2 mEq/L (ref 3.5–5.1)
Sodium: 139 mEq/L (ref 135–145)

## 2023-01-07 LAB — LIPID PANEL
Cholesterol: 144 mg/dL (ref 0–200)
HDL: 59.4 mg/dL (ref >39.00)
LDL Cholesterol: 63 mg/dL (ref 0–99)
NonHDL: 84.34
Total CHOL/HDL Ratio: 2
Triglycerides: 106 mg/dL (ref 0.0–149.0)
VLDL: 21.2 mg/dL (ref 0.0–40.0)

## 2023-01-07 LAB — HEPATIC FUNCTION PANEL
ALT: 34 U/L (ref 0–35)
AST: 34 U/L (ref 0–37)
Albumin: 4.2 g/dL (ref 3.5–5.2)
Alkaline Phosphatase: 105 U/L (ref 39–117)
Bilirubin, Direct: 0.1 mg/dL (ref 0.0–0.3)
Total Bilirubin: 0.3 mg/dL (ref 0.2–1.2)
Total Protein: 8 g/dL (ref 6.0–8.3)

## 2023-01-07 LAB — VITAMIN D 25 HYDROXY (VIT D DEFICIENCY, FRACTURES): VITD: 80.12 ng/mL (ref 30.00–100.00)

## 2023-01-07 LAB — HEMOGLOBIN A1C: Hgb A1c MFr Bld: 5.7 % (ref 4.6–6.5)

## 2023-01-07 NOTE — Patient Instructions (Addendum)
Please call if you would want the referral to ENT for further nosebleeds  Please continue all other medications as before, and refills have been done if requested.  Please have the pharmacy call with any other refills you may need.  Please continue your efforts at being more active, low cholesterol diet, and weight control..  Please keep your appointments with your specialists as you may have planned  Please go to the LAB at the blood drawing area for the tests to be done  You will be contacted by phone if any changes need to be made immediately.  Otherwise, you will receive a letter about your results with an explanation, but please check with MyChart first.  Please make an Appointment to return in 6 months, or sooner if needed

## 2023-01-07 NOTE — Progress Notes (Signed)
Patient ID: Daisy Dillon, female   DOB: [birth year], 77 y.o.   MRN: [id number]        Chief Complaint: follow up recurrent nosebleeds, dm, hld, htn       HPI:  Daisy Dillon is a 77 y.o. female here overall doing ok, but has had 3 recent mild left sided nose bleeds without pain, fever, ear fullness or ST.  Pt denies chest pain, increased sob or doe, wheezing, orthopnea, PND, increased LE swelling, palpitations, dizziness or syncope.   Pt denies polydipsia, polyuria, or new focal neuro s/s.    Pt denies fever, wt loss, night sweats, loss of appetite, or other constitutional symptoms  No longer seeing Dr Lucianne Muss who is retiring next mo for DM.  Does have several wks ongoing nasal allergy symptoms with clearish congestion, itch and sneezing, without fever, pain, ST, cough, swelling or wheezing.        Wt Readings from Last 3 Encounters:  01/07/23 198 lb (89.8 kg)  08/07/22 198 lb (89.8 kg)  01/31/22 199 lb (90.3 kg)   BP Readings from Last 3 Encounters:  01/07/23 120/70  08/07/22 124/70  12/12/21 130/70         Past Medical History:  Diagnosis Date   Anemia    long ago   Arthritis    Diabetes mellitus    Diverticulosis of colon (without mention of hemorrhage)    Elevated cholesterol    Fibroid    Gastritis    GERD (gastroesophageal reflux disease)    Hiatal hernia    Hypertension    IBS (irritable bowel syndrome)    Positive ANA (antinuclear antibody) 04/23/2012   Stricture and stenosis of esophagus 09/03/2005   Urinary incontinence    Past Surgical History:  Procedure Laterality Date   ABDOMINAL HYSTERECTOMY  1985   BACK SURGERY  11/2016   CHOLECYSTECTOMY     COLONOSCOPY  12/15/2007   diverticulosis   ESOPHAGOGASTRODUODENOSCOPY  12/24/2012   normal    KNEE SURGERY Right    arthroscopic   UPPER GASTROINTESTINAL ENDOSCOPY      reports that she has never smoked. She has never used smokeless tobacco. She reports that she does not drink alcohol and does not use  drugs. family history includes Bipolar disorder in her brother; Breast cancer in her maternal aunt; Diabetes in her brother and mother; Hypertension in her mother; Other in her father; Stroke in her brother and brother. Allergies  Allergen Reactions   Codeine Phosphate     REACTION: unspecified, nausea, vomiting   Current Outpatient Medications on File Prior to Visit  Medication Sig Dispense Refill   losartan (COZAAR) 50 MG tablet TAKE 1 TABLET BY MOUTH DAILY 90 tablet 3   Ascorbic Acid (VITAMIN C) 1000 MG tablet Take 1,000 mg by mouth daily.     aspirin 81 MG tablet Take 81 mg by mouth daily.     benzonatate (TESSALON PERLES) 100 MG capsule 1-2 tab by mouth every 8 hrs as needed for cough 60 capsule 1   bisoprolol-hydrochlorothiazide (ZIAC) 2.5-6.25 MG tablet TAKE 1 TABLET BY MOUTH DAILY 100 tablet 2   Blood Glucose Monitoring Suppl (ONETOUCH VERIO FLEX SYSTEM) w/Device KIT Use as instructed to check blood sugar once daily. DX:E11.9 1 kit 0   Cholecalciferol (VITAMIN D3) 1000 UNITS CAPS Take 1 capsule by mouth daily.     dexlansoprazole (DEXILANT) 60 MG capsule Take 1 capsule (60 mg total) by mouth daily. 90 capsule 3   dicyclomine (BENTYL) 10  MG capsule TAKE 2 CAPSULES BY MOUTH 3 TIMES DAILY BEFORE MEALS 540 capsule 4   fexofenadine (ALLEGRA) 180 MG tablet Take 1 tablet (180 mg total) by mouth daily. (Patient taking differently: Take 180 mg by mouth daily. As needed) 30 tablet 2   glucose blood (ONETOUCH VERIO) test strip USE 1 STRIP TO CHECK GLUCOSE ONCE DAILY 100 each 2   metFORMIN (GLUCOPHAGE) 500 MG tablet Take 1 tablet (500 mg total) by mouth daily with breakfast. 90 tablet 2   Multiple Vitamin (MULTIVITAMIN) tablet Take 1 tablet by mouth daily.     pioglitazone (ACTOS) 15 MG tablet TAKE 1 TABLET BY MOUTH DAILY 90 tablet 3   potassium chloride (KLOR-CON) 10 MEQ tablet Take 1 tablet by mouth once daily 90 tablet 0   pravastatin (PRAVACHOL) 20 MG tablet Take 1 tablet (20 mg total) by  mouth daily. 90 tablet 3   verapamil (CALAN-SR) 240 MG CR tablet TAKE 1 TABLET BY MOUTH AT  BEDTIME 90 tablet 3   No current facility-administered medications on file prior to visit.        ROS:  All others reviewed and negative.  Objective        PE:  BP 120/70 (BP Location: Left Arm, Patient Position: Sitting, Cuff Size: Normal)   Pulse 83   Temp 98.1 F (36.7 C) (Oral)   Ht 5' 4.5" (1.638 m)   Wt 198 lb (89.8 kg)   SpO2 98%   BMI 33.46 kg/m                 Constitutional: Pt appears in NAD               HENT: Head: NCAT.                Right Ear: External ear normal.                 Left Ear: External ear normal.                Eyes: . Pupils are equal, round, and reactive to light. Conjunctivae and EOM are normal               Nose: without d/c or deformity               Neck: Neck supple. Gross normal ROM               Cardiovascular: Normal rate and regular rhythm.                 Pulmonary/Chest: Effort normal and breath sounds without rales or wheezing.                Abd:  Soft, NT, ND, + BS, no organomegaly               Neurological: Pt is alert. At baseline orientation, motor grossly intact               Skin: Skin is warm. No rashes, no other new lesions, LE edema - none               Psychiatric: Pt behavior is normal without agitation   Micro: none  Cardiac tracings I have personally interpreted today:  none  Pertinent Radiological findings (summarize): none   Lab Results  Component Value Date   WBC 3.5 (L) 01/07/2023   HGB 13.0 01/07/2023   HCT 40.5 01/07/2023   PLT 243.0 01/07/2023   GLUCOSE 96 01/07/2023  CHOL 144 01/07/2023   TRIG 106.0 01/07/2023   HDL 59.40 01/07/2023   LDLCALC 63 01/07/2023   ALT 34 01/07/2023   AST 34 01/07/2023   NA 139 01/07/2023   K 4.2 01/07/2023   CL 104 01/07/2023   CREATININE 1.14 01/07/2023   BUN 18 01/07/2023   CO2 28 01/07/2023   TSH 1.99 08/07/2022   INR 1.12 02/07/2018   HGBA1C 5.7 01/07/2023    MICROALBUR 1.0 08/07/2022   Assessment/Plan:  Daisy Dillon is a 77 y.o. Black or African American [2] female with  has a past medical history of Anemia, Arthritis, Diabetes mellitus, Diverticulosis of colon (without mention of hemorrhage), Elevated cholesterol, Fibroid, Gastritis, GERD (gastroesophageal reflux disease), Hiatal hernia, Hypertension, IBS (irritable bowel syndrome), Positive ANA (antinuclear antibody) (04/23/2012), Stricture and stenosis of esophagus (09/03/2005), and Urinary incontinence.  Diabetes (HCC) Lab Results  Component Value Date   HGBA1C 5.7 01/07/2023   Stable, pt to continue current medical treatment metformin 500 mg every day, actos 15 qd   Frequent nosebleeds O/w asympt, for cbc with labs, declines ENT for now but will call for any further bleeding  Hyperlipidemia Lab Results  Component Value Date   LDLCALC 63 01/07/2023   Stable, pt to continue current statin pravachol 20 mg qd   Hypertension BP Readings from Last 3 Encounters:  01/07/23 120/70  08/07/22 124/70  12/12/21 130/70   Stable, pt to continue medical treatment losartan 50 every day, ziac 1 every day, calan sr 240 qd   Allergic rhinitis Mild to mod, for restart allegra 180 every day prn,  to f/u any worsening symptoms or concerns Followup: Return in about 6 months (around 07/10/2023).  Oliver Barre, MD 01/10/2023 2:56 PM [facility]

## 2023-01-07 NOTE — Progress Notes (Signed)
The test results show that your current treatment is OK, as the tests are stable.  Please continue the same plan.  There is no other need for change of treatment or further evaluation based on these results, at this time.  thanks 

## 2023-01-10 ENCOUNTER — Encounter: Payer: Self-pay | Admitting: Internal Medicine

## 2023-01-10 DIAGNOSIS — R040 Epistaxis: Secondary | ICD-10-CM | POA: Insufficient documentation

## 2023-01-10 NOTE — Assessment & Plan Note (Signed)
O/w asympt, for cbc with labs, declines ENT for now but will call for any further bleeding

## 2023-01-10 NOTE — Assessment & Plan Note (Signed)
Lab Results  Component Value Date   HGBA1C 5.7 01/07/2023   Stable, pt to continue current medical treatment metformin 500 mg every day, actos 15 qd

## 2023-01-10 NOTE — Assessment & Plan Note (Signed)
BP Readings from Last 3 Encounters:  01/07/23 120/70  08/07/22 124/70  12/12/21 130/70   Stable, pt to continue medical treatment losartan 50 every day, ziac 1 every day, calan sr 240 qd

## 2023-01-10 NOTE — Assessment & Plan Note (Signed)
Mild to mod, for restart allegra 180 every day prn,  to f/u any worsening symptoms or concerns

## 2023-01-10 NOTE — Assessment & Plan Note (Signed)
Lab Results  Component Value Date   LDLCALC 63 01/07/2023   Stable, pt to continue current statin pravachol 20 mg qd

## 2023-01-23 ENCOUNTER — Telehealth: Payer: Self-pay | Admitting: Internal Medicine

## 2023-01-23 MED ORDER — HYDROCODONE BIT-HOMATROP MBR 5-1.5 MG/5ML PO SOLN: 5.0000 mL | Freq: Four times a day (QID) | ORAL | 0 refills | Status: DC | PRN

## 2023-01-23 NOTE — Telephone Encounter (Signed)
During last OV pt stated Dr. Jonny Ruiz mention about prescribing pt cough syrup. Please advise.

## 2023-01-23 NOTE — Telephone Encounter (Signed)
Ok this is done thanks

## 2023-01-26 MED ORDER — PROMETHAZINE-DM 6.25-15 MG/5ML PO SYRP: 5.0000 mL | ORAL_SOLUTION | Freq: Four times a day (QID) | ORAL | 0 refills | Status: DC | PRN

## 2023-01-26 NOTE — Telephone Encounter (Signed)
Ok I have changed the cough med

## 2023-01-26 NOTE — Telephone Encounter (Signed)
Patient called and said her insurance doesn't cover the medication Dr. Jonny Ruiz sent in for her. She would like to know if there is anything else she can take. Best callback is [phone].

## 2023-01-27 NOTE — Telephone Encounter (Signed)
Patient is aware of the medication change and has already picked it up

## 2023-01-30 ENCOUNTER — Other Ambulatory Visit: Payer: Self-pay | Admitting: Endocrinology

## 2023-01-30 ENCOUNTER — Other Ambulatory Visit: Payer: Self-pay

## 2023-01-30 MED ORDER — DICYCLOMINE HCL 10 MG PO CAPS: ORAL_CAPSULE | ORAL | 4 refills | Status: DC

## 2023-02-09 ENCOUNTER — Ambulatory Visit (INDEPENDENT_AMBULATORY_CARE_PROVIDER_SITE_OTHER): Payer: Medicare Other

## 2023-02-09 ENCOUNTER — Other Ambulatory Visit: Payer: Self-pay | Admitting: Physician Assistant

## 2023-02-09 ENCOUNTER — Ambulatory Visit: Payer: Medicare Other | Admitting: Internal Medicine

## 2023-02-09 ENCOUNTER — Encounter: Payer: Self-pay | Admitting: Internal Medicine

## 2023-02-09 VITALS — BP 138/84 | HR 90 | Temp 99.0°F | Ht 64.5 in | Wt 199.0 lb

## 2023-02-09 DIAGNOSIS — M19012 Primary osteoarthritis, left shoulder: Secondary | ICD-10-CM | POA: Diagnosis not present

## 2023-02-09 DIAGNOSIS — M25512 Pain in left shoulder: Secondary | ICD-10-CM

## 2023-02-09 DIAGNOSIS — I10 Essential (primary) hypertension: Secondary | ICD-10-CM | POA: Diagnosis not present

## 2023-02-09 DIAGNOSIS — E119 Type 2 diabetes mellitus without complications: Secondary | ICD-10-CM | POA: Diagnosis not present

## 2023-02-09 DIAGNOSIS — Z7984 Long term (current) use of oral hypoglycemic drugs: Secondary | ICD-10-CM

## 2023-02-09 DIAGNOSIS — E7800 Pure hypercholesterolemia, unspecified: Secondary | ICD-10-CM | POA: Diagnosis not present

## 2023-02-09 MED ORDER — TRAMADOL HCL 50 MG PO TABS
50.0000 mg | ORAL_TABLET | Freq: Four times a day (QID) | ORAL | 0 refills | Status: DC | PRN
Start: 2023-02-09 — End: 2023-10-09

## 2023-02-09 NOTE — Assessment & Plan Note (Signed)
Mild to mod, likely rotater cuff tear vs tendonitis, for tramadol prn, xray today, and refer sports medicin

## 2023-02-09 NOTE — Assessment & Plan Note (Signed)
Lab Results  Component Value Date   HGBA1C 5.7 01/07/2023   Stable, pt to continue current medical treatment metformin 500 every day, actos 15 qd

## 2023-02-09 NOTE — Assessment & Plan Note (Signed)
Lab Results  Component Value Date   LDLCALC 63 01/07/2023   Stable, pt to continue current statin pravachol 20 qd

## 2023-02-09 NOTE — Patient Instructions (Signed)
You appear to have left shoulder rotater cuff tendonitis  Please take all new medication as prescribed - the tramadol as needed for pain  Please continue all other medications as before, and refills have been done if requested.  Please have the pharmacy call with any other refills you may need.  Please keep your appointments with your specialists as you may have planned  Please go to the XRAY Department in the first floor for the x-ray testing  You will be contacted regarding the referral for: Sports Medicine on the first floor

## 2023-02-09 NOTE — Progress Notes (Signed)
Patient ID: Daisy Dillon, female   DOB: 1945/11/08, 77 y.o.   MRN: [id number]        Chief Complaint: follow up left shoulder pain, htn, hld, hyperglycemia       HPI:  Daisy Dillon is a 77 y.o. female here with 3 days onset seeminly unprovoked left shoulder pain with swelling, somewhat sore to touch and much worse to try raise overhead with reduced ROM.  Pt denies chest pain, increased sob or doe, wheezing, orthopnea, PND, increased LE swelling, palpitations, dizziness or syncope.   Pt denies polydipsia, polyuria, or new focal neuro s/s.  Pt denies fever, wt loss, night sweats, loss of appetite, or other constitutional symptoms         Wt Readings from Last 3 Encounters:  02/09/23 199 lb (90.3 kg)  01/07/23 198 lb (89.8 kg)  08/07/22 198 lb (89.8 kg)   BP Readings from Last 3 Encounters:  02/09/23 138/84  01/07/23 120/70  08/07/22 124/70         Past Medical History:  Diagnosis Date   Anemia    long ago   Arthritis    Diabetes mellitus    Diverticulosis of colon (without mention of hemorrhage)    Elevated cholesterol    Fibroid    Gastritis    GERD (gastroesophageal reflux disease)    Hiatal hernia    Hypertension    IBS (irritable bowel syndrome)    Positive ANA (antinuclear antibody) 04/23/2012   Stricture and stenosis of esophagus 09/03/2005   Urinary incontinence    Past Surgical History:  Procedure Laterality Date   ABDOMINAL HYSTERECTOMY  1985   BACK SURGERY  11/2016   CHOLECYSTECTOMY     COLONOSCOPY  12/15/2007   diverticulosis   ESOPHAGOGASTRODUODENOSCOPY  12/24/2012   normal    KNEE SURGERY Right    arthroscopic   UPPER GASTROINTESTINAL ENDOSCOPY      reports that she has never smoked. She has never used smokeless tobacco. She reports that she does not drink alcohol and does not use drugs. family history includes Bipolar disorder in her brother; Breast cancer in her maternal aunt; Diabetes in her brother and mother; Hypertension in her  mother; Other in her father; Stroke in her brother and brother. Allergies  Allergen Reactions   Codeine Phosphate     REACTION: unspecified, nausea, vomiting   Current Outpatient Medications on File Prior to Visit  Medication Sig Dispense Refill   Ascorbic Acid (VITAMIN C) 1000 MG tablet Take 1,000 mg by mouth daily.     aspirin 81 MG tablet Take 81 mg by mouth daily.     benzonatate (TESSALON PERLES) 100 MG capsule 1-2 tab by mouth every 8 hrs as needed for cough 60 capsule 1   bisoprolol-hydrochlorothiazide (ZIAC) 2.5-6.25 MG tablet TAKE 1 TABLET BY MOUTH DAILY 100 tablet 2   Blood Glucose Monitoring Suppl (ONETOUCH VERIO FLEX SYSTEM) w/Device KIT Use as instructed to check blood sugar once daily. DX:E11.9 1 kit 0   Cholecalciferol (VITAMIN D3) 1000 UNITS CAPS Take 1 capsule by mouth daily.     dexlansoprazole (DEXILANT) 60 MG capsule Take 1 capsule (60 mg total) by mouth daily. 90 capsule 3   dicyclomine (BENTYL) 10 MG capsule TAKE 2 CAPSULES BY MOUTH 3 TIMES DAILY BEFORE MEALS 540 capsule 4   glucose blood (ONETOUCH VERIO) test strip USE 1 STRIP TO CHECK GLUCOSE ONCE DAILY 100 each 2   losartan (COZAAR) 50 MG tablet TAKE 1 TABLET BY MOUTH DAILY  90 tablet 3   metFORMIN (GLUCOPHAGE) 500 MG tablet Take 1 tablet (500 mg total) by mouth daily with breakfast. 90 tablet 2   Multiple Vitamin (MULTIVITAMIN) tablet Take 1 tablet by mouth daily.     pioglitazone (ACTOS) 15 MG tablet TAKE 1 TABLET BY MOUTH DAILY 90 tablet 3   potassium chloride (KLOR-CON) 10 MEQ tablet Take 1 tablet by mouth once daily 90 tablet 0   pravastatin (PRAVACHOL) 20 MG tablet Take 1 tablet (20 mg total) by mouth daily. 90 tablet 3   promethazine-dextromethorphan (PROMETHAZINE-DM) 6.25-15 MG/5ML syrup Take 5 mLs by mouth 4 (four) times daily as needed for cough. 118 mL 0   verapamil (CALAN-SR) 240 MG CR tablet TAKE 1 TABLET BY MOUTH AT  BEDTIME 30 tablet 0   fexofenadine (ALLEGRA) 180 MG tablet Take 1 tablet (180 mg total)  by mouth daily. (Patient taking differently: Take 180 mg by mouth daily. As needed) 30 tablet 2   No current facility-administered medications on file prior to visit.        ROS:  All others reviewed and negative.  Objective        PE:  BP 138/84 (BP Location: Right Arm, Patient Position: Sitting, Cuff Size: Normal)   Pulse 90   Temp 99 F (37.2 C) (Oral)   Ht 5' 4.5" (1.638 m)   Wt 199 lb (90.3 kg)   SpO2 98%   BMI 33.63 kg/m                 Constitutional: Pt appears in NAD               HENT: Head: NCAT.                Right Ear: External ear normal.                 Left Ear: External ear normal.                Eyes: . Pupils are equal, round, and reactive to light. Conjunctivae and EOM are normal               Nose: without d/c or deformity               Neck: Neck supple. Gross normal ROM               Cardiovascular: Normal rate and regular rhythm.                 Pulmonary/Chest: Effort normal and breath sounds without rales or wheezing.                Abd:  Soft, NT, ND, + BS, no organomegaly               Neurological: Pt is alert. At baseline orientation, motor grossly intact               Skin: Skin is warm. No rashes, no other new lesions, LE edema - none; left shoulder diffuse mild swelling and decreased ROM to 110 degrees               Psychiatric: Pt behavior is normal without agitation   Micro: none  Cardiac tracings I have personally interpreted today:  none  Pertinent Radiological findings (summarize): none   Lab Results  Component Value Date   WBC 3.5 (L) 01/07/2023   HGB 13.0 01/07/2023   HCT 40.5 01/07/2023   PLT 243.0 01/07/2023  GLUCOSE 96 01/07/2023   CHOL 144 01/07/2023   TRIG 106.0 01/07/2023   HDL 59.40 01/07/2023   LDLCALC 63 01/07/2023   ALT 34 01/07/2023   AST 34 01/07/2023   NA 139 01/07/2023   K 4.2 01/07/2023   CL 104 01/07/2023   CREATININE 1.14 01/07/2023   BUN 18 01/07/2023   CO2 28 01/07/2023   TSH 1.99 08/07/2022   INR  1.12 02/07/2018   HGBA1C 5.7 01/07/2023   MICROALBUR 1.0 08/07/2022   Assessment/Plan:  Daisy Dillon is a 77 y.o. Black or African American [2] female with  has a past medical history of Anemia, Arthritis, Diabetes mellitus, Diverticulosis of colon (without mention of hemorrhage), Elevated cholesterol, Fibroid, Gastritis, GERD (gastroesophageal reflux disease), Hiatal hernia, Hypertension, IBS (irritable bowel syndrome), Positive ANA (antinuclear antibody) (04/23/2012), Stricture and stenosis of esophagus (09/03/2005), and Urinary incontinence.  Diabetes (HCC) Lab Results  Component Value Date   HGBA1C 5.7 01/07/2023   Stable, pt to continue current medical treatment metformin 500 every day, actos 15 qd   Hyperlipidemia Lab Results  Component Value Date   LDLCALC 63 01/07/2023   Stable, pt to continue current statin pravachol 20 qd   Hypertension BP Readings from Last 3 Encounters:  02/09/23 138/84  01/07/23 120/70  08/07/22 124/70   Stable, pt to continue medical treatment ziac 2.5-6.25 every day, losartan 50 every day, calan sr 240 qd   Acute pain of left shoulder Mild to mod, likely rotater cuff tear vs tendonitis, for tramadol prn, xray today, and refer sports medicin  Followup: Return if symptoms worsen or fail to improve.  Oliver Barre, MD 02/09/2023 8:38 PM [facility]

## 2023-02-09 NOTE — Assessment & Plan Note (Signed)
BP Readings from Last 3 Encounters:  02/09/23 138/84  01/07/23 120/70  08/07/22 124/70   Stable, pt to continue medical treatment ziac 2.5-6.25 every day, losartan 50 every day, calan sr 240 qd

## 2023-02-11 ENCOUNTER — Telehealth: Payer: Self-pay | Admitting: Physician Assistant

## 2023-02-11 MED ORDER — DICYCLOMINE HCL 10 MG PO CAPS: ORAL_CAPSULE | ORAL | 0 refills | Status: DC

## 2023-02-11 NOTE — Progress Notes (Deleted)
    Aleen Sells D.[facility] [address] [place] [address] Phone: [phone]   Assessment and Plan:     There are no diagnoses linked to this encounter.  ***   Pertinent previous records reviewed include ***   Follow Up: ***     Subjective:   I, Keegan Ducey, am serving as a Neurosurgeon for Doctor Richardean Sale  Chief Complaint: left shoulder pain   HPI:   02/12/2023 Patient is a 77 year old female complaining of left shoulder pain. Patient states  Relevant Historical Information: ***  Additional pertinent review of systems negative.   Current Outpatient Medications:    Ascorbic Acid (VITAMIN C) 1000 MG tablet, Take 1,000 mg by mouth daily., Disp: , Rfl:    aspirin 81 MG tablet, Take 81 mg by mouth daily., Disp: , Rfl:    benzonatate (TESSALON PERLES) 100 MG capsule, 1-2 tab by mouth every 8 hrs as needed for cough, Disp: 60 capsule, Rfl: 1   bisoprolol-hydrochlorothiazide (ZIAC) 2.5-6.25 MG tablet, TAKE 1 TABLET BY MOUTH DAILY, Disp: 100 tablet, Rfl: 2   Blood Glucose Monitoring Suppl (ONETOUCH VERIO FLEX SYSTEM) w/Device KIT, Use as instructed to check blood sugar once daily. DX:E11.9, Disp: 1 kit, Rfl: 0   Cholecalciferol (VITAMIN D3) 1000 UNITS CAPS, Take 1 capsule by mouth daily., Disp: , Rfl:    dexlansoprazole (DEXILANT) 60 MG capsule, Take 1 capsule (60 mg total) by mouth daily., Disp: 90 capsule, Rfl: 3   dicyclomine (BENTYL) 10 MG capsule, TAKE 2 CAPSULES BY MOUTH 3 TIMES DAILY BEFORE MEALS--NEEDS APPT FOR ANY FURTHER REFILLS, Disp: 540 capsule, Rfl: 0   fexofenadine (ALLEGRA) 180 MG tablet, Take 1 tablet (180 mg total) by mouth daily. (Patient taking differently: Take 180 mg by mouth daily. As needed), Disp: 30 tablet, Rfl: 2   glucose blood (ONETOUCH VERIO) test strip, USE 1 STRIP TO CHECK GLUCOSE ONCE DAILY, Disp: 100 each, Rfl: 2   losartan (COZAAR) 50 MG tablet, TAKE 1 TABLET BY MOUTH DAILY, Disp: 90 tablet, Rfl:  3   metFORMIN (GLUCOPHAGE) 500 MG tablet, Take 1 tablet (500 mg total) by mouth daily with breakfast., Disp: 90 tablet, Rfl: 2   Multiple Vitamin (MULTIVITAMIN) tablet, Take 1 tablet by mouth daily., Disp: , Rfl:    pioglitazone (ACTOS) 15 MG tablet, TAKE 1 TABLET BY MOUTH DAILY, Disp: 90 tablet, Rfl: 3   potassium chloride (KLOR-CON) 10 MEQ tablet, Take 1 tablet by mouth once daily, Disp: 90 tablet, Rfl: 0   pravastatin (PRAVACHOL) 20 MG tablet, Take 1 tablet (20 mg total) by mouth daily., Disp: 90 tablet, Rfl: 3   promethazine-dextromethorphan (PROMETHAZINE-DM) 6.25-15 MG/5ML syrup, Take 5 mLs by mouth 4 (four) times daily as needed for cough., Disp: 118 mL, Rfl: 0   traMADol (ULTRAM) 50 MG tablet, Take 1 tablet (50 mg total) by mouth every 6 (six) hours as needed., Disp: 30 tablet, Rfl: 0   verapamil (CALAN-SR) 240 MG CR tablet, TAKE 1 TABLET BY MOUTH AT  BEDTIME, Disp: 30 tablet, Rfl: 0   Objective:     There were no vitals filed for this visit.    There is no height or weight on file to calculate BMI.    Physical Exam:    ***   Electronically signed by:  Aleen Sells D.[facility] 12:19 PM 02/11/23

## 2023-02-11 NOTE — Telephone Encounter (Signed)
Inbound call from patient stating she need refill for dicyclomine medication. States it was sent to [facility] pharmacy but needs to be sent to [facility]. Please advise, thank you.

## 2023-02-11 NOTE — Telephone Encounter (Signed)
Prescription sent to patient's mail order pharmacy. Informed patient on prescription that she needs appt for further refills.

## 2023-02-12 ENCOUNTER — Ambulatory Visit: Payer: Medicare Other | Admitting: Sports Medicine

## 2023-02-13 ENCOUNTER — Ambulatory Visit (INDEPENDENT_AMBULATORY_CARE_PROVIDER_SITE_OTHER): Payer: Medicare Other

## 2023-02-13 VITALS — Ht 64.5 in | Wt 199.0 lb

## 2023-02-13 DIAGNOSIS — Z0000 Encounter for general adult medical examination without abnormal findings: Secondary | ICD-10-CM | POA: Diagnosis not present

## 2023-02-13 NOTE — Progress Notes (Signed)
Subjective:   Daisy Dillon is a 77 y.o. female who presents for Medicare Annual (Subsequent) preventive examination.  Visit Complete: Virtual  I connected with  Oline Knapke Perusse on 02/13/23 by a audio enabled telemedicine application and verified that I am speaking with the correct person using two identifiers.  Patient Location: Home  Provider Location: Office/Clinic  I discussed the limitations of evaluation and management by telemedicine. The patient expressed understanding and agreed to proceed.  Vital Signs: Because this visit was a virtual/telehealth visit, some criteria may be missing or patient reported. Any vitals not documented were not able to be obtained and vitals that have been documented are patient reported.    Review of Systems    Cardiac Risk Factors include: advanced age (>20men, >36 women);dyslipidemia;diabetes mellitus     Objective:    Today's Vitals   02/13/23 1120  Weight: 199 lb (90.3 kg)  Height: 5' 4.5" (1.638 m)   Body mass index is 33.63 kg/m.     02/13/2023   11:25 AM 01/31/2022   11:15 AM 02/14/2021   10:36 AM 10/02/2016    1:09 PM 11/02/2015    5:23 PM  Advanced Directives  Does Patient Have a Medical Advance Directive? Yes No No No No  Type of Estate agent of [place];Living will      Copy of Healthcare Power of Attorney in Chart? No - copy requested      Would patient like information on creating a medical advance directive?  No - Patient declined No - Patient declined Yes (MAU/Ambulatory/Procedural Areas - Information given) No - patient declined information    Current Medications (verified) Outpatient Encounter Medications as of 02/13/2023  Medication Sig   Ascorbic Acid (VITAMIN C) 1000 MG tablet Take 1,000 mg by mouth daily.   aspirin 81 MG tablet Take 81 mg by mouth daily.   benzonatate (TESSALON PERLES) 100 MG capsule 1-2 tab by mouth every 8 hrs as needed for cough    bisoprolol-hydrochlorothiazide (ZIAC) 2.5-6.25 MG tablet TAKE 1 TABLET BY MOUTH DAILY   Blood Glucose Monitoring Suppl (ONETOUCH VERIO FLEX SYSTEM) w/Device KIT Use as instructed to check blood sugar once daily. DX:E11.9   Cholecalciferol (VITAMIN D3) 1000 UNITS CAPS Take 1 capsule by mouth daily.   dexlansoprazole (DEXILANT) 60 MG capsule Take 1 capsule (60 mg total) by mouth daily.   dicyclomine (BENTYL) 10 MG capsule TAKE 2 CAPSULES BY MOUTH 3 TIMES DAILY BEFORE MEALS--NEEDS APPT FOR ANY FURTHER REFILLS   glucose blood (ONETOUCH VERIO) test strip USE 1 STRIP TO CHECK GLUCOSE ONCE DAILY   losartan (COZAAR) 50 MG tablet TAKE 1 TABLET BY MOUTH DAILY   metFORMIN (GLUCOPHAGE) 500 MG tablet Take 1 tablet (500 mg total) by mouth daily with breakfast.   Multiple Vitamin (MULTIVITAMIN) tablet Take 1 tablet by mouth daily.   pioglitazone (ACTOS) 15 MG tablet TAKE 1 TABLET BY MOUTH DAILY   potassium chloride (KLOR-CON) 10 MEQ tablet Take 1 tablet by mouth once daily   pravastatin (PRAVACHOL) 20 MG tablet Take 1 tablet (20 mg total) by mouth daily.   promethazine-dextromethorphan (PROMETHAZINE-DM) 6.25-15 MG/5ML syrup Take 5 mLs by mouth 4 (four) times daily as needed for cough.   traMADol (ULTRAM) 50 MG tablet Take 1 tablet (50 mg total) by mouth every 6 (six) hours as needed.   verapamil (CALAN-SR) 240 MG CR tablet TAKE 1 TABLET BY MOUTH AT  BEDTIME   fexofenadine (ALLEGRA) 180 MG tablet Take 1 tablet (180 mg  total) by mouth daily. (Patient taking differently: Take 180 mg by mouth daily. As needed)   No facility-administered encounter medications on file as of 02/13/2023.    Allergies (verified) Codeine phosphate   History: Past Medical History:  Diagnosis Date   Anemia    long ago   Arthritis    Diabetes mellitus    Diverticulosis of colon (without mention of hemorrhage)    Elevated cholesterol    Fibroid    Gastritis    GERD (gastroesophageal reflux disease)    Hiatal hernia     Hypertension    IBS (irritable bowel syndrome)    Positive ANA (antinuclear antibody) 04/23/2012   Stricture and stenosis of esophagus 09/03/2005   Urinary incontinence    Past Surgical History:  Procedure Laterality Date   ABDOMINAL HYSTERECTOMY  1985   BACK SURGERY  11/2016   CHOLECYSTECTOMY     COLONOSCOPY  12/15/2007   diverticulosis   ESOPHAGOGASTRODUODENOSCOPY  12/24/2012   normal    KNEE SURGERY Right    arthroscopic   UPPER GASTROINTESTINAL ENDOSCOPY     Family History  Problem Relation Age of Onset   Diabetes Mother    Hypertension Mother    Other Father        on the job accident   Stroke Brother        x 2   Bipolar disorder Brother    Stroke Brother    Diabetes Brother    Breast cancer Maternal Aunt        Age 65's   Colon cancer Neg Hx    Stomach cancer Neg Hx    Throat cancer Neg Hx    Liver disease Neg Hx    Colon polyps Neg Hx    Esophageal cancer Neg Hx    Rectal cancer Neg Hx    Social History   Socioeconomic History   Marital status: Divorced    Spouse name: Not on file   Number of children: 2   Years of education: 13.5   Highest education level: Not on file  Occupational History   Occupation: Retired  Tobacco Use   Smoking status: Never   Smokeless tobacco: Never  Vaping Use   Vaping status: Never Used  Substance and Sexual Activity   Alcohol use: No    Alcohol/week: 0.0 standard drinks of alcohol   Drug use: No   Sexual activity: Not Currently    Birth control/protection: Surgical    Comment: 1ST intercourse- 17, partners- 4, hysterectomy  Other Topics Concern   Not on file  Social History Narrative   Lives alone   Right-handed   2 caffeinated drinks per day   Social Determinants of Health   Financial Resource Strain: Low Risk  (02/13/2023)   Overall Financial Resource Strain (CARDIA)    Difficulty of Paying Living Expenses: Not very hard  Food Insecurity: No Food Insecurity (02/13/2023)   Hunger Vital Sign    Worried About  Running Out of Food in the Last Year: Never true    Ran Out of Food in the Last Year: Never true  Transportation Needs: No Transportation Needs (02/13/2023)   PRAPARE - Administrator, Civil Service (Medical): No    Lack of Transportation (Non-Medical): No  Physical Activity: Inactive (02/13/2023)   Exercise Vital Sign    Days of Exercise per Week: 0 days    Minutes of Exercise per Session: 0 min  Stress: No Stress Concern Present (02/13/2023)   [employer] of  Occupational Health - Occupational Stress Questionnaire    Feeling of Stress : Not at all  Social Connections: Moderately Integrated (02/13/2023)   Social Connection and Isolation Panel [NHANES]    Frequency of Communication with Friends and Family: More than three times a week    Frequency of Social Gatherings with Friends and Family: More than three times a week    Attends Religious Services: 1 to 4 times per year    Active Member of [employer] or Organizations: No    Attends Engineer, structural: 1 to 4 times per year    Marital Status: Divorced    Tobacco Counseling Counseling given: Not Answered   Clinical Intake:  Pre-visit preparation completed: Yes  Pain : No/denies pain     BMI - recorded: 33.63 Nutritional Status: BMI > 30  Obese Nutritional Risks: None Diabetes: No  How often do you need to have someone help you when you read instructions, pamphlets, or other written materials from your doctor or pharmacy?: 1 - Never  Interpreter Needed?: No  Information entered by :: Jamontae Thwaites, RMA   Activities of Daily Living    02/13/2023   11:22 AM  In your present state of health, do you have any difficulty performing the following activities:  Hearing? 0  Vision? 0  Difficulty concentrating or making decisions? 0  Walking or climbing stairs? 0  Dressing or bathing? 0  Doing errands, shopping? 0  Preparing Food and eating ? N  Using the Toilet? N  In the past six months, have you  accidently leaked urine? Y  Managing your Medications? N  Managing your Finances? N  Housekeeping or managing your Housekeeping? N    Patient Care Team: Corwin Levins, MD as PCP - General  Indicate any recent Medical Services you may have received from other than [facility] providers in the past year (date may be approximate).     Assessment:   This is a routine wellness examination for Daisy Dillon.  Hearing/Vision screen Hearing Screening - Comments:: Denies hearing difficulties   Vision Screening - Comments:: Wears eyeglasses  Dietary issues and exercise activities discussed:     Goals Addressed   None   Depression Screen    02/13/2023   11:32 AM 02/09/2023    3:40 PM 01/07/2023    1:48 PM 08/07/2022   10:29 AM 01/31/2022   11:09 AM 01/23/2022    2:07 PM 08/05/2021   11:35 AM  PHQ 2/9 Scores  PHQ - 2 Score 0 0 0 0 0 1 0  PHQ- 9 Score 0 0 0 0       Fall Risk    02/13/2023   11:25 AM 02/09/2023    3:40 PM 01/07/2023    1:48 PM 01/07/2023    1:46 PM 08/07/2022   10:29 AM  Fall Risk   Falls in the past year? 0 0 0 0 0  Number falls in past yr: 0 0 0 0 0  Injury with Fall? 0 0 0 0 0  Risk for fall due to : No Fall Risks No Fall Risks   No Fall Risks  Follow up Falls prevention discussed;Falls evaluation completed Falls evaluation completed Falls evaluation completed  Falls evaluation completed    MEDICARE RISK AT HOME: Medicare Risk at Home Any stairs in or around the home?: No Home free of loose throw rugs in walkways, pet beds, electrical cords, etc?: Yes Adequate lighting in your home to reduce risk of falls?: Yes Life alert?:  No Use of a cane, walker or w/c?: No Grab bars in the bathroom?: No Shower chair or bench in shower?: No Elevated toilet seat or a handicapped toilet?: No  TIMED UP AND GO:  Was the test performed?  No    Cognitive Function:        02/13/2023   11:26 AM 01/31/2022   11:16 AM  6CIT Screen  What Year? 0 points 0 points  What month? 0 points 0  points  What time? 0 points 0 points  Count back from 20 0 points 0 points  Months in reverse 0 points 0 points  Repeat phrase 0 points 0 points  Total Score 0 points 0 points    Immunizations Immunization History  Administered Date(s) Administered   Fluad Quad(high Dose 65+) 04/21/2019, 03/26/2020   Influenza Split 04/10/2011   Influenza,inj,Quad PF,6+ Mos 04/12/2012, 04/13/2013, 04/17/2014, 04/19/2015, 04/22/2016, 04/24/2017, 06/10/2018   Influenza-Unspecified 05/07/2021, 04/11/2022   PFIZER(Purple Top)SARS-COV-2 Vaccination 08/09/2019, 08/29/2019, 04/23/2020, 10/22/2020, 04/11/2022   Pneumococcal Conjugate-13 04/26/2013   Pneumococcal Polysaccharide-23 05/08/2015   Tdap 04/26/2013   Zoster Recombinant(Shingrix) 07/27/2021, 10/07/2021    TDAP status: Due, Education has been provided regarding the importance of this vaccine. Advised may receive this vaccine at local pharmacy or Health Dept. Aware to provide a copy of the vaccination record if obtained from local pharmacy or Health Dept. Verbalized acceptance and understanding.  Flu Vaccine status: Due, Education has been provided regarding the importance of this vaccine. Advised may receive this vaccine at local pharmacy or Health Dept. Aware to provide a copy of the vaccination record if obtained from local pharmacy or Health Dept. Verbalized acceptance and understanding.  Pneumococcal vaccine status: Up to date  Covid-19 vaccine status: Information provided on how to obtain vaccines.   Qualifies for Shingles Vaccine? Yes   Zostavax completed Yes   Shingrix Completed?: Yes  Screening Tests Health Maintenance  Topic Date Due   PAP SMEAR-Modifier  04/25/2019   COVID-19 Vaccine (6 - 2023-24 season) 06/06/2022   INFLUENZA VACCINE  01/15/2023   OPHTHALMOLOGY EXAM  03/04/2023   DTaP/Tdap/Td (2 - Td or Tdap) 04/27/2023   HEMOGLOBIN A1C  07/10/2023   Diabetic kidney evaluation - Urine ACR  08/08/2023   FOOT EXAM  08/08/2023    Diabetic kidney evaluation - eGFR measurement  01/07/2024   Medicare Annual Wellness (AWV)  02/13/2024   Pneumonia Vaccine 58+ Years old  Completed   DEXA SCAN  Completed   Hepatitis C Screening  Completed   Zoster Vaccines- Shingrix  Completed   HPV VACCINES  Aged Out    Health Maintenance  Health Maintenance Due  Topic Date Due   PAP SMEAR-Modifier  04/25/2019   COVID-19 Vaccine (6 - 2023-24 season) 06/06/2022   INFLUENZA VACCINE  01/15/2023    Colorectal cancer screening: No longer required.   Mammogram status: Completed 01/06/2023. Repeat every year  Bone Density status: Completed 10/15/2021. Results reflect: Bone density results: NORMAL. Repeat every 2 years.  Lung Cancer Screening: (Low Dose CT Chest recommended if Age 33-80 years, 20 pack-year currently smoking OR have quit w/in 15years.) does not qualify.   Lung Cancer Screening Referral: N/A  Additional Screening:  Hepatitis C Screening: does qualify; Completed 08/05/2006  Vision Screening: Recommended annual ophthalmology exams for early detection of glaucoma and other disorders of the eye. Is the patient up to date with their annual eye exam?  No  Who is the provider or what is the name of the office in which the  patient attends annual eye exams? [facility] If pt is not established with a provider, would they like to be referred to a provider to establish care? No .   Dental Screening: Recommended annual dental exams for proper oral hygiene  Diabetic Foot Exam: Diabetic Foot Exam: Completed 08/07/2022  Community Resource Referral / Chronic Care Management: CRR required this visit?  No   CCM required this visit?  No     Plan:     I have personally reviewed and noted the following in the patient's chart:   Medical and social history Use of alcohol, tobacco or illicit drugs  Current medications and supplements including opioid prescriptions. Patient is not currently taking opioid prescriptions. Functional  ability and status Nutritional status Physical activity Advanced directives List of other physicians Hospitalizations, surgeries, and ER visits in previous 12 months Vitals Screenings to include cognitive, depression, and falls Referrals and appointments  In addition, I have reviewed and discussed with patient certain preventive protocols, quality metrics, and best practice recommendations. A written personalized care plan for preventive services as well as general preventive health recommendations were provided to patient.     Onesty Clair L Alysia Scism, CMA   02/13/2023   After Visit Summary: (MyChart) Due to this being a telephonic visit, the after visit summary with patients personalized plan was offered to patient via MyChart   Nurse Notes: Patient is due for a Tdap vaccine and soon due for the Flu and Covid vaccines.  She is also due for her annual eye exam, which she stated that she will be calling them in the next week to schedule her appointment.  Patient had no other concerns to address today.

## 2023-02-13 NOTE — Patient Instructions (Addendum)
Daisy Dillon , Thank you for taking time to come for your Medicare Wellness Visit. I appreciate your ongoing commitment to your health goals. Please review the following plan we discussed and let me know if I can assist you in the future.   Referrals/Orders/Follow-Ups/Clinician Recommendations: Remember to call [facility] for your annual eye exam.  You are due for a Tetanus vaccine and soon a Flu and Covid vaccines as well.  It was nice to speak with you today.  Each day, aim for 6 glasses of water, plenty of protein in your diet and try to get up and walk/ stretch every hour for 5-10 minutes at a time.    This is a list of the screening recommended for you and due dates:  Health Maintenance  Topic Date Due   Pap Smear  04/25/2019   COVID-19 Vaccine (6 - 2023-24 season) 06/06/2022   Flu Shot  01/15/2023   Eye exam for diabetics  03/04/2023   DTaP/Tdap/Td vaccine (2 - Td or Tdap) 04/27/2023   Hemoglobin A1C  07/10/2023   Yearly kidney health urinalysis for diabetes  08/08/2023   Complete foot exam   08/08/2023   Yearly kidney function blood test for diabetes  01/07/2024   Medicare Annual Wellness Visit  02/13/2024   Pneumonia Vaccine  Completed   DEXA scan (bone density measurement)  Completed   Hepatitis C Screening  Completed   Zoster (Shingles) Vaccine  Completed   HPV Vaccine  Aged Out    Advanced directives: (Copy Requested) Please bring a copy of your health care power of attorney and living will to the office to be added to your chart at your convenience.  Next Medicare Annual Wellness Visit scheduled for next year: Yes

## 2023-02-13 NOTE — Progress Notes (Unsigned)
    Aleen Sells D.[facility] [address] [place] [address] Phone: [phone]   Assessment and Plan:     There are no diagnoses linked to this encounter.  ***   Pertinent previous records reviewed include ***   Follow Up: ***     Subjective:   I, Daisy Dillon, am serving as a Neurosurgeon for Doctor Richardean Sale  Chief Complaint: left shoulder pain   HPI:   02/17/2023 Patient is a 77 year old female complaining of left shoulder pain. Patient states  Relevant Historical Information: ***  Additional pertinent review of systems negative.   Current Outpatient Medications:    Ascorbic Acid (VITAMIN C) 1000 MG tablet, Take 1,000 mg by mouth daily., Disp: , Rfl:    aspirin 81 MG tablet, Take 81 mg by mouth daily., Disp: , Rfl:    benzonatate (TESSALON PERLES) 100 MG capsule, 1-2 tab by mouth every 8 hrs as needed for cough, Disp: 60 capsule, Rfl: 1   bisoprolol-hydrochlorothiazide (ZIAC) 2.5-6.25 MG tablet, TAKE 1 TABLET BY MOUTH DAILY, Disp: 100 tablet, Rfl: 2   Blood Glucose Monitoring Suppl (ONETOUCH VERIO FLEX SYSTEM) w/Device KIT, Use as instructed to check blood sugar once daily. DX:E11.9, Disp: 1 kit, Rfl: 0   Cholecalciferol (VITAMIN D3) 1000 UNITS CAPS, Take 1 capsule by mouth daily., Disp: , Rfl:    dexlansoprazole (DEXILANT) 60 MG capsule, Take 1 capsule (60 mg total) by mouth daily., Disp: 90 capsule, Rfl: 3   dicyclomine (BENTYL) 10 MG capsule, TAKE 2 CAPSULES BY MOUTH 3 TIMES DAILY BEFORE MEALS--NEEDS APPT FOR ANY FURTHER REFILLS, Disp: 540 capsule, Rfl: 0   fexofenadine (ALLEGRA) 180 MG tablet, Take 1 tablet (180 mg total) by mouth daily. (Patient taking differently: Take 180 mg by mouth daily. As needed), Disp: 30 tablet, Rfl: 2   glucose blood (ONETOUCH VERIO) test strip, USE 1 STRIP TO CHECK GLUCOSE ONCE DAILY, Disp: 100 each, Rfl: 2   losartan (COZAAR) 50 MG tablet, TAKE 1 TABLET BY MOUTH DAILY, Disp: 90 tablet, Rfl: 3    metFORMIN (GLUCOPHAGE) 500 MG tablet, Take 1 tablet (500 mg total) by mouth daily with breakfast., Disp: 90 tablet, Rfl: 2   Multiple Vitamin (MULTIVITAMIN) tablet, Take 1 tablet by mouth daily., Disp: , Rfl:    pioglitazone (ACTOS) 15 MG tablet, TAKE 1 TABLET BY MOUTH DAILY, Disp: 90 tablet, Rfl: 3   potassium chloride (KLOR-CON) 10 MEQ tablet, Take 1 tablet by mouth once daily, Disp: 90 tablet, Rfl: 0   pravastatin (PRAVACHOL) 20 MG tablet, Take 1 tablet (20 mg total) by mouth daily., Disp: 90 tablet, Rfl: 3   promethazine-dextromethorphan (PROMETHAZINE-DM) 6.25-15 MG/5ML syrup, Take 5 mLs by mouth 4 (four) times daily as needed for cough., Disp: 118 mL, Rfl: 0   traMADol (ULTRAM) 50 MG tablet, Take 1 tablet (50 mg total) by mouth every 6 (six) hours as needed., Disp: 30 tablet, Rfl: 0   verapamil (CALAN-SR) 240 MG CR tablet, TAKE 1 TABLET BY MOUTH AT  BEDTIME, Disp: 30 tablet, Rfl: 0   Objective:     There were no vitals filed for this visit.    There is no height or weight on file to calculate BMI.    Physical Exam:    ***   Electronically signed by:  Aleen Sells D.[facility] 7:15 AM 02/13/23

## 2023-02-17 ENCOUNTER — Ambulatory Visit: Payer: Medicare Other | Admitting: Sports Medicine

## 2023-02-17 VITALS — BP 138/84 | HR 88 | Ht 61.0 in | Wt 200.0 lb

## 2023-02-17 DIAGNOSIS — M25512 Pain in left shoulder: Secondary | ICD-10-CM | POA: Diagnosis not present

## 2023-02-17 DIAGNOSIS — M7552 Bursitis of left shoulder: Secondary | ICD-10-CM

## 2023-02-17 MED ORDER — MELOXICAM 15 MG PO TABS: 15.0000 mg | ORAL_TABLET | Freq: Every day | ORAL | 0 refills | Status: DC

## 2023-02-17 NOTE — Patient Instructions (Addendum)
-   Start meloxicam 15 mg daily x2 weeks.  If still having pain after 2 weeks, complete 3rd-week of meloxicam. May use remaining meloxicam as needed once daily for pain control.  Do not to use additional NSAIDs while taking meloxicam.  May use Tylenol [phone] mg 2 to 3 times a day for breakthrough pain. Shoulder HEP  4 week follow up

## 2023-02-27 ENCOUNTER — Other Ambulatory Visit: Payer: Self-pay | Admitting: Internal Medicine

## 2023-03-10 ENCOUNTER — Telehealth: Payer: Self-pay | Admitting: Internal Medicine

## 2023-03-10 ENCOUNTER — Other Ambulatory Visit: Payer: Self-pay

## 2023-03-10 NOTE — Telephone Encounter (Signed)
Pt needs to call her Endo doctor for refill

## 2023-03-10 NOTE — Telephone Encounter (Signed)
Prescription Request  03/10/2023  LOV: 02/09/2023  What is the name of the medication or equipment? verapamil (CALAN-SR) 240 MG CR tablet   Have you contacted your pharmacy to request a refill? No   Which pharmacy would you like this sent to?  [facility] - [place], [place] - [address] [place] [address] Phone: [phone] Fax: [phone]    Patient notified that their request is being sent to the clinical staff for review and that they should receive a response within 2 business days.   Please advise at [facility] [phone]

## 2023-03-13 ENCOUNTER — Other Ambulatory Visit: Payer: Self-pay

## 2023-03-13 MED ORDER — VERAPAMIL HCL ER 240 MG PO TBCR: 240.0000 mg | EXTENDED_RELEASE_TABLET | Freq: Every day | ORAL | 3 refills | Status: DC

## 2023-03-13 NOTE — Telephone Encounter (Signed)
Ok this is done 

## 2023-03-13 NOTE — Telephone Encounter (Signed)
Patient said she doesn't see her endocrinologist anymore. She said she has been without the medication for days and would like to know if Dr. Jonny Ruiz can send it in. Patient would like a call back at [phone].

## 2023-03-13 NOTE — Telephone Encounter (Signed)
Refill re-sent  

## 2023-03-13 NOTE — Telephone Encounter (Signed)
Patient wants this to be resent to her mail order pharmacy - [facility] - She does not want this sent  back to [facility].

## 2023-03-14 ENCOUNTER — Other Ambulatory Visit: Payer: Self-pay | Admitting: Internal Medicine

## 2023-03-16 ENCOUNTER — Other Ambulatory Visit: Payer: Self-pay

## 2023-03-16 DIAGNOSIS — E119 Type 2 diabetes mellitus without complications: Secondary | ICD-10-CM | POA: Diagnosis not present

## 2023-03-16 LAB — HM DIABETES EYE EXAM

## 2023-03-19 ENCOUNTER — Ambulatory Visit: Payer: Medicare Other | Admitting: Sports Medicine

## 2023-03-31 NOTE — Progress Notes (Deleted)
Aleen Sells [facility] [address] [place] [address] Phone: [phone]   Assessment and Plan:     There are no diagnoses linked to this encounter.  ***   Pertinent previous records reviewed include ***   Follow Up: ***     Subjective:   I, Manasa Spease, am serving as a Neurosurgeon for Doctor Richardean Sale   Chief Complaint: left shoulder pain    HPI:    02/17/2023 Patient is a 77 year old female complaining of left shoulder pain. Patient states that she has had shoulder pain for about 2 weeks. Gradual increase in pain. Decreased ROM . States the pain started after a shot. Tramadol for the pain and that helps. No radiating pain. Decrease in grip strength.   04/14/2023 Patient states   Relevant Historical Information: DM type II, GERD, hypertension, positive ANA  Additional pertinent review of systems negative.   Current Outpatient Medications:    Ascorbic Acid (VITAMIN C) 1000 MG tablet, Take 1,000 mg by mouth daily., Disp: , Rfl:    aspirin 81 MG tablet, Take 81 mg by mouth daily., Disp: , Rfl:    benzonatate (TESSALON PERLES) 100 MG capsule, 1-2 tab by mouth every 8 hrs as needed for cough, Disp: 60 capsule, Rfl: 1   bisoprolol-hydrochlorothiazide (ZIAC) 2.5-6.25 MG tablet, TAKE 1 TABLET BY MOUTH DAILY, Disp: 100 tablet, Rfl: 2   Blood Glucose Monitoring Suppl (ONETOUCH VERIO FLEX SYSTEM) w/Device KIT, Use as instructed to check blood sugar once daily. DX:E11.9, Disp: 1 kit, Rfl: 0   Cholecalciferol (VITAMIN D3) 1000 UNITS CAPS, Take 1 capsule by mouth daily., Disp: , Rfl:    dexlansoprazole (DEXILANT) 60 MG capsule, Take 1 capsule (60 mg total) by mouth daily., Disp: 90 capsule, Rfl: 3   dicyclomine (BENTYL) 10 MG capsule, TAKE 2 CAPSULES BY MOUTH 3 TIMES DAILY BEFORE MEALS--NEEDS APPT FOR ANY FURTHER REFILLS, Disp: 540 capsule, Rfl: 0   fexofenadine (ALLEGRA) 180 MG tablet, Take 1 tablet (180 mg total) by mouth daily.  (Patient taking differently: Take 180 mg by mouth daily. As needed), Disp: 30 tablet, Rfl: 2   glucose blood (ONETOUCH VERIO) test strip, USE 1 STRIP TO CHECK GLUCOSE ONCE DAILY, Disp: 100 each, Rfl: 2   losartan (COZAAR) 50 MG tablet, TAKE 1 TABLET BY MOUTH DAILY, Disp: 90 tablet, Rfl: 3   meloxicam (MOBIC) 15 MG tablet, Take 1 tablet (15 mg total) by mouth daily., Disp: 30 tablet, Rfl: 0   metFORMIN (GLUCOPHAGE) 500 MG tablet, Take 1 tablet (500 mg total) by mouth daily with breakfast., Disp: 90 tablet, Rfl: 2   Multiple Vitamin (MULTIVITAMIN) tablet, Take 1 tablet by mouth daily., Disp: , Rfl:    pioglitazone (ACTOS) 15 MG tablet, TAKE 1 TABLET BY MOUTH DAILY, Disp: 90 tablet, Rfl: 3   potassium chloride (KLOR-CON) 10 MEQ tablet, Take 1 tablet by mouth once daily, Disp: 90 tablet, Rfl: 0   pravastatin (PRAVACHOL) 20 MG tablet, Take 1 tablet (20 mg total) by mouth daily., Disp: 90 tablet, Rfl: 3   promethazine-dextromethorphan (PROMETHAZINE-DM) 6.25-15 MG/5ML syrup, TAKE 5 ML BY MOUTH  4 TIMES DAILY AS NEEDED FOR COUGH, Disp: 118 mL, Rfl: 0   traMADol (ULTRAM) 50 MG tablet, Take 1 tablet (50 mg total) by mouth every 6 (six) hours as needed., Disp: 30 tablet, Rfl: 0   verapamil (CALAN-SR) 240 MG CR tablet, Take 1 tablet (240 mg total) by mouth at bedtime., Disp: 90 tablet, Rfl:  3   Objective:     There were no vitals filed for this visit.    There is no height or weight on file to calculate BMI.    Physical Exam:    ***   Electronically signed by:  Aleen [facility]ells [facility] 8:48 AM 03/31/23

## 2023-04-09 ENCOUNTER — Other Ambulatory Visit: Payer: Self-pay | Admitting: Gastroenterology

## 2023-04-14 ENCOUNTER — Ambulatory Visit: Payer: Medicare Other | Admitting: Sports Medicine

## 2023-04-18 ENCOUNTER — Other Ambulatory Visit: Payer: Self-pay | Admitting: Internal Medicine

## 2023-04-20 ENCOUNTER — Other Ambulatory Visit: Payer: Self-pay

## 2023-04-20 ENCOUNTER — Ambulatory Visit (INDEPENDENT_AMBULATORY_CARE_PROVIDER_SITE_OTHER): Payer: Medicare Other | Admitting: Sports Medicine

## 2023-04-20 VITALS — BP 132/84 | HR 90 | Ht 61.0 in | Wt 201.0 lb

## 2023-04-20 DIAGNOSIS — M7552 Bursitis of left shoulder: Secondary | ICD-10-CM

## 2023-04-20 DIAGNOSIS — M25512 Pain in left shoulder: Secondary | ICD-10-CM

## 2023-04-20 NOTE — Progress Notes (Signed)
Daisy Dillon [facility] [address] [place] [address] Phone: [phone]   Assessment and Plan:     1. Acute pain of left shoulder 2. Subacromial bursitis of left shoulder joint -Acute, improving, subsequent visit - Overall significant improvement in left shoulder pain after completing course of meloxicam, starting HEP - Discontinue meloxicam 15 mg daily - Continue HEP for shoulder and rotator cuff    Pertinent previous records reviewed include none   Follow Up: As needed.  Could consider repeat meloxicam course versus subacromial CSI   Subjective:   I, Daisy Dillon, am serving as a Neurosurgeon for Doctor Richardean Sale   Chief Complaint: left shoulder pain    HPI:    02/17/2023 Patient is a 77 year old female complaining of left shoulder pain. Patient states that she has had shoulder pain for about 2 weeks. Gradual increase in pain. Decreased ROM . States the pain started after a shot. Tramadol for the pain and that helps. No radiating pain. Decrease in grip strength.   04/20/2023 Patient states that she is fine the shoulder is feeling better    Relevant Historical Information: DM type II, GERD, hypertension, positive ANA  Additional pertinent review of systems negative.   Current Outpatient Medications:    Ascorbic Acid (VITAMIN C) 1000 MG tablet, Take 1,000 mg by mouth daily., Disp: , Rfl:    aspirin 81 MG tablet, Take 81 mg by mouth daily., Disp: , Rfl:    benzonatate (TESSALON PERLES) 100 MG capsule, 1-2 tab by mouth every 8 hrs as needed for cough, Disp: 60 capsule, Rfl: 1   bisoprolol-hydrochlorothiazide (ZIAC) 2.5-6.25 MG tablet, TAKE 1 TABLET BY MOUTH DAILY, Disp: 100 tablet, Rfl: 2   Blood Glucose Monitoring Suppl (ONETOUCH VERIO FLEX SYSTEM) w/Device KIT, Use as instructed to check blood sugar once daily. DX:E11.9, Disp: 1 kit, Rfl: 0   Cholecalciferol (VITAMIN D3) 1000 UNITS CAPS, Take 1 capsule by mouth daily.,  Disp: , Rfl:    dexlansoprazole (DEXILANT) 60 MG capsule, Take 1 capsule (60 mg total) by mouth daily., Disp: 90 capsule, Rfl: 3   dicyclomine (BENTYL) 10 MG capsule, TAKE 2 CAPSULES BY MOUTH 3 TIMES DAILY BEFORE MEALS--NEEDS APPT FOR ANY FURTHER REFILLS, Disp: 540 capsule, Rfl: 0   glucose blood (ONETOUCH VERIO) test strip, USE 1 STRIP TO CHECK GLUCOSE ONCE DAILY, Disp: 100 each, Rfl: 2   losartan (COZAAR) 50 MG tablet, TAKE 1 TABLET BY MOUTH DAILY, Disp: 90 tablet, Rfl: 3   meloxicam (MOBIC) 15 MG tablet, Take 1 tablet (15 mg total) by mouth daily., Disp: 30 tablet, Rfl: 0   metFORMIN (GLUCOPHAGE) 500 MG tablet, TAKE 1 TABLET BY MOUTH DAILY  WITH BREAKFAST, Disp: 90 tablet, Rfl: 3   Multiple Vitamin (MULTIVITAMIN) tablet, Take 1 tablet by mouth daily., Disp: , Rfl:    pioglitazone (ACTOS) 15 MG tablet, TAKE 1 TABLET BY MOUTH DAILY, Disp: 90 tablet, Rfl: 3   potassium chloride (KLOR-CON) 10 MEQ tablet, Take 1 tablet by mouth once daily, Disp: 90 tablet, Rfl: 0   pravastatin (PRAVACHOL) 20 MG tablet, Take 1 tablet (20 mg total) by mouth daily., Disp: 90 tablet, Rfl: 3   promethazine-dextromethorphan (PROMETHAZINE-DM) 6.25-15 MG/5ML syrup, TAKE 5 ML BY MOUTH  4 TIMES DAILY AS NEEDED FOR COUGH, Disp: 118 mL, Rfl: 0   traMADol (ULTRAM) 50 MG tablet, Take 1 tablet (50 mg total) by mouth every 6 (six) hours as needed., Disp: 30 tablet, Rfl: 0  verapamil (CALAN-SR) 240 MG CR tablet, Take 1 tablet (240 mg total) by mouth at bedtime., Disp: 90 tablet, Rfl: 3   fexofenadine (ALLEGRA) 180 MG tablet, Take 1 tablet (180 mg total) by mouth daily. (Patient taking differently: Take 180 mg by mouth daily. As needed), Disp: 30 tablet, Rfl: 2   Objective:     Vitals:   04/20/23 1446  BP: 132/84  Pulse: 90  SpO2: 97%  Weight: 201 lb (91.2 kg)  Height: 5\' 1"  (1.549 m)      Body mass index is 37.98 kg/m.    Physical Exam:    Gen: Appears well, nad, nontoxic and pleasant Neuro:sensation intact, strength  is 5/5 with df/pf/inv/ev, muscle tone wnl Skin: no suspicious lesion or defmority Psych: A&O, appropriate mood and affect  Left shoulder:  No deformity, swelling or muscle wasting No scapular winging FF 180, abd 180, int 0, ext 90 NTTP over the [place], clavicle, ac, coracoid, biceps groove, humerus, deltoid, trapezius, cervical spine Neg neer, hawkins, empty can, obriens, crossarm, subscap liftoff, speeds Neg ant drawer, sulcus sign, apprehension Negative Spurling's test bilat FROM of neck    Electronically signed by:  Daisy Dillon [facility] 2:55 PM 04/20/23

## 2023-04-29 ENCOUNTER — Telehealth: Payer: Self-pay | Admitting: Internal Medicine

## 2023-04-29 NOTE — Telephone Encounter (Signed)
Please to have pt check insurance regarding which one, bc there are many to choose from

## 2023-04-29 NOTE — Telephone Encounter (Signed)
Pt called wanting Dr. Jonny Ruiz to prescribe her a Glucose monitor due to the old one is not working anymore. Please advise.

## 2023-05-01 MED ORDER — ONETOUCH VERIO VI STRP: ORAL_STRIP | 2 refills | Status: AC

## 2023-05-01 MED ORDER — LANCETS MISC: 3 refills | Status: AC

## 2023-05-01 MED ORDER — ONETOUCH VERIO FLEX SYSTEM W/DEVICE KIT: PACK | 0 refills | Status: AC

## 2023-05-01 NOTE — Telephone Encounter (Signed)
Done erx 

## 2023-05-17 ENCOUNTER — Other Ambulatory Visit: Payer: Self-pay | Admitting: Internal Medicine

## 2023-05-21 ENCOUNTER — Other Ambulatory Visit: Payer: Self-pay | Admitting: Gastroenterology

## 2023-06-02 ENCOUNTER — Other Ambulatory Visit: Payer: Self-pay | Admitting: Internal Medicine

## 2023-06-02 DIAGNOSIS — N6310 Unspecified lump in the right breast, unspecified quadrant: Secondary | ICD-10-CM

## 2023-06-16 ENCOUNTER — Other Ambulatory Visit: Payer: Self-pay | Admitting: Internal Medicine

## 2023-06-18 ENCOUNTER — Other Ambulatory Visit: Payer: Self-pay

## 2023-06-22 ENCOUNTER — Other Ambulatory Visit: Payer: Self-pay | Admitting: Internal Medicine

## 2023-06-25 ENCOUNTER — Other Ambulatory Visit: Payer: Self-pay | Admitting: Internal Medicine

## 2023-06-25 DIAGNOSIS — E7800 Pure hypercholesterolemia, unspecified: Secondary | ICD-10-CM

## 2023-07-01 ENCOUNTER — Telehealth: Payer: Self-pay | Admitting: Physician Assistant

## 2023-07-01 NOTE — Telephone Encounter (Signed)
Left message for patient to return call to office. Will continue efforts.  

## 2023-07-01 NOTE — Telephone Encounter (Signed)
 Patient returned call to our office asking about Dexilant  patient assistance paper work that was completed and faxed to [place] on 06-29-23.  Patient confirmed that she does not need refills on dicyclomine .  Patient advised to contact [employer] for further informaton on the status of patient assistance paper work. Patient agreed to plan and verbalized understanding.  No further questions.

## 2023-07-01 NOTE — Telephone Encounter (Signed)
 Patient called requesting renewal on Bentyl  medication stated she dropped off an application, would like to check status.

## 2023-07-03 ENCOUNTER — Other Ambulatory Visit: Payer: Self-pay | Admitting: Internal Medicine

## 2023-07-03 ENCOUNTER — Other Ambulatory Visit: Payer: Self-pay | Admitting: Obstetrics & Gynecology

## 2023-07-03 ENCOUNTER — Ambulatory Visit
Admission: RE | Admit: 2023-07-03 | Discharge: 2023-07-03 | Disposition: A | Discharge status: Home / Self Care | Payer: Medicare Other | Source: Ambulatory Visit | Attending: Internal Medicine | Admitting: Internal Medicine

## 2023-07-03 ENCOUNTER — Ambulatory Visit
Admission: RE | Admit: 2023-07-03 | Discharge: 2023-07-03 | Disposition: A | Discharge status: Home / Self Care | Payer: Medicare Other | Source: Ambulatory Visit | Attending: Obstetrics & Gynecology | Admitting: Obstetrics & Gynecology

## 2023-07-03 DIAGNOSIS — N6310 Unspecified lump in the right breast, unspecified quadrant: Secondary | ICD-10-CM

## 2023-07-03 DIAGNOSIS — R92333 Mammographic heterogeneous density, bilateral breasts: Secondary | ICD-10-CM | POA: Diagnosis not present

## 2023-07-03 DIAGNOSIS — N6312 Unspecified lump in the right breast, upper inner quadrant: Secondary | ICD-10-CM | POA: Diagnosis not present

## 2023-07-03 DIAGNOSIS — N6489 Other specified disorders of breast: Secondary | ICD-10-CM | POA: Diagnosis not present

## 2023-07-03 DIAGNOSIS — N6325 Unspecified lump in the left breast, overlapping quadrants: Secondary | ICD-10-CM | POA: Diagnosis not present

## 2023-07-10 ENCOUNTER — Ambulatory Visit (INDEPENDENT_AMBULATORY_CARE_PROVIDER_SITE_OTHER): Payer: Medicare Other | Admitting: Internal Medicine

## 2023-07-10 ENCOUNTER — Encounter: Payer: Self-pay | Admitting: Internal Medicine

## 2023-07-10 VITALS — BP 122/74 | HR 70 | Temp 99.0°F | Ht 61.0 in | Wt 200.0 lb

## 2023-07-10 DIAGNOSIS — Z0001 Encounter for general adult medical examination with abnormal findings: Secondary | ICD-10-CM

## 2023-07-10 DIAGNOSIS — E7800 Pure hypercholesterolemia, unspecified: Secondary | ICD-10-CM

## 2023-07-10 DIAGNOSIS — Z124 Encounter for screening for malignant neoplasm of cervix: Secondary | ICD-10-CM | POA: Diagnosis not present

## 2023-07-10 DIAGNOSIS — E119 Type 2 diabetes mellitus without complications: Secondary | ICD-10-CM | POA: Diagnosis not present

## 2023-07-10 DIAGNOSIS — J309 Allergic rhinitis, unspecified: Secondary | ICD-10-CM

## 2023-07-10 DIAGNOSIS — I10 Essential (primary) hypertension: Secondary | ICD-10-CM

## 2023-07-10 LAB — BASIC METABOLIC PANEL
BUN: 16 mg/dL (ref 6–23)
CO2: 24 meq/L (ref 19–32)
Calcium: 9.8 mg/dL (ref 8.4–10.5)
Chloride: 106 meq/L (ref 96–112)
Creatinine, Ser: 1.05 mg/dL (ref 0.40–1.20)
GFR: 50.92 mL/min — ABNORMAL LOW (ref >60.00)
Glucose, Bld: 86 mg/dL (ref 70–99)
Potassium: 3.9 meq/L (ref 3.5–5.1)
Sodium: 140 meq/L (ref 135–145)

## 2023-07-10 LAB — MICROALBUMIN / CREATININE URINE RATIO
Creatinine,U: 107.4 mg/dL
Microalb Creat Ratio: 0.7 mg/g (ref 0.0–30.0)
Microalb, Ur: <0.7 mg/dL (ref 0.0–1.9)

## 2023-07-10 LAB — LIPID PANEL
Cholesterol: 152 mg/dL (ref 0–200)
HDL: 62.4 mg/dL (ref >39.00)
LDL Cholesterol: 73 mg/dL (ref 0–99)
NonHDL: 89.28
Total CHOL/HDL Ratio: 2
Triglycerides: 83 mg/dL (ref 0.0–149.0)
VLDL: 16.6 mg/dL (ref 0.0–40.0)

## 2023-07-10 LAB — URINALYSIS, ROUTINE W REFLEX MICROSCOPIC
Bilirubin Urine: NEGATIVE
Hgb urine dipstick: NEGATIVE
Ketones, ur: NEGATIVE
Leukocytes,Ua: NEGATIVE
Nitrite: NEGATIVE
Specific Gravity, Urine: 1.01 (ref 1.000–1.030)
Total Protein, Urine: NEGATIVE
Urine Glucose: NEGATIVE
Urobilinogen, UA: 0.2 (ref 0.0–1.0)
pH: 6 (ref 5.0–8.0)

## 2023-07-10 LAB — CBC WITH DIFFERENTIAL/PLATELET
Basophils Absolute: 0 10*3/uL (ref 0.0–0.1)
Basophils Relative: 0.5 % (ref 0.0–3.0)
Eosinophils Absolute: 0.1 10*3/uL (ref 0.0–0.7)
Eosinophils Relative: 1.8 % (ref 0.0–5.0)
HCT: 41.1 % (ref 36.0–46.0)
Hemoglobin: 13.5 g/dL (ref 12.0–15.0)
Lymphocytes Relative: 37.6 % (ref 12.0–46.0)
Lymphs Abs: 1.2 10*3/uL (ref 0.7–4.0)
MCHC: 32.9 g/dL (ref 30.0–36.0)
MCV: 88.8 fL (ref 78.0–100.0)
Monocytes Absolute: 0.3 10*3/uL (ref 0.1–1.0)
Monocytes Relative: 9.4 % (ref 3.0–12.0)
Neutro Abs: 1.7 10*3/uL (ref 1.4–7.7)
Neutrophils Relative %: 50.7 % (ref 43.0–77.0)
Platelets: 235 10*3/uL (ref 150.0–400.0)
RBC: 4.63 Mil/uL (ref 3.87–5.11)
RDW: 14.5 % (ref 11.5–15.5)
WBC: 3.3 10*3/uL — ABNORMAL LOW (ref 4.0–10.5)

## 2023-07-10 LAB — HEPATIC FUNCTION PANEL
ALT: 32 U/L (ref 0–35)
AST: 32 U/L (ref 0–37)
Albumin: 4.1 g/dL (ref 3.5–5.2)
Alkaline Phosphatase: 102 U/L (ref 39–117)
Bilirubin, Direct: 0.1 mg/dL (ref 0.0–0.3)
Total Bilirubin: 0.3 mg/dL (ref 0.2–1.2)
Total Protein: 7.8 g/dL (ref 6.0–8.3)

## 2023-07-10 LAB — TSH: TSH: 1.51 u[IU]/mL (ref 0.35–5.50)

## 2023-07-10 LAB — HEMOGLOBIN A1C: Hgb A1c MFr Bld: 6 % (ref 4.6–6.5)

## 2023-07-10 NOTE — Progress Notes (Signed)
The test results show that your current treatment is OK, as the tests are stable.  Please continue the same plan.  There is no other need for change of treatment or further evaluation based on these results, at this time.  thanks

## 2023-07-10 NOTE — Progress Notes (Unsigned)
Patient ID: Daisy Dillon, female   DOB: 1946/05/26, 78 y.o.   MRN: [id number]         Chief Complaint:: wellness exam and dm, hld, htn, allergies       HPI:  Daisy Dillon is a 78 y.o. female here for wellness exam; declines Gyn f/u and covid booster for now, for tdap at the pharmacy o/w up to date                        Also Does have several wks ongoing nasal allergy symptoms with clearish congestion, itch and sneezing, without fever, pain, ST, cough, swelling or wheezing.  Pt denies chest pain, increased sob or doe, wheezing, orthopnea, PND, increased LE swelling, palpitations, dizziness or syncope.   Pt denies polydipsia, polyuria, or new focal neuro s/s.    Pt denies fever, wt loss, night sweats, loss of appetite, or other constitutional symptoms     Wt Readings from Last 3 Encounters:  07/10/23 200 lb (90.7 kg)  04/20/23 201 lb (91.2 kg)  02/17/23 200 lb (90.7 kg)   BP Readings from Last 3 Encounters:  07/10/23 122/74  04/20/23 132/84  02/17/23 138/84   Immunization History  Administered Date(s) Administered   Fluad Quad(high Dose 65+) 04/21/2019, 03/26/2020, 04/20/2023   Influenza Split 04/10/2011   Influenza,inj,Quad PF,6+ Mos 04/12/2012, 04/13/2013, 04/17/2014, 04/19/2015, 04/22/2016, 04/24/2017, 06/10/2018   Influenza-Unspecified 05/07/2021, 04/11/2022   PFIZER(Purple Top)SARS-COV-2 Vaccination 08/09/2019, 08/29/2019, 04/23/2020, 10/22/2020, 04/11/2022   Pneumococcal Conjugate-13 04/26/2013   Pneumococcal Polysaccharide-23 05/08/2015   Tdap 04/26/2013   Zoster Recombinant(Shingrix) 07/27/2021, 10/07/2021   Health Maintenance Due  Topic Date Due   Cervical Cancer Screening (Pap smear)  04/25/2019   DTaP/Tdap/Td (2 - Td or Tdap) 04/27/2023      Past Medical History:  Diagnosis Date   Anemia    long ago   Arthritis    Diabetes mellitus    Diverticulosis of colon (without mention of hemorrhage)    Elevated cholesterol    Fibroid    Gastritis     GERD (gastroesophageal reflux disease)    Hiatal hernia    Hypertension    IBS (irritable bowel syndrome)    Positive ANA (antinuclear antibody) 04/23/2012   Stricture and stenosis of esophagus 09/03/2005   Urinary incontinence    Past Surgical History:  Procedure Laterality Date   ABDOMINAL HYSTERECTOMY  1985   BACK SURGERY  11/2016   CHOLECYSTECTOMY     COLONOSCOPY  12/15/2007   diverticulosis   ESOPHAGOGASTRODUODENOSCOPY  12/24/2012   normal    KNEE SURGERY Right    arthroscopic   UPPER GASTROINTESTINAL ENDOSCOPY      reports that she has never smoked. She has never used smokeless tobacco. She reports that she does not drink alcohol and does not use drugs. family history includes Bipolar disorder in her brother; Breast cancer in her maternal aunt; Diabetes in her brother and mother; Hypertension in her mother; Other in her father; Stroke in her brother and brother. Allergies  Allergen Reactions   Codeine Phosphate     REACTION: unspecified, nausea, vomiting   Current Outpatient Medications on File Prior to Visit  Medication Sig Dispense Refill   Ascorbic Acid (VITAMIN C) 1000 MG tablet Take 1,000 mg by mouth daily.     aspirin 81 MG tablet Take 81 mg by mouth daily.     benzonatate (TESSALON PERLES) 100 MG capsule 1-2 tab by mouth every 8 hrs as needed for  cough 60 capsule 1   bisoprolol-hydrochlorothiazide (ZIAC) 2.5-6.25 MG tablet TAKE 1 TABLET BY MOUTH DAILY 100 tablet 2   Blood Glucose Monitoring Suppl (ONETOUCH VERIO FLEX SYSTEM) w/Device KIT Use as instructed to check blood sugar once daily. DX:E11.9 1 kit 0   Cholecalciferol (VITAMIN D3) 1000 UNITS CAPS Take 1 capsule by mouth daily.     dexlansoprazole (DEXILANT) 60 MG capsule Take 1 capsule (60 mg total) by mouth daily. 90 capsule 3   dicyclomine (BENTYL) 10 MG capsule TAKE 2 CAPSULES BY MOUTH 3 TIMES DAILY BEFORE MEALS--NEEDS APPT FOR ANY FURTHER REFILLS 540 capsule 0   glucose blood (ONETOUCH VERIO) test strip USE 1  STRIP TO CHECK GLUCOSE ONCE DAILY 100 each 2   Lancets MISC Use as directed once dialy E11.9 100 each 3   losartan (COZAAR) 50 MG tablet TAKE 1 TABLET BY MOUTH DAILY 90 tablet 3   meloxicam (MOBIC) 15 MG tablet Take 1 tablet (15 mg total) by mouth daily. 30 tablet 0   metFORMIN (GLUCOPHAGE) 500 MG tablet TAKE 1 TABLET BY MOUTH DAILY  WITH BREAKFAST 90 tablet 3   Multiple Vitamin (MULTIVITAMIN) tablet Take 1 tablet by mouth daily.     pioglitazone (ACTOS) 15 MG tablet TAKE 1 TABLET BY MOUTH DAILY 90 tablet 3   potassium chloride (KLOR-CON) 10 MEQ tablet Take 1 tablet by mouth once daily 90 tablet 0   pravastatin (PRAVACHOL) 20 MG tablet Take 1 tablet (20 mg total) by mouth daily. 90 tablet 3   promethazine-dextromethorphan (PROMETHAZINE-DM) 6.25-15 MG/5ML syrup TAKE 5 ML BY MOUTH  4 TIMES DAILY AS NEEDED FOR COUGH 118 mL 0   traMADol (ULTRAM) 50 MG tablet Take 1 tablet (50 mg total) by mouth every 6 (six) hours as needed. 30 tablet 0   verapamil (CALAN-SR) 240 MG CR tablet Take 1 tablet (240 mg total) by mouth at bedtime. 90 tablet 3   fexofenadine (ALLEGRA) 180 MG tablet Take 1 tablet (180 mg total) by mouth daily. (Patient taking differently: Take 180 mg by mouth daily. As needed) 30 tablet 2   No current facility-administered medications on file prior to visit.        ROS:  All others reviewed and negative.  Objective        PE:  BP 122/74 (BP Location: Right Arm, Patient Position: Sitting, Cuff Size: Normal)   Pulse 70   Temp 99 F (37.2 C) (Oral)   Ht 5\' 1"  (1.549 m)   Wt 200 lb (90.7 kg)   SpO2 99%   BMI 37.79 kg/m                 Constitutional: Pt appears in NAD               HENT: Head: NCAT.                Right Ear: External ear normal.                 Left Ear: External ear normal.                Eyes: . Pupils are equal, round, and reactive to light. Conjunctivae and EOM are normal               Nose: without d/c or deformity               Neck: Neck supple. Gross  normal ROM  Cardiovascular: Normal rate and regular rhythm.                 Pulmonary/Chest: Effort normal and breath sounds without rales or wheezing.                Abd:  Soft, NT, ND, + BS, no organomegaly               Neurological: Pt is alert. At baseline orientation, motor grossly intact               Skin: Skin is warm. No rashes, no other new lesions, LE edema - none               Psychiatric: Pt behavior is normal without agitation   Micro: none  Cardiac tracings I have personally interpreted today:  none  Pertinent Radiological findings (summarize): none   Lab Results  Component Value Date   WBC 3.3 (L) 07/10/2023   HGB 13.5 07/10/2023   HCT 41.1 07/10/2023   PLT 235.0 07/10/2023   GLUCOSE 86 07/10/2023   CHOL 152 07/10/2023   TRIG 83.0 07/10/2023   HDL 62.40 07/10/2023   LDLCALC 73 07/10/2023   ALT 32 07/10/2023   AST 32 07/10/2023   NA 140 07/10/2023   K 3.9 07/10/2023   CL 106 07/10/2023   CREATININE 1.05 07/10/2023   BUN 16 07/10/2023   CO2 24 07/10/2023   TSH 1.51 07/10/2023   INR 1.12 02/07/2018   HGBA1C 6.0 07/10/2023   MICROALBUR <0.7 07/10/2023   Assessment/Plan:  Daisy Dillon is a 78 y.o. Black or African American [2] female with  has a past medical history of Anemia, Arthritis, Diabetes mellitus, Diverticulosis of colon (without mention of hemorrhage), Elevated cholesterol, Fibroid, Gastritis, GERD (gastroesophageal reflux disease), Hiatal hernia, Hypertension, IBS (irritable bowel syndrome), Positive ANA (antinuclear antibody) (04/23/2012), Stricture and stenosis of esophagus (09/03/2005), and Urinary incontinence.  Encounter for well adult exam with abnormal findings Age and sex appropriate education and counseling updated with regular exercise and diet Referrals for preventative services - declines GYN referral for now Immunizations addressed - declines covid booster, for tdap at the pharmacy Smoking counseling  - none  needed Evidence for depression or other mood disorder - none significant Most recent labs reviewed. I have personally reviewed and have noted: 1) the patient's medical and social history 2) The patient's current medications and supplements 3) The patient's height, weight, and BMI have been recorded in the chart   Allergic rhinitis Mild to mod uncontrolled, for otc allegra and/or nasacort asd,,  to f/u any worsening symptoms or concerns  Diabetes (HCC) Lab Results  Component Value Date   HGBA1C 6.0 07/10/2023   Stable, pt to continue current medical treatment metformin 500 every day, actos 15 qd    Hyperlipidemia Lab Results  Component Value Date   LDLCALC 73 07/10/2023   Uncontrolled, goal ldl < 70, pt to continue current pravachol 20 every day and lower chol diet, declines other change for now   Hypertension BP Readings from Last 3 Encounters:  07/10/23 122/74  04/20/23 132/84  02/17/23 138/84   Stable, pt to continue medical treatment  ziac 2.5 6.25 mg every day, losatan 50 every day, calan sr 250 qd  Followup: Return in about 6 months (around 01/07/2024).  Oliver Barre, MD 07/14/2023 9:09 PM [facility]

## 2023-07-10 NOTE — Patient Instructions (Signed)
Please continue all other medications as before, and refills have been done if requested.  Please have the pharmacy call with any other refills you may need.  Please continue your efforts at being more active, low cholesterol diet, and weight control.  You are otherwise up to date with prevention measures today.  Please keep your appointments with your specialists as you may have planned  Please make an Appointment to return in 6 months, or sooner if needed

## 2023-07-11 ENCOUNTER — Other Ambulatory Visit: Payer: Self-pay | Admitting: Internal Medicine

## 2023-07-11 DIAGNOSIS — E7800 Pure hypercholesterolemia, unspecified: Secondary | ICD-10-CM

## 2023-07-13 ENCOUNTER — Other Ambulatory Visit: Payer: Self-pay | Admitting: Internal Medicine

## 2023-07-14 ENCOUNTER — Encounter: Payer: Self-pay | Admitting: Internal Medicine

## 2023-07-14 NOTE — Assessment & Plan Note (Signed)
Lab Results  Component Value Date   LDLCALC 73 07/10/2023   Uncontrolled, goal ldl < 70, pt to continue current pravachol 20 every day and lower chol diet, declines other change for now

## 2023-07-14 NOTE — Assessment & Plan Note (Signed)
Mild to mod uncontrolled, for otc allegra and/or nasacort asd,,  to f/u any worsening symptoms or concerns

## 2023-07-14 NOTE — Assessment & Plan Note (Signed)
BP Readings from Last 3 Encounters:  07/10/23 122/74  04/20/23 132/84  02/17/23 138/84   Stable, pt to continue medical treatment  ziac 2.5 6.25 mg every day, losatan 50 every day, calan sr 250 qd

## 2023-07-14 NOTE — Assessment & Plan Note (Signed)
Lab Results  Component Value Date   HGBA1C 6.0 07/10/2023   Stable, pt to continue current medical treatment metformin 500 every day, actos 15 qd

## 2023-07-14 NOTE — Assessment & Plan Note (Signed)
Age and sex appropriate education and counseling updated with regular exercise and diet Referrals for preventative services - declines GYN referral for now Immunizations addressed - declines covid booster, for tdap at the pharmacy Smoking counseling  - none needed Evidence for depression or other mood disorder - none significant Most recent labs reviewed. I have personally reviewed and have noted: 1) the patient's medical and social history 2) The patient's current medications and supplements 3) The patient's height, weight, and BMI have been recorded in the chart

## 2023-08-21 ENCOUNTER — Other Ambulatory Visit: Payer: Self-pay | Admitting: Internal Medicine

## 2023-08-21 DIAGNOSIS — E7800 Pure hypercholesterolemia, unspecified: Secondary | ICD-10-CM

## 2023-08-25 ENCOUNTER — Telehealth: Payer: Self-pay | Admitting: Gastroenterology

## 2023-08-25 MED ORDER — DICYCLOMINE HCL 10 MG PO CAPS: ORAL_CAPSULE | ORAL | 0 refills | Status: DC

## 2023-08-25 NOTE — Telephone Encounter (Signed)
 Patient called and stated that she needed to make an appointment for her Dicyclomine to be refilled. Patient is now wanting to know if she can get a refill for the Dicyclomine until she was able to be seen. Patient appointment was made for April 15 th at 3:00. Please advise.

## 2023-08-25 NOTE — Telephone Encounter (Signed)
 Prescription sent to patient's pharmacy until scheduled appt.

## 2023-08-26 ENCOUNTER — Other Ambulatory Visit: Payer: Self-pay

## 2023-08-26 MED ORDER — DICYCLOMINE HCL 10 MG PO CAPS: ORAL_CAPSULE | ORAL | 0 refills | Status: DC

## 2023-08-28 ENCOUNTER — Other Ambulatory Visit: Payer: Self-pay

## 2023-08-28 MED ORDER — DICYCLOMINE HCL 10 MG PO CAPS: ORAL_CAPSULE | ORAL | 0 refills | Status: DC

## 2023-08-28 NOTE — Telephone Encounter (Signed)
 Patient called and stated that she needs her Dicyclomine to be sent to [facility] because she is needing a 90 day supply, and she only has two pills left. Patient stated that she does have a death in the family and she can not be going back and forth with [place]. Please advise.

## 2023-08-28 NOTE — Telephone Encounter (Signed)
 Prescription sent to [facility] per patient's request.

## 2023-09-03 ENCOUNTER — Other Ambulatory Visit: Payer: Self-pay | Admitting: Internal Medicine

## 2023-09-03 DIAGNOSIS — E7800 Pure hypercholesterolemia, unspecified: Secondary | ICD-10-CM

## 2023-09-17 ENCOUNTER — Other Ambulatory Visit: Payer: Self-pay

## 2023-09-17 ENCOUNTER — Other Ambulatory Visit: Payer: Self-pay | Admitting: Internal Medicine

## 2023-09-29 ENCOUNTER — Ambulatory Visit: Admitting: Gastroenterology

## 2023-10-02 ENCOUNTER — Other Ambulatory Visit: Payer: Self-pay | Admitting: Internal Medicine

## 2023-10-02 DIAGNOSIS — E7800 Pure hypercholesterolemia, unspecified: Secondary | ICD-10-CM

## 2023-10-05 ENCOUNTER — Other Ambulatory Visit: Payer: Self-pay | Admitting: Internal Medicine

## 2023-10-05 ENCOUNTER — Other Ambulatory Visit: Payer: Self-pay

## 2023-10-05 NOTE — Telephone Encounter (Signed)
 Copied from CRM [id number]. Topic: Clinical - Medication Refill >> Oct 05, 2023 11:39 AM Marlan Silva wrote: Most Recent Primary Care Visit:  Provider: Roslyn Coombe  Department: [facility]  Visit Type: OFFICE VISIT  Date: 07/10/2023  Medication: pioglitazone  (ACTOS ) 15 MG tablet  Has the patient contacted their pharmacy? Yes (Agent: If no, request that the patient contact the pharmacy for the refill. If patient does not wish to contact the pharmacy document the reason why and proceed with request.) (Agent: If yes, when and what did the pharmacy advise?)  Is this the correct pharmacy for this prescription? Yes If no, delete pharmacy and type the correct one.  This is the patient's preferred pharmacy:    [facility] - [place], [place] - [address] [facility] [address] [place] [address] [place] [address] Phone: [phone] Fax: [phone]   Has the prescription been filled recently? No  Is the patient out of the medication? Yes  Has the patient been seen for an appointment in the last year OR does the patient have an upcoming appointment? Yes  Can we respond through MyChart? Yes  Agent: Please be advised that Rx refills may take up to 3 business days. We ask that you follow-up with your pharmacy.

## 2023-10-09 ENCOUNTER — Ambulatory Visit (INDEPENDENT_AMBULATORY_CARE_PROVIDER_SITE_OTHER): Admitting: Gastroenterology

## 2023-10-09 ENCOUNTER — Encounter: Payer: Self-pay | Admitting: Gastroenterology

## 2023-10-09 VITALS — BP 122/78 | Ht 65.0 in | Wt 199.0 lb

## 2023-10-09 DIAGNOSIS — K449 Diaphragmatic hernia without obstruction or gangrene: Secondary | ICD-10-CM

## 2023-10-09 DIAGNOSIS — K219 Gastro-esophageal reflux disease without esophagitis: Secondary | ICD-10-CM

## 2023-10-09 DIAGNOSIS — K5730 Diverticulosis of large intestine without perforation or abscess without bleeding: Secondary | ICD-10-CM

## 2023-10-09 DIAGNOSIS — K580 Irritable bowel syndrome with diarrhea: Secondary | ICD-10-CM

## 2023-10-09 MED ORDER — DICYCLOMINE HCL 10 MG PO CAPS: ORAL_CAPSULE | ORAL | 0 refills | Status: DC

## 2023-10-09 MED ORDER — DEXLANSOPRAZOLE 60 MG PO CPDR: 60.0000 mg | DELAYED_RELEASE_CAPSULE | Freq: Every day | ORAL | 3 refills | Status: DC

## 2023-10-09 NOTE — Progress Notes (Signed)
 Chief Complaint:follow-up GERD, IBS Primary GI Doctor:(previously Dr. Sandrea Cruel) Dr. Venice Gillis  HPI: Daisy Dillon is a pleasant 78 year old female, previously established with Dr. Sandrea Cruel, who comes in today for medication refills.  She was last seen here June 2023 with by Amy, PA.    She has history of GERD, IBS, diverticular disease. Last colonoscopy was done in September 2019 with removal of two 5 to 6 mm polyps, noted to have moderate diverticulosis and internal hemorrhoids.  Path on the polyps consistent with hyperplastic polyps. Last EGD 2014, empiric Maloney dilation, normal-appearing esophagus, mild gastropathy, urease negative. Patient has been maintained on Dexilant  60 mg p.o. daily for chronic GERD and says this works well.  She is not having any issues with heartburn or indigestion.  She very occasionally will have some mild dysphagia type symptoms with very dry foods like bread but other than that is not having any significant dysphagia to solids pills or liquids. She also takes Bentyl  20 mg 3-4 times daily as needed for IBS symptoms.  She says most of the time she takes it 3 times a day before meals very occasionally will have an episode of diarrhea most of her bowel movements have been normal.    Interval History    Patient has history of GERD and taking Dexilant  60 mg po daily. Patient denies dysphagia. Patient denies nausea, vomiting, or weight loss.      Patient taking bentyl  20 mg three times daily for IBS which keeps her symptoms controlled. She has 2-3 formed stools per day. No blood in stool.   No new issues. She states she has current back issues but otherwise doing well.  Wt Readings from Last 3 Encounters:  10/09/23 199 lb (90.3 kg)  07/10/23 200 lb (90.7 kg)  04/20/23 201 lb (91.2 kg)    Past Medical History:  Diagnosis Date   Anemia    long ago   Arthritis    Diabetes mellitus    Diverticulosis of colon (without mention of hemorrhage)    Elevated cholesterol    Fibroid     Gastritis    GERD (gastroesophageal reflux disease)    Hiatal hernia    Hypertension    IBS (irritable bowel syndrome)    Positive ANA (antinuclear antibody) 04/23/2012   Stricture and stenosis of esophagus 09/03/2005   Urinary incontinence     Past Surgical History:  Procedure Laterality Date   ABDOMINAL HYSTERECTOMY  1985   BACK SURGERY  11/2016   CHOLECYSTECTOMY     COLONOSCOPY  12/15/2007   diverticulosis   ESOPHAGOGASTRODUODENOSCOPY  12/24/2012   normal    KNEE SURGERY Right    arthroscopic   UPPER GASTROINTESTINAL ENDOSCOPY      Current Outpatient Medications  Medication Sig Dispense Refill   Ascorbic Acid (VITAMIN C) 1000 MG tablet Take 1,000 mg by mouth daily.     aspirin 81 MG tablet Take 81 mg by mouth daily.     benzonatate  (TESSALON  PERLES) 100 MG capsule 1-2 tab by mouth every 8 hrs as needed for cough 60 capsule 1   bisoprolol -hydrochlorothiazide  (ZIAC ) 2.5-6.25 MG tablet TAKE 1 TABLET BY MOUTH DAILY 100 tablet 2   Blood Glucose Monitoring Suppl (ONETOUCH VERIO FLEX SYSTEM) w/Device KIT Use as instructed to check blood sugar once daily. DX:E11.9 1 kit 0   Cholecalciferol (VITAMIN D3) 1000 UNITS CAPS Take 1 capsule by mouth daily.     dexlansoprazole  (DEXILANT ) 60 MG capsule Take 1 capsule (60 mg total) by mouth daily. 90  capsule 3   dicyclomine  (BENTYL ) 10 MG capsule TAKE 2 CAPSULES BY MOUTH 3 TIMES DAILY BEFORE MEALS 540 capsule 0   glucose blood (ONETOUCH VERIO) test strip USE 1 STRIP TO CHECK GLUCOSE ONCE DAILY 100 each 2   Lancets MISC Use as directed once dialy E11.9 100 each 3   losartan  (COZAAR ) 50 MG tablet TAKE 1 TABLET BY MOUTH DAILY 90 tablet 3   metFORMIN  (GLUCOPHAGE ) 500 MG tablet TAKE 1 TABLET BY MOUTH DAILY  WITH BREAKFAST 90 tablet 3   Multiple Vitamin (MULTIVITAMIN) tablet Take 1 tablet by mouth daily.     pioglitazone  (ACTOS ) 15 MG tablet TAKE 1 TABLET BY MOUTH DAILY 90 tablet 3   potassium chloride  (KLOR-CON ) 10 MEQ tablet Take 1 tablet by mouth  once daily 90 tablet 0   pravastatin  (PRAVACHOL ) 20 MG tablet TAKE 1 TABLET BY MOUTH DAILY 90 tablet 3   verapamil  (CALAN -SR) 240 MG CR tablet Take 1 tablet (240 mg total) by mouth at bedtime. 90 tablet 3   fexofenadine  (ALLEGRA ) 180 MG tablet Take 1 tablet (180 mg total) by mouth daily. (Patient taking differently: Take 180 mg by mouth daily. As needed) 30 tablet 2   No current facility-administered medications for this visit.    Allergies as of 10/09/2023 - Review Complete 10/09/2023  Allergen Reaction Noted   Codeine phosphate  09/12/2005    Family History  Problem Relation Age of Onset   Diabetes Mother    Hypertension Mother    Other Father        on the job accident   Stroke Brother        x 2   Bipolar disorder Brother    Stroke Brother    Diabetes Brother    Breast cancer Maternal Aunt        Age 36's   Colon cancer Neg Hx    Stomach cancer Neg Hx    Throat cancer Neg Hx    Liver disease Neg Hx    Colon polyps Neg Hx    Esophageal cancer Neg Hx    Rectal cancer Neg Hx     Review of Systems:    Constitutional: No weight loss, fever, chills, weakness or fatigue HEENT: Eyes: No change in vision               Ears, Nose, Throat:  No change in hearing or congestion Skin: No rash or itching Cardiovascular: No chest pain, chest pressure or palpitations   Respiratory: No SOB or cough Gastrointestinal: See HPI and otherwise negative Genitourinary: No dysuria or change in urinary frequency Neurological: No headache, dizziness or syncope Musculoskeletal: No new muscle or joint pain Hematologic: No bleeding or bruising Psychiatric: No history of depression or anxiety    Physical Exam:  Vital signs: BP 122/78   Ht 5\' 5"  (1.651 m)   Wt 199 lb (90.3 kg)   BMI 33.12 kg/m   Constitutional:   Pleasant A.A.  female appears to be in NAD, Well developed, Well nourished, alert and cooperative Throat: Oral cavity and pharynx without inflammation, swelling or lesion.   Respiratory: Respirations even and unlabored. Lungs clear to auscultation bilaterally.   No wheezes, crackles, or rhonchi.  Cardiovascular: Normal S1, S2. Regular rate and rhythm. No peripheral edema, cyanosis or pallor.  Gastrointestinal:  Soft, nondistended, nontender. No rebound or guarding. Normal bowel sounds. No appreciable masses or hepatomegaly. Rectal:  Not performed.  Msk:  Symmetrical without gross deformities. Without edema, no deformity or joint  abnormality.  Neurologic:  Alert and  oriented x4;  grossly normal neurologically.  Skin:   Dry and intact without significant lesions or rashes. Psychiatric: Oriented to person, place and time. Demonstrates good judgement and reason without abnormal affect or behaviors.  RELEVANT LABS AND IMAGING: CBC    Latest Ref Rng & Units 07/10/2023    1:53 PM 01/07/2023    2:40 PM 08/07/2022   11:10 AM  CBC  WBC 4.0 - 10.5 K/uL 3.3  3.5  3.1   Hemoglobin 12.0 - 15.0 g/dL 16.1  09.6  04.5   Hematocrit 36.0 - 46.0 % 41.1  40.5  41.0   Platelets 150.0 - 400.0 K/uL 235.0  243.0  232.0      CMP     Latest Ref Rng & Units 07/10/2023    1:53 PM 01/07/2023    2:40 PM 08/07/2022   11:10 AM  CMP  Glucose 70 - 99 mg/dL 86  96  86   BUN 6 - 23 mg/dL 16  18  20    Creatinine 0.40 - 1.20 mg/dL 4.09  8.11  9.14   Sodium 135 - 145 mEq/L 140  139  141   Potassium 3.5 - 5.1 mEq/L 3.9  4.2  3.8   Chloride 96 - 112 mEq/L 106  104  106   CO2 19 - 32 mEq/L 24  28  25    Calcium 8.4 - 10.5 mg/dL 9.8  78.2  95.6   Total Protein 6.0 - 8.3 g/dL 7.8  8.0  7.9   Total Bilirubin 0.2 - 1.2 mg/dL 0.3  0.3  0.3   Alkaline Phos 39 - 117 U/L 102  105  98   AST 0 - 37 U/L 32  34  29   ALT 0 - 35 U/L 32  34  31      Lab Results  Component Value Date   TSH 1.51 07/10/2023   Assessment: Encounter Diagnoses  Name Primary?   Irritable bowel syndrome with diarrhea Yes   Hiatal hernia with GERD without esophagitis    Diverticulosis of colon    78 year old female  with chronic GERD, stable on Dexilant  60 mg p.o. every morning for maintenance. IBS-well-controlled with dicyclomine  20 mg 3 times daily as needed colon cancer surveillance-up-to-date last colonoscopy September 2019 2 hyperplastic polyps. No new issues.   Plan: -Continue Dexilant  60 mg po daily , refill -Continue dicyclomine  20 mg three times daily, refill  -follow-up 1 year and prn  Thank you for the courtesy of this consult. Please call me with any questions or concerns.   Daisy Hendrix, FNP-C [facility] 10/09/2023, 3:22 PM  Cc: Roslyn Coombe, MD

## 2023-10-09 NOTE — Patient Instructions (Signed)
 Continue Dexilant  60 mg po daily , [facility] Continue dicyclomine  20 mg three times daily, refill sent to [facility]

## 2023-10-14 ENCOUNTER — Telehealth: Payer: Self-pay | Admitting: Internal Medicine

## 2023-10-14 NOTE — Telephone Encounter (Signed)
 Left handicap placard form to be filled out - please call when ready - form placed in Dr. Koleen Perna box

## 2023-10-16 NOTE — Telephone Encounter (Signed)
 Placed on provider desk

## 2023-10-16 NOTE — Telephone Encounter (Signed)
 Called and let Pt know it was ready , stated she would like it mailed to her it has been placed in the bin to be mailed out.

## 2023-11-21 ENCOUNTER — Other Ambulatory Visit: Payer: Self-pay | Admitting: Internal Medicine

## 2023-12-10 ENCOUNTER — Other Ambulatory Visit: Payer: Self-pay | Admitting: Internal Medicine

## 2023-12-13 ENCOUNTER — Other Ambulatory Visit: Payer: Self-pay | Admitting: Internal Medicine

## 2023-12-14 ENCOUNTER — Other Ambulatory Visit: Payer: Self-pay

## 2024-01-01 ENCOUNTER — Ambulatory Visit
Admission: RE | Admit: 2024-01-01 | Discharge: 2024-01-01 | Disposition: A | Discharge status: Home / Self Care | Source: Ambulatory Visit | Attending: Internal Medicine | Admitting: Internal Medicine

## 2024-01-01 DIAGNOSIS — N6310 Unspecified lump in the right breast, unspecified quadrant: Secondary | ICD-10-CM

## 2024-01-01 DIAGNOSIS — N6002 Solitary cyst of left breast: Secondary | ICD-10-CM | POA: Diagnosis not present

## 2024-01-01 DIAGNOSIS — N6312 Unspecified lump in the right breast, upper inner quadrant: Secondary | ICD-10-CM | POA: Diagnosis not present

## 2024-01-06 ENCOUNTER — Other Ambulatory Visit: Payer: Self-pay | Admitting: Internal Medicine

## 2024-01-07 ENCOUNTER — Ambulatory Visit: Payer: Self-pay | Admitting: Internal Medicine

## 2024-01-07 ENCOUNTER — Encounter: Payer: Self-pay | Admitting: Internal Medicine

## 2024-01-07 ENCOUNTER — Ambulatory Visit (INDEPENDENT_AMBULATORY_CARE_PROVIDER_SITE_OTHER): Payer: Medicare Other | Admitting: Internal Medicine

## 2024-01-07 VITALS — BP 134/76 | HR 75 | Temp 97.8°F | Ht 65.0 in | Wt 199.0 lb

## 2024-01-07 DIAGNOSIS — G2581 Restless legs syndrome: Secondary | ICD-10-CM | POA: Diagnosis not present

## 2024-01-07 DIAGNOSIS — M19042 Primary osteoarthritis, left hand: Secondary | ICD-10-CM | POA: Diagnosis not present

## 2024-01-07 DIAGNOSIS — M19041 Primary osteoarthritis, right hand: Secondary | ICD-10-CM

## 2024-01-07 DIAGNOSIS — I10 Essential (primary) hypertension: Secondary | ICD-10-CM | POA: Diagnosis not present

## 2024-01-07 DIAGNOSIS — E7800 Pure hypercholesterolemia, unspecified: Secondary | ICD-10-CM

## 2024-01-07 DIAGNOSIS — E119 Type 2 diabetes mellitus without complications: Secondary | ICD-10-CM | POA: Diagnosis not present

## 2024-01-07 DIAGNOSIS — R040 Epistaxis: Secondary | ICD-10-CM | POA: Diagnosis not present

## 2024-01-07 DIAGNOSIS — Z7984 Long term (current) use of oral hypoglycemic drugs: Secondary | ICD-10-CM

## 2024-01-07 DIAGNOSIS — N1831 Chronic kidney disease, stage 3a: Secondary | ICD-10-CM | POA: Diagnosis not present

## 2024-01-07 LAB — CBC WITH DIFFERENTIAL/PLATELET
Basophils Absolute: 0 K/uL (ref 0.0–0.1)
Basophils Relative: 0.6 % (ref 0.0–3.0)
Eosinophils Absolute: 0.1 K/uL (ref 0.0–0.7)
Eosinophils Relative: 2 % (ref 0.0–5.0)
HCT: 39.6 % (ref 36.0–46.0)
Hemoglobin: 13 g/dL (ref 12.0–15.0)
Lymphocytes Relative: 37.5 % (ref 12.0–46.0)
Lymphs Abs: 1.2 K/uL (ref 0.7–4.0)
MCHC: 33 g/dL (ref 30.0–36.0)
MCV: 86.6 fl (ref 78.0–100.0)
Monocytes Absolute: 0.2 K/uL (ref 0.1–1.0)
Monocytes Relative: 7.3 % (ref 3.0–12.0)
Neutro Abs: 1.6 K/uL (ref 1.4–7.7)
Neutrophils Relative %: 52.6 % (ref 43.0–77.0)
Platelets: 237 K/uL (ref 150.0–400.0)
RBC: 4.57 Mil/uL (ref 3.87–5.11)
RDW: 13.9 % (ref 11.5–15.5)
WBC: 3.1 K/uL — ABNORMAL LOW (ref 4.0–10.5)

## 2024-01-07 LAB — LIPID PANEL
Cholesterol: 131 mg/dL (ref 0–200)
HDL: 60.8 mg/dL (ref >39.00)
LDL Cholesterol: 55 mg/dL (ref 0–99)
NonHDL: 70.4
Total CHOL/HDL Ratio: 2
Triglycerides: 77 mg/dL (ref 0.0–149.0)
VLDL: 15.4 mg/dL (ref 0.0–40.0)

## 2024-01-07 LAB — HEPATIC FUNCTION PANEL
ALT: 33 U/L (ref 0–35)
AST: 32 U/L (ref 0–37)
Albumin: 4.1 g/dL (ref 3.5–5.2)
Alkaline Phosphatase: 99 U/L (ref 39–117)
Bilirubin, Direct: 0.1 mg/dL (ref 0.0–0.3)
Total Bilirubin: 0.4 mg/dL (ref 0.2–1.2)
Total Protein: 7.8 g/dL (ref 6.0–8.3)

## 2024-01-07 LAB — BASIC METABOLIC PANEL WITH GFR
BUN: 17 mg/dL (ref 6–23)
CO2: 26 meq/L (ref 19–32)
Calcium: 9.7 mg/dL (ref 8.4–10.5)
Chloride: 107 meq/L (ref 96–112)
Creatinine, Ser: 1.12 mg/dL (ref 0.40–1.20)
GFR: 46.96 mL/min — ABNORMAL LOW (ref >60.00)
Glucose, Bld: 90 mg/dL (ref 70–99)
Potassium: 3.6 meq/L (ref 3.5–5.1)
Sodium: 141 meq/L (ref 135–145)

## 2024-01-07 LAB — PROTIME-INR
INR: 1.1 ratio — ABNORMAL HIGH (ref 0.8–1.0)
Prothrombin Time: 11.7 s (ref 9.6–13.1)

## 2024-01-07 LAB — HEMOGLOBIN A1C: Hgb A1c MFr Bld: 6 % (ref 4.6–6.5)

## 2024-01-07 MED ORDER — GABAPENTIN 300 MG PO CAPS: ORAL_CAPSULE | ORAL | 1 refills | Status: DC

## 2024-01-07 NOTE — Patient Instructions (Signed)
 Ok to use the Voltaren gel for the hand pains  Please take all new medication as prescribed - the gabapentin  300 mg at bedtime only  Please continue all other medications as before, and refills have been done if requested.  Please have the pharmacy call with any other refills you may need.  Please keep your appointments with your specialists as you may have planned  Please go to the LAB at the blood drawing area for the tests to be done  You will be contacted by phone if any changes need to be made immediately.  Otherwise, you will receive a letter about your results with an explanation, but please check with MyChart first.  Please make an Appointment to return in 6 months, or sooner if needed

## 2024-01-07 NOTE — Progress Notes (Signed)
 The test results show that your current treatment is OK, as the tests are stable.  Please continue the same plan.  There is no other need for change of treatment or further evaluation based on these results, at this time.  thanks

## 2024-01-07 NOTE — Progress Notes (Signed)
 Patient ID: Daisy Dillon, female   DOB: [birth year], 78 y.o.   MRN: [id number]        Chief Complaint: follow up hand arthritis, RLS, ckd3a, nosebleeds, hld, htn, dm       HPI:  Daisy Dillon is a 78 y.o. female here overall doing ok, Pt denies chest pain, increased sob or doe, wheezing, orthopnea, PND, increased LE swelling, palpitations, dizziness or syncope.   Pt denies polydipsia, polyuria, or new focal neuro s/s.   Has had several nosebleeds recently but manageable.  Has worsening hand arthritis pain bilateral in the past wk but no swelling or overuse.  Does also have recent onset RLS type symptoms creepy crawly jumpy legs to trying to get to sleep.         Wt Readings from Last 3 Encounters:  01/07/24 199 lb (90.3 kg)  10/09/23 199 lb (90.3 kg)  07/10/23 200 lb (90.7 kg)   BP Readings from Last 3 Encounters:  01/07/24 134/76  10/09/23 122/78  07/10/23 122/74         Past Medical History:  Diagnosis Date   Anemia    long ago   Arthritis    Diabetes mellitus    Diverticulosis of colon (without mention of hemorrhage)    Elevated cholesterol    Fibroid    Gastritis    GERD (gastroesophageal reflux disease)    Hiatal hernia    Hypertension    IBS (irritable bowel syndrome)    Positive ANA (antinuclear antibody) 04/23/2012   Stricture and stenosis of esophagus 09/03/2005   Urinary incontinence    Past Surgical History:  Procedure Laterality Date   ABDOMINAL HYSTERECTOMY  1985   BACK SURGERY  11/2016   CHOLECYSTECTOMY     COLONOSCOPY  12/15/2007   diverticulosis   ESOPHAGOGASTRODUODENOSCOPY  12/24/2012   normal    KNEE SURGERY Right    arthroscopic   UPPER GASTROINTESTINAL ENDOSCOPY      reports that she has never smoked. She has never used smokeless tobacco. She reports that she does not drink alcohol and does not use drugs. family history includes Bipolar disorder in her brother; Breast cancer in her maternal aunt; Diabetes in her brother and mother;  Hypertension in her mother; Other in her father; Stroke in her brother and brother. Allergies  Allergen Reactions   Codeine Phosphate     REACTION: unspecified, nausea, vomiting   Current Outpatient Medications on File Prior to Visit  Medication Sig Dispense Refill   Ascorbic Acid (VITAMIN C) 1000 MG tablet Take 1,000 mg by mouth daily.     aspirin 81 MG tablet Take 81 mg by mouth daily.     benzonatate  (TESSALON  PERLES) 100 MG capsule 1-2 tab by mouth every 8 hrs as needed for cough 60 capsule 1   bisoprolol -hydrochlorothiazide  (ZIAC ) 2.5-6.25 MG tablet TAKE 1 TABLET BY MOUTH DAILY 100 tablet 2   Blood Glucose Monitoring Suppl (ONETOUCH VERIO FLEX SYSTEM) w/Device KIT Use as instructed to check blood sugar once daily. DX:E11.9 1 kit 0   Cholecalciferol (VITAMIN D3) 1000 UNITS CAPS Take 1 capsule by mouth daily.     dexlansoprazole  (DEXILANT ) 60 MG capsule Take 1 capsule (60 mg total) by mouth daily. 90 capsule 3   dicyclomine  (BENTYL ) 10 MG capsule TAKE 2 CAPSULES BY MOUTH 3 TIMES DAILY BEFORE MEALS 540 capsule 0   fexofenadine  (ALLEGRA ) 180 MG tablet Take 1 tablet (180 mg total) by mouth daily. (Patient taking differently: Take 180 mg by mouth  daily. As needed) 30 tablet 2   glucose blood (ONETOUCH VERIO) test strip USE 1 STRIP TO CHECK GLUCOSE ONCE DAILY 100 each 2   Lancets MISC Use as directed once dialy E11.9 100 each 3   losartan  (COZAAR ) 50 MG tablet TAKE 1 TABLET BY MOUTH DAILY 90 tablet 3   Multiple Vitamin (MULTIVITAMIN) tablet Take 1 tablet by mouth daily.     pioglitazone  (ACTOS ) 15 MG tablet TAKE 1 TABLET BY MOUTH DAILY 90 tablet 3   potassium chloride  (KLOR-CON ) 10 MEQ tablet Take 1 tablet by mouth once daily 90 tablet 0   pravastatin  (PRAVACHOL ) 20 MG tablet TAKE 1 TABLET BY MOUTH DAILY 90 tablet 3   verapamil  (CALAN -SR) 240 MG CR tablet Take 1 tablet (240 mg total) by mouth at bedtime. 90 tablet 3   metFORMIN  (GLUCOPHAGE ) 500 MG tablet TAKE 1 TABLET BY MOUTH DAILY  WITH  BREAKFAST 100 tablet 2   No current facility-administered medications on file prior to visit.        ROS:  All others reviewed and negative.  Objective        PE:  BP 134/76   Pulse 75   Temp 97.8 F (36.6 C)   Ht 5' 5 (1.651 m)   Wt 199 lb (90.3 kg)   SpO2 98%   BMI 33.12 kg/m                 Constitutional: Pt appears in NAD               HENT: Head: NCAT.                Right Ear: External ear normal.                 Left Ear: External ear normal.                Eyes: . Pupils are equal, round, and reactive to light. Conjunctivae and EOM are normal               Nose: without d/c or deformity               Neck: Neck supple. Gross normal ROM               Cardiovascular: Normal rate and regular rhythm.                 Pulmonary/Chest: Effort normal and breath sounds without rales or wheezing.                Abd:  Soft, NT, ND, + BS, no organomegaly               Neurological: Pt is alert. At baseline orientation, motor grossly intact               Skin: Skin is warm. No rashes, no other new lesions, LE edema - none               Psychiatric: Pt behavior is normal without agitation   Micro: none  Cardiac tracings I have personally interpreted today:  none  Pertinent Radiological findings (summarize): none   Lab Results  Component Value Date   WBC 3.1 (L) 01/07/2024   HGB 13.0 01/07/2024   HCT 39.6 01/07/2024   PLT 237.0 01/07/2024   GLUCOSE 90 01/07/2024   CHOL 131 01/07/2024   TRIG 77.0 01/07/2024   HDL 60.80 01/07/2024   LDLCALC 55 01/07/2024   ALT 33  01/07/2024   AST 32 01/07/2024   NA 141 01/07/2024   K 3.6 01/07/2024   CL 107 01/07/2024   CREATININE 1.12 01/07/2024   BUN 17 01/07/2024   CO2 26 01/07/2024   TSH 1.51 07/10/2023   INR 1.1 (H) 01/07/2024   HGBA1C 6.0 01/07/2024   Assessment/Plan:  Daisy Dillon is a 78 y.o. Black or African American [2] female with  has a past medical history of Anemia, Arthritis, Diabetes mellitus,  Diverticulosis of colon (without mention of hemorrhage), Elevated cholesterol, Fibroid, Gastritis, GERD (gastroesophageal reflux disease), Hiatal hernia, Hypertension, IBS (irritable bowel syndrome), Positive ANA (antinuclear antibody) (04/23/2012), Stricture and stenosis of esophagus (09/03/2005), and Urinary incontinence.  Diabetes (HCC) Lab Results  Component Value Date   HGBA1C 6.0 01/07/2024   Stable, pt to continue current medical treatment actos  15 every day, metformin  500 qd   Frequent nosebleeds Also for PT INR  Hyperlipidemia Lab Results  Component Value Date   LDLCALC 55 01/07/2024   Stable, pt to continue current statin pravachol  20 qd   Hypertension BP Readings from Last 3 Encounters:  01/07/24 134/76  10/09/23 122/78  07/10/23 122/74   Stable, pt to continue medical treatment ziac  2.5 6.25 every day, losartan  50 every day,    RLS (restless legs syndrome) Recent onset, for gabapentin  300 mg at bedtime prn  CKD (chronic kidney disease) stage 3, GFR 30-59 ml/min (HCC) Lab Results  Component Value Date   CREATININE 1.12 01/07/2024   Stable overall, cont to avoid nephrotoxins   Hand arthritis Pt enocuraged for otc voltaren gel prn  Followup: Return in about 6 months (around 07/09/2024).  Lynwood Rush, MD 01/09/2024 1:54 PM [facility]

## 2024-01-08 ENCOUNTER — Telehealth: Payer: Self-pay | Admitting: Internal Medicine

## 2024-01-08 MED ORDER — GABAPENTIN 100 MG PO CAPS: ORAL_CAPSULE | ORAL | 5 refills | Status: DC

## 2024-01-08 NOTE — Telephone Encounter (Signed)
 Ok to change to a lower dose  I sent a new rx for gabapentin  100 mg -   To take 1-2 tabs tid prn - done erx

## 2024-01-08 NOTE — Telephone Encounter (Signed)
 Copied from CRM [id number]. Topic: Clinical - Medication Question >> Jan 08, 2024  1:32 PM Shereese L wrote: Reason for CRM: patient is requesting a call in reference to gabapentin  (NEURONTIN ) 300 MG capsule and stated that she thinks the dosage is to high and she need to lower the dosage because it made her sleep all day

## 2024-01-09 ENCOUNTER — Encounter: Payer: Self-pay | Admitting: Internal Medicine

## 2024-01-09 DIAGNOSIS — N183 Chronic kidney disease, stage 3 unspecified: Secondary | ICD-10-CM | POA: Insufficient documentation

## 2024-01-09 DIAGNOSIS — M19049 Primary osteoarthritis, unspecified hand: Secondary | ICD-10-CM | POA: Insufficient documentation

## 2024-01-09 DIAGNOSIS — G2581 Restless legs syndrome: Secondary | ICD-10-CM | POA: Insufficient documentation

## 2024-01-09 NOTE — Assessment & Plan Note (Signed)
 Lab Results  Component Value Date   LDLCALC 55 01/07/2024   Stable, pt to continue current statin pravachol  20 qd

## 2024-01-09 NOTE — Assessment & Plan Note (Signed)
 Recent onset, for gabapentin  300 mg at bedtime prn

## 2024-01-09 NOTE — Assessment & Plan Note (Signed)
 Lab Results  Component Value Date   CREATININE 1.12 01/07/2024   Stable overall, cont to avoid nephrotoxins

## 2024-01-09 NOTE — Assessment & Plan Note (Signed)
 Lab Results  Component Value Date   HGBA1C 6.0 01/07/2024   Stable, pt to continue current medical treatment actos  15 every day, metformin  500 qd

## 2024-01-09 NOTE — Assessment & Plan Note (Signed)
 Pt enocuraged for otc voltaren gel prn

## 2024-01-09 NOTE — Assessment & Plan Note (Signed)
 Also for PT INR

## 2024-01-09 NOTE — Assessment & Plan Note (Signed)
 BP Readings from Last 3 Encounters:  01/07/24 134/76  10/09/23 122/78  07/10/23 122/74   Stable, pt to continue medical treatment ziac  2.5 6.25 every day, losartan  50 every day,

## 2024-01-17 ENCOUNTER — Other Ambulatory Visit: Payer: Self-pay | Admitting: Internal Medicine

## 2024-02-18 ENCOUNTER — Telehealth: Payer: Self-pay

## 2024-02-18 NOTE — Telephone Encounter (Signed)
 Opened in error

## 2024-02-18 NOTE — Telephone Encounter (Signed)
 Copied from CRM [id number]. Topic: General - Call Back - No Documentation >> Feb 18, 2024  2:02 PM Daisy Dillon wrote: Reason for CRM: pt called back due to missed call for [facility] appt. Requesting callback

## 2024-02-22 ENCOUNTER — Other Ambulatory Visit: Payer: Self-pay | Admitting: Physician Assistant

## 2024-02-22 DIAGNOSIS — K219 Gastro-esophageal reflux disease without esophagitis: Secondary | ICD-10-CM

## 2024-03-11 ENCOUNTER — Telehealth: Payer: Self-pay | Admitting: Gastroenterology

## 2024-03-11 DIAGNOSIS — K580 Irritable bowel syndrome with diarrhea: Secondary | ICD-10-CM

## 2024-03-11 MED ORDER — DICYCLOMINE HCL 10 MG PO CAPS: ORAL_CAPSULE | ORAL | 0 refills | Status: DC

## 2024-03-11 NOTE — Telephone Encounter (Signed)
 Inbound call from patient requesting to get a refill on her Dicyclomine  and sent over to her mail order pharmacy [facility]. Please advise.

## 2024-03-11 NOTE — Telephone Encounter (Signed)
Refill sent to [facility] 

## 2024-03-14 NOTE — Telephone Encounter (Unsigned)
 Copied from CRM [id number]. Topic: Clinical - Medication Refill >> Mar 14, 2024  2:39 PM Harlene ORN wrote: Medication: potassium chloride  (KLOR-CON ) 10 MEQ tablet  Has the patient contacted their pharmacy? No (Agent: If no, request that the patient contact the pharmacy for the refill. If patient does not wish to contact the pharmacy document the reason why and proceed with request.) (Agent: If yes, when and what did the pharmacy advise?)  This is the patient's preferred pharmacy:  [facility] [address] ([address] [place] - [address] [place] ([place]) [place] [address] Phone: [phone] Fax: [phone]  Is this the correct pharmacy for this prescription? Yes If no, delete pharmacy and type the correct one.   Has the prescription been filled recently? No  Is the patient out of the medication? No  Has the patient been seen for an appointment in the last year OR does the patient have an upcoming appointment? Yes  Can we respond through MyChart? No  Agent: Please be advised that Rx refills may take up to 3 business days. We ask that you follow-up with your pharmacy.

## 2024-03-15 ENCOUNTER — Other Ambulatory Visit: Payer: Self-pay

## 2024-03-15 MED ORDER — POTASSIUM CHLORIDE ER 10 MEQ PO TBCR: 10.0000 meq | EXTENDED_RELEASE_TABLET | Freq: Every day | ORAL | 0 refills | Status: DC

## 2024-03-15 NOTE — Telephone Encounter (Signed)
Refill has been sent today 

## 2024-04-12 ENCOUNTER — Ambulatory Visit: Payer: Self-pay

## 2024-04-12 NOTE — Telephone Encounter (Signed)
 FYI Only or Action Required?: FYI only for provider.  Patient was last seen in primary care on 01/07/2024 by Norleen Lynwood ORN, MD.  Called Nurse Triage reporting Epistaxis.  Symptoms began several months ago.  Interventions attempted: Rest, hydration, or home remedies.  Symptoms are: unchanged.  Triage Disposition: See PCP Within 2 Weeks  Patient/caregiver understands and will follow disposition?: Yes  **Appt. Scheduled for 10/29**          Copied from CRM #[id number]. Topic: Clinical - Red Word Triage >> Apr 12, 2024 11:13 AM Frederich PARAS wrote: [facility] that prompted transfer to Nurse Triage: bleeding  pt wants a nurse to call her,constantly for  last few months nose bleeds while sleep. sunday was last one, she can be eating or in bed and she have blood coming from  nose Reason for Disposition  Hard-to-stop nosebleeds are a chronic symptom (recurrent or ongoing AND present > 4 weeks)  Answer Assessment - Initial Assessment Questions 1. AMOUNT OF BLEEDING: How bad is the bleeding? How much blood was lost? Has the bleeding stopped?     Not currently having a nose bleed, last one was last Sunday   2. ONSET: When did the nosebleed start?      Intermittently for a few months   3. FREQUENCY: How many nosebleeds have you had in the last 24 hours?      None   4. RECURRENT SYMPTOMS: Have there been other recent nosebleeds? If Yes, ask: How long did it take you to stop the bleeding? What worked best?      Last Sunday   5. CAUSE: What do you think caused this nosebleed?     Unsure   6. LOCAL FACTORS: Do you have any cold symptoms?, Have you been rubbing or picking at your nose?     No   7. SYSTEMIC FACTORS: Do you have high blood pressure or any bleeding problems?     HTN, controlled   8. BLOOD THINNERS: Do you take any blood thinners? (e.g., aspirin, clopidogrel / Plavix, coumadin, heparin). Notes: Other strong blood thinners include: Arixtra  (fondaparinux), Eliquis (apixaban), Pradaxa (dabigatran), and Xarelto (rivaroxaban).     No   9. OTHER SYMPTOMS: Do you have any other symptoms? (e.g., lightheadedness) No.   Patient would like to follow up with provider as she's had reoccurring nose bleeds that start out of the blue. No other symptoms noted. Appt. Scheduled for 10/29 for evaluation.  Protocols used: Nosebleed-A-AH

## 2024-04-13 ENCOUNTER — Ambulatory Visit: Admitting: Emergency Medicine

## 2024-04-13 ENCOUNTER — Encounter: Payer: Self-pay | Admitting: Emergency Medicine

## 2024-04-13 VITALS — BP 122/80 | HR 73 | Temp 98.2°F | Ht 65.0 in | Wt 201.0 lb

## 2024-04-13 DIAGNOSIS — I10 Essential (primary) hypertension: Secondary | ICD-10-CM | POA: Diagnosis not present

## 2024-04-13 DIAGNOSIS — R040 Epistaxis: Secondary | ICD-10-CM

## 2024-04-13 NOTE — Assessment & Plan Note (Signed)
 Clinically stable.  No red flag signs or symptoms. Unremarkable examination Not on blood thinners Nosebleed management discussed ED precautions given May need ENT evaluation in the near future

## 2024-04-13 NOTE — Assessment & Plan Note (Signed)
 BP Readings from Last 3 Encounters:  04/13/24 122/80  01/07/24 134/76  10/09/23 122/78  Well-controlled hypertension Continue verapamil  CR 240 mg daily and Ziac  2.5-6.25 mg daily

## 2024-04-13 NOTE — Progress Notes (Signed)
 Daisy Dillon 78 y.o.   Chief Complaint  Patient presents with   Epistaxis    Has been going on since last month    HISTORY OF PRESENT ILLNESS: This is a 78 y.o. female complaining of intermittent episodes of nosebleeds for the past month.  About once every week No other associated symptoms Not taking anticoagulants at present time No other complaints or medical concerns today.  Epistaxis      Prior to Admission medications   Medication Sig Start Date End Date Taking? Authorizing Provider  Ascorbic Acid (VITAMIN C) 1000 MG tablet Take 1,000 mg by mouth daily.   Yes [provider]  aspirin 81 MG tablet Take 81 mg by mouth daily.   Yes [provider]  benzonatate  (TESSALON  PERLES) 100 MG capsule 1-2 tab by mouth every 8 hrs as needed for cough 01/31/22  Yes Norleen Lynwood ORN, MD  bisoprolol -hydrochlorothiazide  (ZIAC ) 2.5-6.25 MG tablet TAKE 1 TABLET BY MOUTH DAILY 12/11/23  Yes Norleen Lynwood ORN, MD  Blood Glucose Monitoring Suppl (ONETOUCH VERIO FLEX SYSTEM) w/Device KIT Use as instructed to check blood sugar once daily. DX:E11.9 05/01/23  Yes Norleen Lynwood ORN, MD  Cholecalciferol (VITAMIN D3) 1000 UNITS CAPS Take 1 capsule by mouth daily.   Yes [provider]  DEXILANT  60 MG capsule TAKE ONE CAPSULE BY MOUTH DAILY 02/22/24  Yes May, Deanna J, NP  dicyclomine  (BENTYL ) 10 MG capsule TAKE 2 CAPSULES BY MOUTH 3 TIMES DAILY BEFORE MEALS 03/11/24  Yes May, Deanna J, NP  fexofenadine  (ALLEGRA ) 180 MG tablet Take 1 tablet (180 mg total) by mouth daily. Patient taking differently: Take 180 mg by mouth daily. As needed 12/29/19 04/13/24 Yes Norleen Lynwood ORN, MD  gabapentin  (NEURONTIN ) 100 MG capsule Take 1-2 tabs by mouth three times per day as needed 01/08/24  Yes Norleen Lynwood ORN, MD  glucose blood [facility] VERIO) test strip USE 1 STRIP TO CHECK GLUCOSE ONCE DAILY 05/01/23  Yes Norleen Lynwood ORN, MD  Lancets MISC Use as directed once dialy E11.9 05/01/23  Yes Norleen Lynwood ORN, MD   losartan  (COZAAR ) 50 MG tablet TAKE 1 TABLET BY MOUTH DAILY 10/05/23  Yes Norleen Lynwood ORN, MD  metFORMIN  (GLUCOPHAGE ) 500 MG tablet TAKE 1 TABLET BY MOUTH DAILY  WITH BREAKFAST 01/08/24  Yes Norleen Lynwood ORN, MD  Multiple Vitamin (MULTIVITAMIN) tablet Take 1 tablet by mouth daily.   Yes [provider]  pioglitazone  (ACTOS ) 15 MG tablet TAKE 1 TABLET BY MOUTH DAILY 10/05/23  Yes Norleen Lynwood ORN, MD  potassium chloride  (KLOR-CON ) 10 MEQ tablet Take 1 tablet (10 mEq total) by mouth daily. 03/15/24  Yes Norleen Lynwood ORN, MD  pravastatin  (PRAVACHOL ) 20 MG tablet TAKE 1 TABLET BY MOUTH DAILY 10/05/23  Yes Norleen Lynwood ORN, MD  verapamil  (CALAN -SR) 240 MG CR tablet TAKE 1 TABLET BY MOUTH AT  BEDTIME 01/18/24  Yes Norleen Lynwood ORN, MD    Allergies  Allergen Reactions   Codeine Phosphate     REACTION: unspecified, nausea, vomiting    Patient Active Problem List   Diagnosis Date Noted   RLS (restless legs syndrome) 01/09/2024   CKD (chronic kidney disease) stage 3, GFR 30-59 ml/min (HCC) 01/09/2024   Hand arthritis 01/09/2024   Acute pain of left shoulder 02/09/2023   Frequent nosebleeds 01/10/2023   Bilateral carpal tunnel syndrome 08/05/2021   Plantar fasciitis 05/10/2020   Hypokalemia 12/31/2019   Right cervical radiculopathy 07/29/2018   Vertigo 02/11/2018   Cough 10/09/2017  Allergic rhinitis 10/09/2017   Paresthesia 11/08/2015   Bilateral hearing loss 01/25/2015   Left otitis media 01/25/2015   Lower back pain 04/26/2013   Pain in toe of left foot 11/11/2012   Positive ANA (antinuclear antibody) 04/23/2012   Encounter for well adult exam with abnormal findings 04/22/2012   Bilateral hand pain 04/22/2012   Hypertension    Fibroid    Urinary incontinence    Hiatal hernia    GERD with stricture 06/19/2011   Globus sensation 06/19/2011   Diverticulosis of colon 12/30/2007   HYPERTENSIVE CARDIOVASCULAR DISEASE 10/28/2007   GASTRITIS 10/28/2007   HIATAL HERNIA 10/28/2007   DEGENERATIVE  JOINT DISEASE 10/28/2007   Diabetes (HCC) 06/08/2007   Hyperlipidemia 06/08/2007   ANXIETY 06/08/2007   ESOPHAGEAL STRICTURE 06/08/2007   IBS 06/08/2007   Other tenosynovitis of hand and wrist 06/08/2007    Past Medical History:  Diagnosis Date   Anemia    long ago   Arthritis    Diabetes mellitus    Diverticulosis of colon (without mention of hemorrhage)    Elevated cholesterol    Fibroid    Gastritis    GERD (gastroesophageal reflux disease)    Hiatal hernia    Hypertension    IBS (irritable bowel syndrome)    Positive ANA (antinuclear antibody) 04/23/2012   Stricture and stenosis of esophagus 09/03/2005   Urinary incontinence     Past Surgical History:  Procedure Laterality Date   ABDOMINAL HYSTERECTOMY  1985   BACK SURGERY  11/2016   CHOLECYSTECTOMY     COLONOSCOPY  12/15/2007   diverticulosis   ESOPHAGOGASTRODUODENOSCOPY  12/24/2012   normal    KNEE SURGERY Right    arthroscopic   UPPER GASTROINTESTINAL ENDOSCOPY      Social History   Socioeconomic History   Marital status: Divorced    Spouse name: Not on file   Number of children: 2   Years of education: 13.5   Highest education level: Not on file  Occupational History   Occupation: Retired  Tobacco Use   Smoking status: Never   Smokeless tobacco: Never  Vaping Use   Vaping status: Never Used  Substance and Sexual Activity   Alcohol use: No    Alcohol/week: 0.0 standard drinks of alcohol   Drug use: No   Sexual activity: Not Currently    Birth control/protection: Surgical    Comment: 1ST intercourse- 17, partners- 4, hysterectomy  Other Topics Concern   Not on file  Social History Narrative   Lives alone   Right-handed   2 caffeinated drinks per day   Social Drivers of Health   Financial Resource Strain: Low Risk  (02/13/2023)   Overall Financial Resource Strain (CARDIA)    Difficulty of Paying Living Expenses: Not very hard  Food Insecurity: No Food Insecurity (02/13/2023)   Hunger  Vital Sign    Worried About Running Out of Food in the Last Year: Never true    Ran Out of Food in the Last Year: Never true  Transportation Needs: No Transportation Needs (02/13/2023)   PRAPARE - Administrator, Civil Service (Medical): No    Lack of Transportation (Non-Medical): No  Physical Activity: Inactive (02/13/2023)   Exercise Vital Sign    Days of Exercise per Week: 0 days    Minutes of Exercise per Session: 0 min  Stress: No Stress Concern Present (02/13/2023)   [employer] of Occupational Health - Occupational Stress Questionnaire    Feeling of Stress : Not  at all  Social Connections: Moderately Integrated (02/13/2023)   Social Connection and Isolation Panel    Frequency of Communication with Friends and Family: More than three times a week    Frequency of Social Gatherings with Friends and Family: More than three times a week    Attends Religious Services: 1 to 4 times per year    Active Member of [employer] or Organizations: No    Attends Engineer, Structural: 1 to 4 times per year    Marital Status: Divorced  Intimate Partner Violence: Patient Unable To Answer (02/13/2023)   Humiliation, Afraid, Rape, and Kick questionnaire    Fear of Current or Ex-Partner: Patient unable to answer    Emotionally Abused: Patient unable to answer    Physically Abused: Patient unable to answer    Sexually Abused: Patient unable to answer    Family History  Problem Relation Age of Onset   Diabetes Mother    Hypertension Mother    Other Father        on the job accident   Stroke Brother        x 2   Bipolar disorder Brother    Stroke Brother    Diabetes Brother    Breast cancer Maternal Aunt        Age 27's   Colon cancer Neg Hx    Stomach cancer Neg Hx    Throat cancer Neg Hx    Liver disease Neg Hx    Colon polyps Neg Hx    Esophageal cancer Neg Hx    Rectal cancer Neg Hx      Review of Systems  Constitutional: Negative.  Negative for chills and  fever.  HENT:  Positive for nosebleeds. Negative for sore throat.   Respiratory:  Negative for hemoptysis.   Cardiovascular:  Negative for chest pain and palpitations.  Gastrointestinal:  Negative for blood in stool and melena.  Genitourinary:  Negative for hematuria.  Neurological:  Negative for dizziness and headaches.    Today's Vitals   04/13/24 1359  BP: 122/80  Pulse: 73  Temp: 98.2 F (36.8 C)  TempSrc: Oral  SpO2: 98%  Weight: 201 lb (91.2 kg)  Height: 5' 5 (1.651 m)   Body mass index is 33.45 kg/m.   Physical Exam Vitals reviewed.  Constitutional:      Appearance: Normal appearance.  HENT:     Head: Normocephalic.     Nose:     Comments: Mild erythema right septum anteriorly    Mouth/Throat:     Mouth: Mucous membranes are moist.     Pharynx: Oropharynx is clear.  Eyes:     Extraocular Movements: Extraocular movements intact.     Pupils: Pupils are equal, round, and reactive to light.  Cardiovascular:     Rate and Rhythm: Normal rate.  Pulmonary:     Effort: Pulmonary effort is normal.  Skin:    General: Skin is warm and dry.     Capillary Refill: Capillary refill takes less than 2 seconds.  Neurological:     General: No focal deficit present.     Mental Status: She is alert and oriented to person, place, and time.  Psychiatric:        Mood and Affect: Mood normal.        Behavior: Behavior normal.      ASSESSMENT & PLAN: Problem List Items Addressed This Visit       Cardiovascular and Mediastinum   Hypertension   BP  Readings from Last 3 Encounters:  04/13/24 122/80  01/07/24 134/76  10/09/23 122/78  Well-controlled hypertension Continue verapamil  CR 240 mg daily and Ziac  2.5-6.25 mg daily         Other   Epistaxis - Primary   Clinically stable.  No red flag signs or symptoms. Unremarkable examination Not on blood thinners Nosebleed management discussed ED precautions given May need ENT evaluation in the near future       Patient Instructions  Nosebleed, Adult A nosebleed is when blood comes out of the nose. Nosebleeds are common and can be caused by many things. They are usually not a sign of a serious medical problem. Follow these instructions at home: When you have a nosebleed:  Sit down. Tilt your head forward a little. Follow these steps: Pinch your nose with a clean towel or tissue. Keep pinching your nose for 5 minutes. Do not let go. After 5 minutes, let go of your nose. Keep doing these steps until the bleeding stops. Do not put tissues or other things in your nose to stop the bleeding. Avoid lying down or putting your head back. Use a nose spray decongestant as told by your doctor. After a nosebleed: Try not to blow your nose or sniffle for several hours. Try not to strain, lift, or bend at the waist for several days. Aspirin and medicines that thin your blood make bleeding more likely. If you take these medicines: Ask your doctor if you should stop taking them or if you should change how much you take. Do not stop taking the medicine unless your doctor tells you to. If your nosebleed was caused by dryness, use over-the-counter saline nasal spray or gel and a humidifier as told by your doctor. This will keep the inside of your nose moist and allow it to heal. If you need to use nasal spray or gel: Choose one that is water-soluble. Use only as much as you need and use it only as often as needed. Do not lie down right away after you use it. If you get nosebleeds often, talk with your doctor about treatments. These may include: Nasal cautery. A chemical swab or electrical device is used to lightly burn tiny blood vessels inside the nose. This helps stop or prevent nosebleeds. Nasal packing. A gauze or other material is placed in the nose to keep constant pressure on the bleeding area. Contact a doctor if: You have a fever. You get nosebleeds often. You get nosebleeds more often than  usual. You bruise very easily. You have something stuck in your nose. You are bleeding in your mouth. You vomit or cough up brown material. You get a nosebleed after you start a new medicine. Get help right away if: You have a nosebleed after you fall or hurt your head. Your nosebleed does not go away after 20 minutes. You feel dizzy or weak. You have unusual bleeding from other parts of your body. You have unusual bruising on other parts of your body. You get sweaty. You vomit blood. Summary Nosebleeds are common. They are usually not a sign of a serious medical problem. When you have a nosebleed, sit down and tilt your head a little forward. Pinch your nose with a clean tissue for 5 minutes. Use saline spray or saline gel and a humidifier as told by your doctor. Get help right away if your nosebleed does not go away after 20 minutes. This information is not intended to replace advice given to you  by your health care provider. Make sure you discuss any questions you have with your health care provider. Document Revised: 06/11/2021 Document Reviewed: 06/11/2021 Elsevier Patient Education  2024 Elsevier Inc.      Emil Schaumann, MD [facility] at [facility]

## 2024-04-13 NOTE — Patient Instructions (Signed)
 Nosebleed, Adult A nosebleed is when blood comes out of the nose. Nosebleeds are common and can be caused by many things. They are usually not a sign of a serious medical problem. Follow these instructions at home: When you have a nosebleed:  Sit down. Tilt your head forward a little. Follow these steps: Pinch your nose with a clean towel or tissue. Keep pinching your nose for 5 minutes. Do not let go. After 5 minutes, let go of your nose. Keep doing these steps until the bleeding stops. Do not put tissues or other things in your nose to stop the bleeding. Avoid lying down or putting your head back. Use a nose spray decongestant as told by your doctor. After a nosebleed: Try not to blow your nose or sniffle for several hours. Try not to strain, lift, or bend at the waist for several days. Aspirin and medicines that thin your blood make bleeding more likely. If you take these medicines: Ask your doctor if you should stop taking them or if you should change how much you take. Do not stop taking the medicine unless your doctor tells you to. If your nosebleed was caused by dryness, use over-the-counter saline nasal spray or gel and a humidifier as told by your doctor. This will keep the inside of your nose moist and allow it to heal. If you need to use nasal spray or gel: Choose one that is water-soluble. Use only as much as you need and use it only as often as needed. Do not lie down right away after you use it. If you get nosebleeds often, talk with your doctor about treatments. These may include: Nasal cautery. A chemical swab or electrical device is used to lightly burn tiny blood vessels inside the nose. This helps stop or prevent nosebleeds. Nasal packing. A gauze or other material is placed in the nose to keep constant pressure on the bleeding area. Contact a doctor if: You have a fever. You get nosebleeds often. You get nosebleeds more often than usual. You bruise very  easily. You have something stuck in your nose. You are bleeding in your mouth. You vomit or cough up brown material. You get a nosebleed after you start a new medicine. Get help right away if: You have a nosebleed after you fall or hurt your head. Your nosebleed does not go away after 20 minutes. You feel dizzy or weak. You have unusual bleeding from other parts of your body. You have unusual bruising on other parts of your body. You get sweaty. You vomit blood. Summary Nosebleeds are common. They are usually not a sign of a serious medical problem. When you have a nosebleed, sit down and tilt your head a little forward. Pinch your nose with a clean tissue for 5 minutes. Use saline spray or saline gel and a humidifier as told by your doctor. Get help right away if your nosebleed does not go away after 20 minutes. This information is not intended to replace advice given to you by your health care provider. Make sure you discuss any questions you have with your health care provider. Document Revised: 06/11/2021 Document Reviewed: 06/11/2021 Elsevier Patient Education  2024 [employer].

## 2024-04-18 ENCOUNTER — Encounter: Payer: Self-pay | Admitting: Pharmacist

## 2024-04-18 NOTE — Progress Notes (Signed)
 Pharmacy Quality Measure Review  This patient is appearing on a report for being at risk of failing the adherence measure for cholesterol (statin) and hypertension (ACEi/ARB) medications this calendar year.   Medication: Pravastatin  and Losartan  Last fill date: 04/13/24 for 100 day supply  Insurance report was not up to date. No action needed at this time.   Darrelyn Drum, PharmD, BCPS, CPP Clinical Pharmacist Practitioner [facility] at [facility] [phone]

## 2024-04-28 ENCOUNTER — Ambulatory Visit

## 2024-05-02 ENCOUNTER — Other Ambulatory Visit: Payer: Self-pay

## 2024-05-02 ENCOUNTER — Other Ambulatory Visit: Payer: Self-pay | Admitting: Internal Medicine

## 2024-05-19 ENCOUNTER — Telehealth: Payer: Self-pay | Admitting: Gastroenterology

## 2024-05-19 ENCOUNTER — Other Ambulatory Visit: Payer: Self-pay | Admitting: Gastroenterology

## 2024-05-19 DIAGNOSIS — K580 Irritable bowel syndrome with diarrhea: Secondary | ICD-10-CM

## 2024-05-19 NOTE — Telephone Encounter (Signed)
 Spoke w pt about medication sample. Pt is requesting to know if she is able to pick up Dicyclomine  HCl  samples. Please advise.

## 2024-05-20 NOTE — Telephone Encounter (Signed)
 We do not have samples of dicyclomine .  Called the patient to advise. No answer. Left a message for her.

## 2024-06-10 ENCOUNTER — Ambulatory Visit

## 2024-06-13 ENCOUNTER — Ambulatory Visit

## 2024-06-13 ENCOUNTER — Telehealth: Payer: Self-pay

## 2024-06-13 VITALS — Ht 65.0 in | Wt 201.0 lb

## 2024-06-13 DIAGNOSIS — Z0000 Encounter for general adult medical examination without abnormal findings: Secondary | ICD-10-CM

## 2024-06-13 NOTE — Telephone Encounter (Signed)
 Copied from CRM [id number]. Topic: General - Other >> Jun 10, 2024  4:39 PM China J wrote: Reason for CRM: The patient's insurance is needing an agreement letter from Dr. Norleen verifying her chronic condition (diabetes), in order to keep plan coverage next year.  Please call patient at [phone] once done.She would like the letter sent via MyChart.

## 2024-06-13 NOTE — Progress Notes (Signed)
 "  Chief Complaint  Patient presents with   Medicare Wellness     Subjective:   Daisy Dillon is a 78 y.o. female who presents for a Medicare Annual Wellness Visit.  Visit info / Clinical Intake: Medicare Wellness Visit Type:: Subsequent Annual Wellness Visit Persons participating in visit and providing information:: patient Medicare Wellness Visit Mode:: Telephone If telephone:: video declined Since this visit was completed virtually, some vitals may be partially provided or unavailable. Missing vitals are due to the limitations of the virtual format.: Documented vitals are patient reported If Telephone or Video please confirm:: I connected with patient using audio/video enable telemedicine. I verified patient identity with two identifiers, discussed telehealth limitations, and patient agreed to proceed. Patient Location:: Home Provider Location:: Office Interpreter Needed?: No Pre-visit prep was completed: yes AWV questionnaire completed by patient prior to visit?: yes Date:: 06/13/24 Living arrangements:: (!) lives alone Patient's Overall Health Status Rating: very good Typical amount of pain: some Does pain affect daily life?: no Are you currently prescribed opioids?: no  Dietary Habits and Nutritional Risks How many meals a day?: 3 Eats fruit and vegetables daily?: yes Most meals are obtained by: preparing own meals In the last 2 weeks, have you had any of the following?: none Diabetic:: (!) yes Any non-healing wounds?: no How often do you check your BS?: as needed (2x a week) Would you like to be referred to a Nutritionist or for Diabetic Management? : no  Functional Status Activities of Daily Living (to include ambulation/medication): Independent Ambulation: Independent with device- listed below Home Assistive Devices/Equipment: Walker (specify Type); Cane; [place]; Dentures (specify type) Medication Administration: Independent Home Management (perform  basic housework or laundry): Independent Manage your own finances?: yes Primary transportation is: driving Concerns about vision?: no *vision screening is required for WTM* Concerns about hearing?: no  Fall Screening Falls in the past year?: 0 Number of falls in past year: 0 Was there an injury with Fall?: 0 Fall Risk Category Calculator: 0 Patient Fall Risk Level: Low Fall Risk  Fall Risk Patient at Risk for Falls Due to: No Fall Risks Fall risk Follow up: Falls evaluation completed; Falls prevention discussed  Home and Transportation Safety: All rugs have non-skid backing?: (!) no All stairs or steps have railings?: N/A, no stairs Grab bars in the bathtub or shower?: yes Have non-skid surface in bathtub or shower?: yes Good home lighting?: yes Regular seat belt use?: yes Hospital stays in the last year:: no  Cognitive Assessment Difficulty concentrating, remembering, or making decisions? : no Will 6CIT or Mini Cog be Completed: yes What year is it?: 0 points What month is it?: 0 points Give patient an address phrase to remember (5 components): [address] [place], [place] About what time is it?: 0 points Count backwards from 20 to 1: 0 points Say the months of the year in reverse: 0 points Repeat the address phrase from earlier: 0 points 6 CIT Score: 0 points  Advance Directives (For Healthcare) Does Patient Have a Medical Advance Directive?: No Would patient like information on creating a medical advance directive?: No - Patient declined  Reviewed/Updated  Reviewed/Updated: Reviewed All (Medical, Surgical, Family, Medications, Allergies, Care Teams, Patient Goals)    Allergies (verified) Codeine phosphate   Current Medications (verified) Outpatient Encounter Medications as of 06/13/2024  Medication Sig   Ascorbic Acid (VITAMIN C) 1000 MG tablet Take 1,000 mg by mouth daily.   aspirin 81 MG tablet Take 81 mg by mouth daily.  benzonatate  (TESSALON  PERLES) 100  MG capsule 1-2 tab by mouth every 8 hrs as needed for cough   bisoprolol -hydrochlorothiazide  (ZIAC ) 2.5-6.25 MG tablet TAKE 1 TABLET BY MOUTH DAILY   Blood Glucose Monitoring Suppl (ONETOUCH VERIO FLEX SYSTEM) w/Device KIT Use as instructed to check blood sugar once daily. DX:E11.9   Cholecalciferol (VITAMIN D3) 1000 UNITS CAPS Take 1 capsule by mouth daily.   DEXILANT  60 MG capsule TAKE ONE CAPSULE BY MOUTH DAILY   dicyclomine  (BENTYL ) 10 MG capsule TAKE 2 CAPSULES BY MOUTH 3 TIMES DAILY BEFORE MEALS   fexofenadine  (ALLEGRA ) 180 MG tablet Take 1 tablet (180 mg total) by mouth daily. (Patient taking differently: Take 180 mg by mouth daily. As needed)   gabapentin  (NEURONTIN ) 100 MG capsule Take 1-2 tabs by mouth three times per day as needed   glucose blood (ONETOUCH VERIO) test strip USE 1 STRIP TO CHECK GLUCOSE ONCE DAILY   Lancets MISC Use as directed once dialy E11.9   losartan  (COZAAR ) 50 MG tablet TAKE 1 TABLET BY MOUTH DAILY   metFORMIN  (GLUCOPHAGE ) 500 MG tablet TAKE 1 TABLET BY MOUTH DAILY  WITH BREAKFAST   Multiple Vitamin (MULTIVITAMIN) tablet Take 1 tablet by mouth daily.   pioglitazone  (ACTOS ) 15 MG tablet TAKE 1 TABLET BY MOUTH DAILY   potassium chloride  (KLOR-CON ) 10 MEQ tablet Take 1 tablet by mouth once daily   pravastatin  (PRAVACHOL ) 20 MG tablet TAKE 1 TABLET BY MOUTH DAILY   verapamil  (CALAN -SR) 240 MG CR tablet TAKE 1 TABLET BY MOUTH AT  BEDTIME   No facility-administered encounter medications on file as of 06/13/2024.    History: Past Medical History:  Diagnosis Date   Anemia    long ago   Arthritis    Diabetes mellitus    Diverticulosis of colon (without mention of hemorrhage)    Elevated cholesterol    Fibroid    Gastritis    GERD (gastroesophageal reflux disease)    Hiatal hernia    Hypertension    IBS (irritable bowel syndrome)    Positive ANA (antinuclear antibody) 04/23/2012   Stricture and stenosis of esophagus 09/03/2005   Urinary incontinence     Past Surgical History:  Procedure Laterality Date   ABDOMINAL HYSTERECTOMY  1985   BACK SURGERY  11/2016   CHOLECYSTECTOMY     COLONOSCOPY  12/15/2007   diverticulosis   ESOPHAGOGASTRODUODENOSCOPY  12/24/2012   normal    KNEE SURGERY Right    arthroscopic   UPPER GASTROINTESTINAL ENDOSCOPY     Family History  Problem Relation Age of Onset   Diabetes Mother    Hypertension Mother    Other Father        on the job accident   Stroke Brother        x 2   Bipolar disorder Brother    Stroke Brother    Diabetes Brother    Breast cancer Maternal Aunt        Age 37's   Colon cancer Neg Hx    Stomach cancer Neg Hx    Throat cancer Neg Hx    Liver disease Neg Hx    Colon polyps Neg Hx    Esophageal cancer Neg Hx    Rectal cancer Neg Hx    Social History   Occupational History   Occupation: Retired  Tobacco Use   Smoking status: Never   Smokeless tobacco: Never  Vaping Use   Vaping status: Never Used  Substance and Sexual Activity   Alcohol use:  No    Alcohol/week: 0.0 standard drinks of alcohol   Drug use: No   Sexual activity: Not Currently    Birth control/protection: Surgical    Comment: 1ST intercourse- 17, partners- 4, hysterectomy   Tobacco Counseling Counseling given: No  SDOH Screenings   Food Insecurity: Food Insecurity Present (06/13/2024)  Housing: Unknown (06/13/2024)  Transportation Needs: No Transportation Needs (06/13/2024)  Utilities: Not At Risk (06/13/2024)  Alcohol Screen: Low Risk (02/13/2023)  Depression (PHQ2-9): Low Risk (06/13/2024)  Financial Resource Strain: Medium Risk (06/13/2024)  Physical Activity: Inactive (06/13/2024)  Social Connections: Moderately Integrated (06/13/2024)  Stress: No Stress Concern Present (06/13/2024)  Tobacco Use: Low Risk (06/13/2024)  Health Literacy: Adequate Health Literacy (06/13/2024)   See flowsheets for full screening details  Depression Screen PHQ 2 & 9 Depression Scale- Over the past 2 weeks,  how often have you been bothered by any of the following problems? Little interest or pleasure in doing things: 0 Feeling down, depressed, or hopeless (PHQ Adolescent also includes...irritable): 0 PHQ-2 Total Score: 0 Trouble falling or staying asleep, or sleeping too much: 0 Feeling tired or having little energy: 0 Poor appetite or overeating (PHQ Adolescent also includes...weight loss): 0 Feeling bad about yourself - or that you are a failure or have let yourself or your family down: 0 Trouble concentrating on things, such as reading the newspaper or watching television (PHQ Adolescent also includes...like school work): 0 Moving or speaking so slowly that other people could have noticed. Or the opposite - being so fidgety or restless that you have been moving around a lot more than usual: 2 (uses a cane sometimes) Thoughts that you would be better off dead, or of hurting yourself in some way: 0 PHQ-9 Total Score: 2 If you checked off any problems, how difficult have these problems made it for you to do your work, take care of things at home, or get along with other people?: Not difficult at all  Depression Treatment Depression Interventions/Treatment : EYV7-0 Score <4 Follow-up Not Indicated     Goals Addressed               This Visit's Progress     Patient Stated (pt-stated)        Patient stated she plans to continue to monitor her back pain and watch her diet             Objective:    Today's Vitals   06/13/24 1512  Weight: 201 lb (91.2 kg)  Height: 5' 5 (1.651 m)   Body mass index is 33.45 kg/m.  Hearing/Vision screen Hearing Screening - Comments:: Denies hearing difficulties   Vision Screening - Comments:: Wears rx glasses - up to date with routine eye exams with [facility] Immunizations and Health Maintenance Health Maintenance  Topic Date Due   Diabetic kidney evaluation - Urine ACR  Never done   Cervical Cancer Screening (Pap smear)  04/25/2019    DTaP/Tdap/Td (2 - Td or Tdap) 04/27/2023   OPHTHALMOLOGY EXAM  03/15/2024   FOOT EXAM  07/09/2024   HEMOGLOBIN A1C  07/09/2024   Diabetic kidney evaluation - eGFR measurement  01/06/2025   Medicare Annual Wellness (AWV)  06/13/2025   Pneumococcal Vaccine: 50+ Years  Completed   Influenza Vaccine  Completed   Bone Density Scan  Completed   Hepatitis C Screening  Completed   Zoster Vaccines- Shingrix  Completed   Meningococcal B Vaccine  Aged Out   Mammogram  Discontinued   COVID-19  Vaccine  Discontinued        Assessment/Plan:  This is a routine wellness examination for Daisy Dillon.  Patient Care Team: Norleen Lynwood ORN, MD as PCP - [facility]  I have personally reviewed and noted the following in the patients chart:   Medical and social history Use of alcohol, tobacco or illicit drugs  Current medications and supplements including opioid prescriptions. Functional ability and status Nutritional status Physical activity Advanced directives List of other physicians Hospitalizations, surgeries, and ER visits in previous 12 months Vitals Screenings to include cognitive, depression, and falls Referrals and appointments  No orders of the defined types were placed in this encounter.  In addition, I have reviewed and discussed with patient certain preventive protocols, quality metrics, and best practice recommendations. A written personalized care plan for preventive services as well as general preventive health recommendations were provided to patient.   Daisy Dillon, CMA   06/13/2024   Return in 1 year (on 06/13/2025).  After Visit Summary: (MyChart) Due to this being a telephonic visit, the after visit summary with patients personalized plan was offered to patient via MyChart   Nurse Notes: scheduled 6-mth f/u appt w/PCP; scheduled 2026 AWV appt "

## 2024-06-13 NOTE — Patient Instructions (Signed)
 Ms. Vangorden,  Thank you for taking the time for your Medicare Wellness Visit. I appreciate your continued commitment to your health goals. Please review the care plan we discussed, and feel free to reach out if I can assist you further.  Please note that Annual Wellness Visits do not include a physical exam. Some assessments may be limited, especially if the visit was conducted virtually. If needed, we may recommend an in-person follow-up with your provider.  Ongoing Care Seeing your primary care provider every 3 to 6 months helps us  monitor your health and provide consistent, personalized care.   Referrals If a referral was made during today's visit and you haven't received any updates within two weeks, please contact the referred provider directly to check on the status.  Recommended Screenings:  Health Maintenance  Topic Date Due   Yearly kidney health urinalysis for diabetes  Never done   Pap Smear  04/25/2019   DTaP/Tdap/Td vaccine (2 - Td or Tdap) 04/27/2023   Eye exam for diabetics  03/15/2024   Complete foot exam   07/09/2024   Hemoglobin A1C  07/09/2024   Yearly kidney function blood test for diabetes  01/06/2025   Medicare Annual Wellness Visit  06/13/2025   Pneumococcal Vaccine for age over 87  Completed   Flu Shot  Completed   Osteoporosis screening with Bone Density Scan  Completed   Hepatitis C Screening  Completed   Zoster (Shingles) Vaccine  Completed   Meningitis B Vaccine  Aged Out   Breast Cancer Screening  Discontinued   COVID-19 Vaccine  Discontinued       02/13/2023   11:25 AM  Advanced Directives  Does Patient Have a Medical Advance Directive? Yes  Type of Estate Agent of [place];Living will  Copy of Healthcare Power of Attorney in Chart? No - copy requested    Vision: Annual vision screenings are recommended for early detection of glaucoma, cataracts, and diabetic retinopathy. These exams can also reveal signs of chronic  conditions such as diabetes and high blood pressure.  Dental: Annual dental screenings help detect early signs of oral cancer, gum disease, and other conditions linked to overall health, including heart disease and diabetes.

## 2024-06-13 NOTE — Telephone Encounter (Signed)
Ok letter done per [employer] to pt

## 2024-06-14 NOTE — Telephone Encounter (Signed)
 Called and let Pt know

## 2024-06-19 ENCOUNTER — Other Ambulatory Visit: Payer: Self-pay | Admitting: Internal Medicine

## 2024-06-19 DIAGNOSIS — E7800 Pure hypercholesterolemia, unspecified: Secondary | ICD-10-CM

## 2024-07-12 ENCOUNTER — Encounter: Payer: Self-pay | Admitting: Internal Medicine

## 2024-07-12 ENCOUNTER — Ambulatory Visit: Admitting: Internal Medicine

## 2024-07-12 ENCOUNTER — Ambulatory Visit: Payer: Self-pay | Admitting: Internal Medicine

## 2024-07-12 VITALS — BP 136/80 | HR 87 | Temp 98.5°F | Ht 65.0 in | Wt 200.0 lb

## 2024-07-12 DIAGNOSIS — Z7984 Long term (current) use of oral hypoglycemic drugs: Secondary | ICD-10-CM

## 2024-07-12 DIAGNOSIS — R42 Dizziness and giddiness: Secondary | ICD-10-CM

## 2024-07-12 DIAGNOSIS — E7800 Pure hypercholesterolemia, unspecified: Secondary | ICD-10-CM | POA: Diagnosis not present

## 2024-07-12 DIAGNOSIS — E1122 Type 2 diabetes mellitus with diabetic chronic kidney disease: Secondary | ICD-10-CM

## 2024-07-12 DIAGNOSIS — Z0001 Encounter for general adult medical examination with abnormal findings: Secondary | ICD-10-CM

## 2024-07-12 DIAGNOSIS — Z0000 Encounter for general adult medical examination without abnormal findings: Secondary | ICD-10-CM

## 2024-07-12 DIAGNOSIS — N1831 Chronic kidney disease, stage 3a: Secondary | ICD-10-CM

## 2024-07-12 DIAGNOSIS — I10 Essential (primary) hypertension: Secondary | ICD-10-CM

## 2024-07-12 LAB — HEPATIC FUNCTION PANEL
ALT: 40 U/L — ABNORMAL HIGH (ref 3–35)
AST: 37 U/L (ref 5–37)
Albumin: 4.2 g/dL (ref 3.5–5.2)
Alkaline Phosphatase: 106 U/L (ref 39–117)
Bilirubin, Direct: 0 mg/dL — ABNORMAL LOW (ref 0.1–0.3)
Total Bilirubin: 0.3 mg/dL (ref 0.2–1.2)
Total Protein: 8.3 g/dL (ref 6.0–8.3)

## 2024-07-12 LAB — BASIC METABOLIC PANEL WITH GFR
BUN: 10 mg/dL (ref 6–23)
CO2: 26 meq/L (ref 19–32)
Calcium: 10.1 mg/dL (ref 8.4–10.5)
Chloride: 101 meq/L (ref 96–112)
Creatinine, Ser: 1.09 mg/dL (ref 0.40–1.20)
GFR: 48.34 mL/min — ABNORMAL LOW
Glucose, Bld: 81 mg/dL (ref 70–99)
Potassium: 3.7 meq/L (ref 3.5–5.1)
Sodium: 138 meq/L (ref 135–145)

## 2024-07-12 LAB — MICROALBUMIN / CREATININE URINE RATIO
Creatinine,U: 44.9 mg/dL
Microalb Creat Ratio: UNDETERMINED mg/g (ref 0.0–30.0)
Microalb, Ur: <0.7 mg/dL

## 2024-07-12 LAB — HEMOGLOBIN A1C: Hgb A1c MFr Bld: 5.7 % (ref 4.6–6.5)

## 2024-07-12 LAB — URINALYSIS, ROUTINE W REFLEX MICROSCOPIC
Bilirubin Urine: NEGATIVE
Hgb urine dipstick: NEGATIVE
Ketones, ur: NEGATIVE
Leukocytes,Ua: NEGATIVE
Nitrite: NEGATIVE
RBC / HPF: NONE SEEN
Specific Gravity, Urine: <=1.005 — AB (ref 1.000–1.030)
Total Protein, Urine: NEGATIVE
Urine Glucose: NEGATIVE
Urobilinogen, UA: 0.2 (ref 0.0–1.0)
WBC, UA: NONE SEEN
pH: 5.5 (ref 5.0–8.0)

## 2024-07-12 LAB — LIPID PANEL
Cholesterol: 136 mg/dL (ref 28–200)
HDL: 56.6 mg/dL
LDL Cholesterol: 57 mg/dL (ref 10–99)
NonHDL: 79.79
Total CHOL/HDL Ratio: 2
Triglycerides: 114 mg/dL (ref 10.0–149.0)
VLDL: 22.8 mg/dL (ref 0.0–40.0)

## 2024-07-12 LAB — CBC WITH DIFFERENTIAL/PLATELET
Basophils Absolute: 0 10*3/uL (ref 0.0–0.1)
Basophils Relative: 0.6 % (ref 0.0–3.0)
Eosinophils Absolute: 0 10*3/uL (ref 0.0–0.7)
Eosinophils Relative: 0.9 % (ref 0.0–5.0)
HCT: 41.5 % (ref 36.0–46.0)
Hemoglobin: 13.9 g/dL (ref 12.0–15.0)
Lymphocytes Relative: 35.7 % (ref 12.0–46.0)
Lymphs Abs: 1.1 10*3/uL (ref 0.7–4.0)
MCHC: 33.4 g/dL (ref 30.0–36.0)
MCV: 87 fl (ref 78.0–100.0)
Monocytes Absolute: 0.2 10*3/uL (ref 0.1–1.0)
Monocytes Relative: 6.3 % (ref 3.0–12.0)
Neutro Abs: 1.7 10*3/uL (ref 1.4–7.7)
Neutrophils Relative %: 56.5 % (ref 43.0–77.0)
Platelets: 208 10*3/uL (ref 150.0–400.0)
RBC: 4.78 Mil/uL (ref 3.87–5.11)
RDW: 14.2 % (ref 11.5–15.5)
WBC: 3 10*3/uL — ABNORMAL LOW (ref 4.0–10.5)

## 2024-07-12 LAB — TSH: TSH: 1.41 u[IU]/mL (ref 0.35–5.50)

## 2024-07-12 MED ORDER — GABAPENTIN 100 MG PO CAPS: ORAL_CAPSULE | ORAL | 5 refills | Status: AC

## 2024-07-12 MED ORDER — MECLIZINE HCL 12.5 MG PO TABS: 12.5000 mg | ORAL_TABLET | Freq: Three times a day (TID) | ORAL | 1 refills | Status: AC | PRN

## 2024-07-12 NOTE — Assessment & Plan Note (Signed)
 BP Readings from Last 3 Encounters:  07/12/24 136/80  04/13/24 122/80  01/07/24 134/76   Stable, pt to continue medical treatment losartan  50 mg every day, calan  sr 240 qd

## 2024-07-12 NOTE — Assessment & Plan Note (Signed)
 Lab Results  Component Value Date   LDLCALC 57 07/12/2024   Stable, pt to continue current statin pravachol  20 mg qd

## 2024-07-12 NOTE — Patient Instructions (Signed)
 Please take all new medication as prescribed - the meclizine  as needed for vertigo  Please continue all other medications as before, and refills have been done if requested.  Please have the pharmacy call with any other refills you may need.  Please continue your efforts at being more active, low cholesterol diet, and weight control.  You are otherwise up to date with prevention measures today.  Please keep your appointments with your specialists as you may have planned  Please go to the LAB at the blood drawing area for the tests to be done  You will be contacted by phone if any changes need to be made immediately.  Otherwise, you will receive a letter about your results with an explanation, but please check with MyChart first.  Please make an Appointment to return in 6 months, or sooner if needed

## 2024-07-12 NOTE — Progress Notes (Signed)
 Patient ID: Daisy Dillon, female   DOB: [birth year], 79 y.o.   MRN: [id number]         Chief Complaint:: wellness exam and vertigo, dm with ckd3a,  hld, htn       HPI:  Daisy Dillon is a 79 y.o. female here for wellness exam, o/w up to date                        Also Pt denies chest pain, increased sob or doe, wheezing, orthopnea, PND, increased LE swelling, palpitations, or syncope except she has had frequent positional vertigo with head movement in the past wk.   Pt denies polydipsia, polyuria, or new focal neuro s/s.    Pt denies fever, wt loss, night sweats, loss of appetite, or other constitutional symptoms     Wt Readings from Last 3 Encounters:  07/12/24 200 lb (90.7 kg)  06/13/24 201 lb (91.2 kg)  04/13/24 201 lb (91.2 kg)   BP Readings from Last 3 Encounters:  07/12/24 136/80  04/13/24 122/80  01/07/24 134/76   Immunization History  Administered Date(s) Administered   DTaP 06/16/2022   Fluad Quad(high Dose 65+) 04/21/2019, 03/26/2020, 04/20/2023, 04/04/2024   Influenza Split 04/10/2011   Influenza,inj,Quad PF,6+ Mos 04/12/2012, 04/13/2013, 04/17/2014, 04/19/2015, 04/22/2016, 04/24/2017, 06/10/2018   Influenza-Unspecified 05/07/2021, 04/11/2022   PFIZER(Purple Top)SARS-COV-2 Vaccination 08/09/2019, 08/29/2019, 04/23/2020, 10/22/2020, 04/11/2022   Pneumococcal Conjugate-13 04/26/2013   Pneumococcal Polysaccharide-23 05/08/2015   Tdap 04/26/2013   Zoster Recombinant(Shingrix) 07/27/2021, 10/07/2021   Health Maintenance Due  Topic Date Due   Cervical Cancer Screening (Pap smear)  04/25/2019      Past Medical History:  Diagnosis Date   Anemia    long ago   Arthritis    Diabetes mellitus    Diverticulosis of colon (without mention of hemorrhage)    Elevated cholesterol    Fibroid    Gastritis    GERD (gastroesophageal reflux disease)    Hiatal hernia    Hypertension    IBS (irritable bowel syndrome)    Positive ANA (antinuclear antibody)  04/23/2012   Stricture and stenosis of esophagus 09/03/2005   Urinary incontinence    Past Surgical History:  Procedure Laterality Date   ABDOMINAL HYSTERECTOMY  1985   BACK SURGERY  11/2016   CHOLECYSTECTOMY     COLONOSCOPY  12/15/2007   diverticulosis   ESOPHAGOGASTRODUODENOSCOPY  12/24/2012   normal    KNEE SURGERY Right    arthroscopic   UPPER GASTROINTESTINAL ENDOSCOPY      reports that she has never smoked. She has never used smokeless tobacco. She reports that she does not drink alcohol and does not use drugs. family history includes Bipolar disorder in her brother; Breast cancer in her maternal aunt; Diabetes in her brother and mother; Hypertension in her mother; Other in her father; Stroke in her brother and brother. Allergies[1] Medications Ordered Prior to Encounter[2]      ROS:  All others reviewed and negative.  Objective        PE:  BP 136/80 (BP Location: Right Arm, Patient Position: Sitting, Cuff Size: Normal)   Pulse 87   Temp 98.5 F (36.9 C) (Oral)   Ht 5' 5 (1.651 m)   Wt 200 lb (90.7 kg)   SpO2 99%   BMI 33.28 kg/m                 Constitutional: Pt appears in NAD  HENT: Head: NCAT.                Right Ear: External ear normal.                 Left Ear: External ear normal.                Eyes: . Pupils are equal, round, and reactive to light. Conjunctivae and EOM are normal               Nose: without d/c or deformity               Neck: Neck supple. Gross normal ROM               Cardiovascular: Normal rate and regular rhythm.                 Pulmonary/Chest: Effort normal and breath sounds without rales or wheezing.                Abd:  Soft, NT, ND, + BS, no organomegaly               Neurological: Pt is alert. At baseline orientation, motor grossly intact               Skin: Skin is warm. No rashes, no other new lesions, LE edema - none               Psychiatric: Pt behavior is normal without agitation   Micro: none  Cardiac  tracings I have personally interpreted today:  none  Pertinent Radiological findings (summarize): none   Lab Results  Component Value Date   WBC 3.0 (L) 07/12/2024   HGB 13.9 07/12/2024   HCT 41.5 07/12/2024   PLT 208.0 07/12/2024   GLUCOSE 81 07/12/2024   CHOL 136 07/12/2024   TRIG 114.0 07/12/2024   HDL 56.60 07/12/2024   LDLCALC 57 07/12/2024   ALT 40 (H) 07/12/2024   AST 37 07/12/2024   NA 138 07/12/2024   K 3.7 07/12/2024   CL 101 07/12/2024   CREATININE 1.09 07/12/2024   BUN 10 07/12/2024   CO2 26 07/12/2024   TSH 1.41 07/12/2024   INR 1.1 (H) 01/07/2024   HGBA1C 5.7 07/12/2024   MICROALBUR <0.7 07/12/2024   Assessment/Plan:  Daisy Dillon is a 79 y.o. Black or African American [2] female with  has a past medical history of Anemia, Arthritis, Diabetes mellitus, Diverticulosis of colon (without mention of hemorrhage), Elevated cholesterol, Fibroid, Gastritis, GERD (gastroesophageal reflux disease), Hiatal hernia, Hypertension, IBS (irritable bowel syndrome), Positive ANA (antinuclear antibody) (04/23/2012), Stricture and stenosis of esophagus (09/03/2005), and Urinary incontinence.  Encounter for well adult exam with abnormal findings Age and sex appropriate education and counseling updated with regular exercise and diet Referrals for preventative services - none needed Immunizations addressed - none needed Smoking counseling  - none needed Evidence for depression or other mood disorder - none significant Most recent labs reviewed. I have personally reviewed and have noted: 1) the patient's medical and social history 2) The patient's current medications and supplements 3) The patient's height, weight, and BMI have been recorded in the chart   Diabetes [facility]) Ckd3a  Lab Results  Component Value Date   HGBA1C 5.7 07/12/2024   Stable, pt to continue current medical treatment metformin  500 mg every day, actos  15 mg qd   Hyperlipidemia Lab Results   Component Value Date   LDLCALC 57 07/12/2024   Stable, pt  to continue current statin pravachol  20 mg qd   Hypertension BP Readings from Last 3 Encounters:  07/12/24 136/80  04/13/24 122/80  01/07/24 134/76   Stable, pt to continue medical treatment losartan  50 mg every day, calan  sr 240 qd   Vertigo Mild to mod c/w BPPV,, for meclizine  prn, to f/u any worsening symptoms or concerns  Followup: Return in about 6 months (around 01/09/2025).  Lynwood Rush, MD 07/12/2024 7:30 PM [facility]     [1]  Allergies Allergen Reactions   Codeine Phosphate     REACTION: unspecified, nausea, vomiting  [2]  Current Outpatient Medications on File Prior to Visit  Medication Sig Dispense Refill   Ascorbic Acid (VITAMIN C) 1000 MG tablet Take 1,000 mg by mouth daily.     aspirin 81 MG tablet Take 81 mg by mouth daily.     benzonatate  (TESSALON  PERLES) 100 MG capsule 1-2 tab by mouth every 8 hrs as needed for cough 60 capsule 1   bisoprolol -hydrochlorothiazide  (ZIAC ) 2.5-6.25 MG tablet TAKE 1 TABLET BY MOUTH DAILY 100 tablet 2   Blood Glucose Monitoring Suppl (ONETOUCH VERIO FLEX SYSTEM) w/Device KIT Use as instructed to check blood sugar once daily. DX:E11.9 1 kit 0   Cholecalciferol (VITAMIN D3) 1000 UNITS CAPS Take 1 capsule by mouth daily.     DEXILANT  60 MG capsule TAKE ONE CAPSULE BY MOUTH DAILY 90 capsule 2   dicyclomine  (BENTYL ) 10 MG capsule TAKE 2 CAPSULES BY MOUTH 3 TIMES DAILY BEFORE MEALS 540 capsule 0   glucose blood (ONETOUCH VERIO) test strip USE 1 STRIP TO CHECK GLUCOSE ONCE DAILY 100 each 2   Lancets MISC Use as directed once dialy E11.9 100 each 3   losartan  (COZAAR ) 50 MG tablet TAKE 1 TABLET BY MOUTH DAILY 100 tablet 2   metFORMIN  (GLUCOPHAGE ) 500 MG tablet TAKE 1 TABLET BY MOUTH DAILY  WITH BREAKFAST 100 tablet 2   Multiple Vitamin (MULTIVITAMIN) tablet Take 1 tablet by mouth daily.     pioglitazone   (ACTOS ) 15 MG tablet TAKE 1 TABLET BY MOUTH DAILY 100 tablet 2   potassium chloride  (KLOR-CON ) 10 MEQ tablet Take 1 tablet by mouth once daily 90 tablet 0   pravastatin  (PRAVACHOL ) 20 MG tablet TAKE 1 TABLET BY MOUTH DAILY 100 tablet 2   verapamil  (CALAN -SR) 240 MG CR tablet TAKE 1 TABLET BY MOUTH AT  BEDTIME 90 tablet 3   fexofenadine  (ALLEGRA ) 180 MG tablet Take 1 tablet (180 mg total) by mouth daily. (Patient taking differently: Take 180 mg by mouth daily. As needed) 30 tablet 2   No current facility-administered medications on file prior to visit.

## 2024-07-12 NOTE — Progress Notes (Signed)
 The test results show that your current treatment is OK, as the tests are stable.  Please continue the same plan.  There is no other need for change of treatment or further evaluation based on these results, at this time.  thanks

## 2024-07-12 NOTE — Assessment & Plan Note (Signed)
 Mild to mod c/w BPPV,, for meclizine  prn, to f/u any worsening symptoms or concerns

## 2024-07-12 NOTE — Assessment & Plan Note (Signed)

## 2024-07-12 NOTE — Assessment & Plan Note (Signed)
 Ckd3a  Lab Results  Component Value Date   HGBA1C 5.7 07/12/2024   Stable, pt to continue current medical treatment metformin  500 mg every day, actos  15 mg qd

## 2025-06-19 ENCOUNTER — Ambulatory Visit
